# Patient Record
Sex: Female | Born: 1985
Health system: Southern US, Community
[De-identification: ages and names within clinical notes are randomized; demographics above are authoritative.]

## PROBLEM LIST (undated history)

## (undated) ENCOUNTER — Inpatient Hospital Stay (HOSPITAL_COMMUNITY): Payer: Self-pay

## (undated) DIAGNOSIS — F32A Depression, unspecified: Secondary | ICD-10-CM

## (undated) DIAGNOSIS — N12 Tubulo-interstitial nephritis, not specified as acute or chronic: Secondary | ICD-10-CM

## (undated) DIAGNOSIS — E119 Type 2 diabetes mellitus without complications: Secondary | ICD-10-CM

## (undated) DIAGNOSIS — N289 Disorder of kidney and ureter, unspecified: Secondary | ICD-10-CM

## (undated) DIAGNOSIS — I10 Essential (primary) hypertension: Secondary | ICD-10-CM

## (undated) DIAGNOSIS — R569 Unspecified convulsions: Secondary | ICD-10-CM

## (undated) HISTORY — PX: CHOLECYSTECTOMY: SHX55

## (undated) HISTORY — PX: DILATION AND CURETTAGE OF UTERUS: SHX78

---

## 2011-03-31 ENCOUNTER — Encounter (HOSPITAL_COMMUNITY): Payer: Self-pay | Admitting: Emergency Medicine

## 2011-03-31 ENCOUNTER — Emergency Department (HOSPITAL_COMMUNITY)
Admission: EM | Admit: 2011-03-31 | Discharge: 2011-03-31 | Disposition: A | Discharge status: Home / Self Care | Payer: Self-pay | Attending: Emergency Medicine | Admitting: Emergency Medicine

## 2011-03-31 DIAGNOSIS — F17200 Nicotine dependence, unspecified, uncomplicated: Secondary | ICD-10-CM | POA: Insufficient documentation

## 2011-03-31 DIAGNOSIS — K089 Disorder of teeth and supporting structures, unspecified: Secondary | ICD-10-CM | POA: Insufficient documentation

## 2011-03-31 DIAGNOSIS — K0889 Other specified disorders of teeth and supporting structures: Secondary | ICD-10-CM

## 2011-03-31 MED ORDER — KETOROLAC TROMETHAMINE 60 MG/2ML IM SOLN
60.0000 mg | Freq: Once | INTRAMUSCULAR | Status: AC
  Administered 2011-03-31: 60 mg via INTRAMUSCULAR
  Filled 2011-03-31: qty 2

## 2011-03-31 MED ORDER — HYDROMORPHONE HCL PF 1 MG/ML IJ SOLN
1.0000 mg | Freq: Once | INTRAMUSCULAR | Status: AC
  Administered 2011-03-31: 1 mg via INTRAMUSCULAR
  Filled 2011-03-31: qty 1

## 2011-03-31 MED ORDER — NAPROXEN 500 MG PO TABS: 500.0000 mg | ORAL_TABLET | Freq: Two times a day (BID) | ORAL | Status: AC

## 2011-03-31 NOTE — Discharge Instructions (Signed)
Please followup with your dentist as soon as possible, make a phone call in the morning to expedite your followup care. Please take Naprosyn twice a day as prescribed in addition to the Vicodin that you are already taking. If you should develop severe swelling fevers or worsening pain call your dentist or return to the emergency department immediately.

## 2011-03-31 NOTE — ED Provider Notes (Signed)
History     CSN: [id number]  Arrival date & time 03/31/11  0143   First MD Initiated Contact with Patient 03/31/11 0202      Chief Complaint  Patient presents with  . Dental Pain    (Consider location/radiation/quality/duration/timing/severity/associated sxs/prior treatment) HPI Comments: 26 year old female with a history of gradual onset of toothache, this is gradually getting worse over the last 4 days it is located in the right lower jaw, it is tender to palpation over the tooth and has pain with chewing but denies fevers chills nausea or vomiting. She denies any swelling of the jaw. Symptoms are persistent, gradually getting worse  Patient is a 26 y.o. female presenting with tooth pain. The history is provided by the patient and a relative.  Dental PainPrimary symptoms do not include fever or sore throat.  Additional symptoms do not include: facial swelling and trouble swallowing.    History reviewed. No pertinent past medical history.  Past Surgical History  Procedure Date  . Cholecystectomy     History reviewed. No pertinent family history.  History  Substance Use Topics  . Smoking status: Current Everyday Smoker    Types: Cigarettes  . Smokeless tobacco: Not on file  . Alcohol Use: Yes     socially    OB History    Grav Para Term Preterm Abortions TAB SAB Ect Mult Living                  Review of Systems  Constitutional: Negative for fever and chills.  HENT: Positive for dental problem. Negative for sore throat, facial swelling, trouble swallowing and voice change.        Toothache  Gastrointestinal: Negative for nausea and vomiting.    Allergies  Review of patient's allergies indicates no known allergies.  Home Medications   Current Outpatient Rx  Name Route Sig Dispense Refill  . HYDROCODONE-ACETAMINOPHEN 5-325 MG PO TABS Oral Take 1 tablet by mouth every 6 (six) hours as needed. pain    . NORGESTIMATE-ETH ESTRADIOL 0.25-35 MG-MCG PO TABS Oral  Take 1 tablet by mouth daily.    Marland Kitchen PENICILLIN V POTASSIUM 500 MG PO TABS Oral Take 500 mg by mouth 4 (four) times daily. Tooth infection    . NAPROXEN 500 MG PO TABS Oral Take 1 tablet (500 mg total) by mouth 2 (two) times daily with a meal. 30 tablet 0    BP 140/82  Pulse 92  Temp(Src) 98.8 F (37.1 C) (Oral)  Resp 22  SpO2 98%  LMP 03/30/2011  Physical Exam  Nursing note and vitals reviewed. Constitutional: She appears well-developed and well-nourished. No distress.  HENT:  Head: Normocephalic and atraumatic.  Mouth/Throat: Oropharynx is clear and moist. No oropharyngeal exudate.       Dental Disease, scattered minimal dental disease, right lower rear molar with tenderness, fracture, no surrounding abscess drainage or erythema, no tenderness under the tongue, normal appearing tongue and tonsils  Eyes: Conjunctivae are normal. No scleral icterus.  Neck: Normal range of motion. Neck supple. No thyromegaly present.  Cardiovascular: Normal rate and regular rhythm.   Pulmonary/Chest: Effort normal and breath sounds normal.       Phonation normal, no change in voice, no respiratory distress  Lymphadenopathy:    She has no cervical adenopathy.  Neurological: She is alert.  Skin: Skin is warm and dry. No rash noted. She is not diaphoretic.    ED Course  Procedures (including critical care time)  Labs Reviewed - No data  to display No results found.   1. Toothache       MDM  Overall the patient is well appearing, has normal vital signs including oxygen level is 98%, temperature of 37.1 Celsius, blood pressure 140/82.  Patient is already taking penicillin and has followup with the dentist on the 28th of this month.  There is no signs of abscess or anything that needs to be surgically drained or manipulated  Medications given in the emergency department:  Hydromorphone 1 mg intramuscular, Toradol 60 mg intramuscular  Discharge Prescriptions include:  #1  Naprosyn        Vida Roller, MD 03/31/11 [phone]

## 2011-03-31 NOTE — ED Notes (Signed)
Patient reports dental pain in right lower jaw states saw dentist and was given vicodin and Pen VK pain is not improved.

## 2011-03-31 NOTE — ED Notes (Signed)
Patient with toothache for last three days.  Patient states pain is mostly on lower jaw on right.

## 2011-11-13 ENCOUNTER — Encounter (HOSPITAL_COMMUNITY): Payer: Self-pay | Admitting: Emergency Medicine

## 2011-11-13 ENCOUNTER — Emergency Department (HOSPITAL_COMMUNITY)
Admission: EM | Admit: 2011-11-13 | Discharge: 2011-11-13 | Disposition: A | Discharge status: Home / Self Care | Payer: Medicaid Other | Source: Home / Self Care | Attending: Family Medicine | Admitting: Family Medicine

## 2011-11-13 DIAGNOSIS — N912 Amenorrhea, unspecified: Secondary | ICD-10-CM

## 2011-11-13 HISTORY — DX: Unspecified convulsions: R56.9

## 2011-11-13 LAB — POCT URINALYSIS DIP (DEVICE)
Bilirubin Urine: NEGATIVE
Hgb urine dipstick: NEGATIVE
Nitrite: NEGATIVE
Protein, ur: NEGATIVE mg/dL
pH: 6.5 (ref 5.0–8.0)

## 2011-11-13 LAB — WET PREP, GENITAL: Trich, Wet Prep: NONE SEEN

## 2011-11-13 NOTE — ED Provider Notes (Signed)
Medical screening examination/treatment/procedure(s) were performed by resident physician or non-physician practitioner and as supervising physician I was immediately available for consultation/collaboration.   KINDL,JAMES DOUGLAS MD.    James D Kindl, MD 11/13/11 2057 

## 2011-11-13 NOTE — ED Notes (Signed)
Reports no period in 2 months, abdominal cramping, no energy, lethargic, nauseated and vomiting.  Vaginal discharge and odor per patient

## 2011-11-13 NOTE — ED Provider Notes (Signed)
History     CSN: [id number]  Arrival date & time 11/13/11  1158   First MD Initiated Contact with Patient 11/13/11 1247      Chief Complaint  Patient presents with  . Exposure to STD    (Consider location/radiation/quality/duration/timing/severity/associated sxs/prior treatment) HPI 26 yo F here with missed cycle one month ago and two months of intermittent fatigue, nausea, emotional lability, and occasional abd cramps.  Reports occasional (2-3 times per month) vaginal odor but no itching, burning, or vaginal lesions.  Sexually active with one partner (female).  Was on OCPs until 3 months ago but stopped because she was having too much nausea and appetite changes.  Currently using no birth control.  Past Medical History  Diagnosis Date  . Seizures   . Kidney failure     Past Surgical History  Procedure Date  . Cholecystectomy     No family history on file.  History  Substance Use Topics  . Smoking status: Current Every Day Smoker    Types: Cigarettes  . Smokeless tobacco: Not on file  . Alcohol Use: Yes     socially    OB History    Grav Para Term Preterm Abortions TAB SAB Ect Mult Living                  Review of Systems  Constitutional: Negative for fever, chills and appetite change.  Respiratory: Negative for shortness of breath.   Gastrointestinal: Positive for nausea and vomiting.  Genitourinary: Negative for dysuria, flank pain, vaginal bleeding, vaginal discharge and vaginal pain.  Musculoskeletal: Negative for back pain.  Skin: Negative for rash.    Allergies  Review of patient's allergies indicates no known allergies.  Home Medications   Current Outpatient Rx  Name Route Sig Dispense Refill  . HYDROCODONE-ACETAMINOPHEN 5-325 MG PO TABS Oral Take 1 tablet by mouth every 6 (six) hours as needed. pain    . NAPROXEN 500 MG PO TABS Oral Take 1 tablet (500 mg total) by mouth 2 (two) times daily with a meal. 30 tablet 0  . NORGESTIMATE-ETH ESTRADIOL  0.25-35 MG-MCG PO TABS Oral Take 1 tablet by mouth daily.    Marland Kitchen PENICILLIN V POTASSIUM 500 MG PO TABS Oral Take 500 mg by mouth 4 (four) times daily. Tooth infection      BP 152/90  Pulse 92  Temp 98.6 F (37 C) (Oral)  Resp 20  SpO2 98%  LMP 09/25/2011  Physical Exam  Constitutional: She appears well-developed and well-nourished. No distress.       Obese  Genitourinary: Vagina normal and uterus normal. There is no rash, tenderness or lesion on the right labia. There is no rash, tenderness or lesion on the left labia. Cervix exhibits no motion tenderness, no discharge and no friability. Right adnexum displays no mass and no tenderness. Left adnexum displays no mass and no tenderness.    ED Course  Procedures (including critical care time)   Labs Reviewed  POCT URINALYSIS DIP (DEVICE)  POCT PREGNANCY, URINE  WET PREP, GENITAL  GC/CHLAMYDIA PROBE AMP, GENITAL   No results found.   1. Amenorrhea following discontinuation of oral contraceptive use       MDM  Pt with amenorrhea following discontinuation of OCPs.  Due to body habitus and negative urine pregnancy test feel most likely cause is PCOS.  No evidence of STI/PID on exam, no evidence of UTI.  Will recommend f/u with PCP about workup for PCOS if amenorrhea continues.  Will  also recommend discussion about other forms of birth control as long as they are not wishing to have a child.        Brent Bulla, MD 11/13/11 1336

## 2011-11-14 LAB — GC/CHLAMYDIA PROBE AMP, GENITAL
Chlamydia, DNA Probe: NEGATIVE
GC Probe Amp, Genital: NEGATIVE

## 2012-05-22 ENCOUNTER — Other Ambulatory Visit: Payer: Self-pay | Admitting: Obstetrics

## 2012-05-22 ENCOUNTER — Encounter: Payer: Self-pay | Admitting: Obstetrics

## 2012-05-22 ENCOUNTER — Ambulatory Visit (INDEPENDENT_AMBULATORY_CARE_PROVIDER_SITE_OTHER): Payer: Medicaid Other | Admitting: Obstetrics

## 2012-05-22 VITALS — BP 136/89 | HR 94 | Temp 98.2°F | Ht 69.5 in | Wt 280.0 lb

## 2012-05-22 DIAGNOSIS — Z113 Encounter for screening for infections with a predominantly sexual mode of transmission: Secondary | ICD-10-CM

## 2012-05-22 DIAGNOSIS — N760 Acute vaginitis: Secondary | ICD-10-CM

## 2012-05-22 DIAGNOSIS — Z01419 Encounter for gynecological examination (general) (routine) without abnormal findings: Secondary | ICD-10-CM

## 2012-05-22 DIAGNOSIS — Z124 Encounter for screening for malignant neoplasm of cervix: Secondary | ICD-10-CM

## 2012-05-22 DIAGNOSIS — Z3009 Encounter for other general counseling and advice on contraception: Secondary | ICD-10-CM

## 2012-05-22 DIAGNOSIS — IMO0001 Reserved for inherently not codable concepts without codable children: Secondary | ICD-10-CM

## 2012-05-22 DIAGNOSIS — Z0000 Encounter for general adult medical examination without abnormal findings: Secondary | ICD-10-CM

## 2012-05-22 DIAGNOSIS — N926 Irregular menstruation, unspecified: Secondary | ICD-10-CM

## 2012-05-22 LAB — POCT URINE PREGNANCY: Preg Test, Ur: NEGATIVE

## 2012-05-22 NOTE — Progress Notes (Signed)
.   Subjective:     Joanne Waller is a 27 y.o. female here for a routine exam.  Current complaints - irregular cycles.   She would also like to be tested for STD.  Personal health questionnaire reviewed: yes.   Gynecologic History Patient's last menstrual period was 03/23/2012. Contraception: none Last Pap: 2 yrs ago. Results were: normal Last mammogram: N/A  Obstetric History OB History   Grav Para Term Preterm Abortions TAB SAB Ect Mult Living                   The following portions of the patient's history were reviewed and updated as appropriate: allergies, current medications, past family history, past medical history, past social history, past surgical history and problem list.  Review of Systems Pertinent items are noted in HPI.    Objective:    General appearance: alert and no distress Breasts: normal appearance, no masses or tenderness Abdomen: normal findings: soft, non-tender Pelvic: cervix normal in appearance, external genitalia normal, no adnexal masses or tenderness, no cervical motion tenderness, uterus normal size, shape, and consistency and vagina normal without discharge    Assessment:    Healthy female exam.  Wants contraception.   Plan:    Education reviewed: safe sex/STD prevention, self breast exams and conraceptive options.. Contraception: Wants Paediatric nurse. Follow up in: 2 weeks. Start Nuva Ring with abstnence and negative UPT.

## 2012-05-22 NOTE — Addendum Note (Signed)
Addended by: Julaine Hua on: 05/22/2012 05:29 PM   Modules accepted: Orders

## 2012-05-23 LAB — PAP IG W/ RFLX HPV ASCU

## 2012-05-23 LAB — HIV ANTIBODY (ROUTINE TESTING W REFLEX): HIV: NONREACTIVE

## 2012-05-23 LAB — HEPATITIS B SURFACE ANTIGEN: Hepatitis B Surface Ag: NEGATIVE

## 2012-05-23 LAB — HEPATITIS C ANTIBODY: HCV Ab: NEGATIVE

## 2012-05-23 LAB — WET PREP BY MOLECULAR PROBE: Trichomonas vaginosis: NEGATIVE

## 2012-05-26 ENCOUNTER — Other Ambulatory Visit: Payer: Self-pay | Admitting: *Deleted

## 2012-05-26 ENCOUNTER — Encounter: Payer: Self-pay | Admitting: *Deleted

## 2012-05-26 ENCOUNTER — Ambulatory Visit: Payer: Self-pay | Admitting: Obstetrics

## 2012-05-26 DIAGNOSIS — N760 Acute vaginitis: Secondary | ICD-10-CM

## 2012-05-26 DIAGNOSIS — B9689 Other specified bacterial agents as the cause of diseases classified elsewhere: Secondary | ICD-10-CM

## 2012-05-26 MED ORDER — TINIDAZOLE 500 MG PO TABS: 1000.0000 mg | ORAL_TABLET | Freq: Every day | ORAL | Status: DC

## 2012-06-05 ENCOUNTER — Ambulatory Visit: Payer: Medicaid Other | Admitting: Obstetrics

## 2012-06-09 ENCOUNTER — Ambulatory Visit: Payer: Medicaid Other | Admitting: Obstetrics

## 2012-06-11 ENCOUNTER — Ambulatory Visit (INDEPENDENT_AMBULATORY_CARE_PROVIDER_SITE_OTHER): Payer: Medicaid Other | Admitting: Obstetrics

## 2012-06-11 ENCOUNTER — Encounter: Payer: Self-pay | Admitting: Obstetrics

## 2012-06-11 VITALS — BP 114/61 | HR 118 | Temp 98.1°F | Ht 70.0 in | Wt 275.0 lb

## 2012-06-11 DIAGNOSIS — IMO0001 Reserved for inherently not codable concepts without codable children: Secondary | ICD-10-CM

## 2012-06-11 DIAGNOSIS — Z309 Encounter for contraceptive management, unspecified: Secondary | ICD-10-CM

## 2012-06-11 DIAGNOSIS — Z3009 Encounter for other general counseling and advice on contraception: Secondary | ICD-10-CM

## 2012-06-11 MED ORDER — ETONOGESTREL-ETHINYL ESTRADIOL 0.12-0.015 MG/24HR VA RING: VAGINAL_RING | VAGINAL | Status: DC

## 2012-06-11 NOTE — Patient Instructions (Signed)
NuvaRing

## 2012-06-11 NOTE — Progress Notes (Signed)
Subjective:    Joanne Waller is a 27 y.o. female who presents for contraception counseling. The patient has no complaints today. The patient is sexually active. Pertinent past medical history: current smoker. Pt is interested in the Nuvaring.   Menstrual History: OB History   Grav Para Term Preterm Abortions TAB SAB Ect Mult Living                  Menarche age: 33 Irregular cycles  Patient's last menstrual period was 03/23/2012.    The following portions of the patient's history were reviewed and updated as appropriate: allergies, current medications, past family history, past medical history, past social history, past surgical history and problem list.  Review of Systems Pertinent items are noted in HPI.   Objective:    . PE:  NEFG           Vagina normal           Cervix normal           Uterus NSSC           Adnexa negative Patient fitted for 1st Nuva Ring.  She then removed  The Nuva Ring and replaced back in normal position.  Assessment:    27 y.o., starting NuvaRing vaginal inserts, no contraindications.   Plan:    All questions answered.  Nuva Ring Rx

## 2012-11-06 ENCOUNTER — Emergency Department: Payer: Self-pay | Admitting: Internal Medicine

## 2012-11-28 ENCOUNTER — Encounter: Payer: Self-pay | Admitting: Obstetrics

## 2013-01-17 ENCOUNTER — Emergency Department: Payer: Self-pay | Admitting: Internal Medicine

## 2013-01-17 LAB — GC/CHLAMYDIA PROBE AMP

## 2013-06-30 ENCOUNTER — Ambulatory Visit: Payer: Medicaid Other | Admitting: Obstetrics

## 2013-08-08 ENCOUNTER — Emergency Department: Payer: Self-pay | Admitting: Emergency Medicine

## 2013-08-08 LAB — COMPREHENSIVE METABOLIC PANEL
ALK PHOS: 76 U/L
ALT: 48 U/L (ref 12–78)
AST: 29 U/L (ref 15–37)
Albumin: 3.4 g/dL (ref 3.4–5.0)
Anion Gap: 6 — ABNORMAL LOW (ref 7–16)
BILIRUBIN TOTAL: 0.2 mg/dL (ref 0.2–1.0)
BUN: 10 mg/dL (ref 7–18)
CALCIUM: 8.7 mg/dL (ref 8.5–10.1)
CHLORIDE: 106 mmol/L (ref 98–107)
Co2: 26 mmol/L (ref 21–32)
Creatinine: 0.75 mg/dL (ref 0.60–1.30)
EGFR (African American): >60
EGFR (Non-African Amer.): >60
Glucose: 116 mg/dL — ABNORMAL HIGH (ref 65–99)
Osmolality: 276 (ref 275–301)
Potassium: 3.7 mmol/L (ref 3.5–5.1)
Sodium: 138 mmol/L (ref 136–145)
TOTAL PROTEIN: 7.7 g/dL (ref 6.4–8.2)

## 2013-08-08 LAB — CBC WITH DIFFERENTIAL/PLATELET
Basophil #: 0.1 10*3/uL (ref 0.0–0.1)
Basophil %: 0.7 %
EOS ABS: 0.1 10*3/uL (ref 0.0–0.7)
Eosinophil %: 1.5 %
HCT: 39.5 % (ref 35.0–47.0)
HGB: 13.2 g/dL (ref 12.0–16.0)
Lymphocyte #: 3.2 10*3/uL (ref 1.0–3.6)
Lymphocyte %: 34.3 %
MCH: 27 pg (ref 26.0–34.0)
MCHC: 33.4 g/dL (ref 32.0–36.0)
MCV: 81 fL (ref 80–100)
MONO ABS: 0.7 x10 3/mm (ref 0.2–0.9)
Monocyte %: 7.9 %
Neutrophil #: 5.2 10*3/uL (ref 1.4–6.5)
Neutrophil %: 55.6 %
Platelet: 307 10*3/uL (ref 150–440)
RBC: 4.88 10*6/uL (ref 3.80–5.20)
RDW: 13.7 % (ref 11.5–14.5)
WBC: 9.3 10*3/uL (ref 3.6–11.0)

## 2013-08-08 LAB — URINALYSIS, COMPLETE
BILIRUBIN, UR: NEGATIVE
BLOOD: NEGATIVE
Glucose,UR: NEGATIVE mg/dL (ref 0–75)
Ketone: NEGATIVE
LEUKOCYTE ESTERASE: NEGATIVE
NITRITE: NEGATIVE
Ph: 5 (ref 4.5–8.0)
Protein: NEGATIVE
RBC,UR: NONE SEEN /HPF (ref 0–5)
Specific Gravity: 1.026 (ref 1.003–1.030)

## 2013-08-08 LAB — LIPASE, BLOOD: Lipase: 127 U/L (ref 73–393)

## 2013-10-05 ENCOUNTER — Encounter: Payer: Self-pay | Admitting: Obstetrics

## 2013-10-05 ENCOUNTER — Other Ambulatory Visit: Payer: Self-pay | Admitting: Obstetrics

## 2013-10-05 ENCOUNTER — Ambulatory Visit (INDEPENDENT_AMBULATORY_CARE_PROVIDER_SITE_OTHER): Payer: Medicaid Other | Admitting: Obstetrics

## 2013-10-05 VITALS — Temp 98.1°F | Ht 70.0 in | Wt 281.0 lb

## 2013-10-05 DIAGNOSIS — Z0000 Encounter for general adult medical examination without abnormal findings: Secondary | ICD-10-CM

## 2013-10-05 DIAGNOSIS — Z113 Encounter for screening for infections with a predominantly sexual mode of transmission: Secondary | ICD-10-CM

## 2013-10-05 DIAGNOSIS — N926 Irregular menstruation, unspecified: Secondary | ICD-10-CM

## 2013-10-05 LAB — POCT URINE PREGNANCY: PREG TEST UR: NEGATIVE

## 2013-10-05 NOTE — Progress Notes (Signed)
Subjective:     Joanne Waller is a 28 y.o. female here for a routine exam.  Current complaints: None.    Personal health questionnaire:  Is patient Ashkenazi Jewish, have a family history of breast and/or ovarian cancer: no Is there a family history of uterine cancer diagnosed at age < 40, gastrointestinal cancer, urinary tract cancer, family member who is a Personnel officer syndrome-associated carrier: no Is the patient overweight and hypertensive, family history of diabetes, personal history of gestational diabetes or PCOS: no Is patient over 26, have PCOS,  family history of premature CHD under age 13, diabetes, smoke, have hypertension or peripheral artery disease:  no At any time, has a partner hit, kicked or otherwise hurt or frightened you?: no Over the past 2 weeks, have you felt down, depressed or hopeless?: no Over the past 2 weeks, have you felt little interest or pleasure in doing things?:no   Gynecologic History No LMP recorded. Contraception: none Last Pap: 2014. Results were: normal Last mammogram: n/a. Results were: n/a  Obstetric History OB History  Gravida Para Term Preterm AB SAB TAB Ectopic Multiple Living  # Outcome Date GA Lbr Len/2nd Weight Sex Delivery Anes PTL Lv  2 SAB         N  1 TRM         Y      Past Medical History  Diagnosis Date  . Seizures   . Kidney failure     Past Surgical History  Procedure Laterality Date  . Cholecystectomy      Current outpatient prescriptions:divalproex (DEPAKOTE) 500 MG DR tablet, Take 500 mg by mouth daily., Disp: , Rfl:  Allergies  Allergen Reactions  . Latex Itching, Swelling and Rash    History  Substance Use Topics  . Smoking status: Current Every Day Smoker -- 0.50 packs/day for 7 years    Types: Cigarettes  . Smokeless tobacco: Never Used  . Alcohol Use: No     Comment: socially    Family History  Problem Relation Age of Onset  . Kidney disease Mother   . Hypertension Mother   . Sickle  cell anemia Father   . Hyperlipidemia Maternal Grandmother       Review of Systems  Constitutional: negative for fatigue and weight loss Respiratory: negative for cough and wheezing Cardiovascular: negative for chest pain, fatigue and palpitations Gastrointestinal: negative for abdominal pain and change in bowel habits Musculoskeletal:negative for myalgias Neurological: negative for gait problems and tremors Behavioral/Psych: negative for abusive relationship, depression Endocrine: negative for temperature intolerance   Genitourinary:negative for abnormal menstrual periods, genital lesions, hot flashes, sexual problems and vaginal discharge Integument/breast: negative for breast lump, breast tenderness, nipple discharge and skin lesion(s)    Objective:       Temp(Src) 98.1 F (36.7 C)  Ht  (1.778 m)  Wt 281 lb (127.461 kg)  BMI 40.32 kg/m2 General:   alert  Skin:   no rash or abnormalities  Lungs:   clear to auscultation bilaterally  Heart:   regular rate and rhythm, S1, S2 normal, no murmur, click, rub or gallop  Breasts:   normal without suspicious masses, skin or nipple changes or axillary nodes  Abdomen:  normal findings: no organomegaly, soft, non-tender and no hernia  Pelvis:  External genitalia: normal general appearance Urinary system: urethral meatus normal and bladder without fullness, nontender Vaginal: normal without tenderness, induration or masses Cervix: normal  appearance Adnexa: normal bimanual exam Uterus: anteverted and non-tender, normal size   Lab Review Urine pregnancy test Labs reviewed yes Radiologic studies reviewed yes    Assessment:    Healthy female exam.    Plan:    F/U 1 year.  No orders of the defined types were placed in this encounter.   Orders Placed This Encounter  Procedures  . WET PREP BY MOLECULAR PROBE  . GC/Chlamydia Probe Amp  . HIV antibody  . Hepatitis B surface antigen  . RPR  . Hepatitis C antibody  .  CBC with Differential  . Comprehensive metabolic panel  . TSH  . HgB A1c  . POCT urine pregnancy

## 2013-10-06 ENCOUNTER — Telehealth: Payer: Self-pay | Admitting: *Deleted

## 2013-10-06 LAB — COMPREHENSIVE METABOLIC PANEL
ALBUMIN: 3.9 g/dL (ref 3.5–5.2)
ALT: 37 U/L — AB (ref 0–35)
AST: 20 U/L (ref 0–37)
Alkaline Phosphatase: 65 U/L (ref 39–117)
BILIRUBIN TOTAL: 0.3 mg/dL (ref 0.2–1.2)
BUN: 9 mg/dL (ref 6–23)
CHLORIDE: 101 meq/L (ref 96–112)
CO2: 27 mEq/L (ref 19–32)
CREATININE: 0.66 mg/dL (ref 0.50–1.10)
Calcium: 9.5 mg/dL (ref 8.4–10.5)
Glucose, Bld: 114 mg/dL — ABNORMAL HIGH (ref 70–99)
Potassium: 4.5 mEq/L (ref 3.5–5.3)
Sodium: 137 mEq/L (ref 135–145)
Total Protein: 7.2 g/dL (ref 6.0–8.3)

## 2013-10-06 LAB — CBC WITH DIFFERENTIAL/PLATELET
BASOS ABS: 0 10*3/uL (ref 0.0–0.1)
Basophils Relative: 0 % (ref 0–1)
EOS ABS: 0.1 10*3/uL (ref 0.0–0.7)
EOS PCT: 2 % (ref 0–5)
HEMATOCRIT: 39.8 % (ref 36.0–46.0)
Hemoglobin: 13.1 g/dL (ref 12.0–15.0)
LYMPHS PCT: 44 % (ref 12–46)
Lymphs Abs: 3.1 10*3/uL (ref 0.7–4.0)
MCH: 25.8 pg — AB (ref 26.0–34.0)
MCHC: 32.9 g/dL (ref 30.0–36.0)
MCV: 78.3 fL (ref 78.0–100.0)
MONO ABS: 0.4 10*3/uL (ref 0.1–1.0)
Monocytes Relative: 6 % (ref 3–12)
Neutro Abs: 3.4 10*3/uL (ref 1.7–7.7)
Neutrophils Relative %: 48 % (ref 43–77)
PLATELETS: 360 10*3/uL (ref 150–400)
RBC: 5.08 MIL/uL (ref 3.87–5.11)
RDW: 13.8 % (ref 11.5–15.5)
WBC: 7.1 10*3/uL (ref 4.0–10.5)

## 2013-10-06 LAB — THYROID PROFILE - CHCC
Free Thyroxine Index: 2.6 (ref 1.4–3.8)
T3 Uptake: 27 % (ref 22.0–35.0)
T4, Total: 9.5 ug/dL (ref 4.5–12.0)

## 2013-10-06 LAB — HIV ANTIBODY (ROUTINE TESTING W REFLEX): HIV: NONREACTIVE

## 2013-10-06 LAB — PAP IG W/ RFLX HPV ASCU

## 2013-10-06 LAB — HEPATITIS B SURFACE ANTIGEN: HEP B S AG: NEGATIVE

## 2013-10-06 LAB — WET PREP BY MOLECULAR PROBE
Candida species: NEGATIVE
GARDNERELLA VAGINALIS: NEGATIVE
Trichomonas vaginosis: NEGATIVE

## 2013-10-06 LAB — HEMOGLOBIN A1C
HEMOGLOBIN A1C: 7.9 % — AB (ref <5.7)
Mean Plasma Glucose: 180 mg/dL — ABNORMAL HIGH (ref <117)

## 2013-10-06 LAB — GC/CHLAMYDIA PROBE AMP
CT Probe RNA: NEGATIVE
GC PROBE AMP APTIMA: NEGATIVE

## 2013-10-06 LAB — RPR

## 2013-10-06 LAB — HEPATITIS C ANTIBODY: HCV Ab: NEGATIVE

## 2013-10-06 LAB — TSH: TSH: 0.336 u[IU]/mL — ABNORMAL LOW (ref 0.350–4.500)

## 2013-10-06 NOTE — Telephone Encounter (Signed)
Patient is calling for her lab results.  

## 2013-10-11 DIAGNOSIS — R030 Elevated blood-pressure reading, without diagnosis of hypertension: Secondary | ICD-10-CM | POA: Insufficient documentation

## 2013-10-11 DIAGNOSIS — K089 Disorder of teeth and supporting structures, unspecified: Secondary | ICD-10-CM | POA: Diagnosis not present

## 2013-10-11 DIAGNOSIS — Z91040 Latex allergy status: Secondary | ICD-10-CM | POA: Insufficient documentation

## 2013-10-11 DIAGNOSIS — Z880 Allergy status to penicillin: Secondary | ICD-10-CM | POA: Insufficient documentation

## 2013-10-11 DIAGNOSIS — F17200 Nicotine dependence, unspecified, uncomplicated: Secondary | ICD-10-CM | POA: Diagnosis not present

## 2013-10-11 DIAGNOSIS — R51 Headache: Secondary | ICD-10-CM | POA: Insufficient documentation

## 2013-10-11 DIAGNOSIS — Z792 Long term (current) use of antibiotics: Secondary | ICD-10-CM | POA: Diagnosis not present

## 2013-10-11 DIAGNOSIS — Z87448 Personal history of other diseases of urinary system: Secondary | ICD-10-CM | POA: Diagnosis not present

## 2013-10-11 DIAGNOSIS — Z791 Long term (current) use of non-steroidal anti-inflammatories (NSAID): Secondary | ICD-10-CM | POA: Diagnosis not present

## 2013-10-11 NOTE — ED Notes (Signed)
Pt states her BP has been up for months and she has a bad headache tonight.  Pt also c/o toothache x a couple of days

## 2013-10-11 NOTE — ED Notes (Signed)
Pt anxious states she took her BP at home using her mother's machine

## 2013-10-12 ENCOUNTER — Emergency Department (HOSPITAL_COMMUNITY)
Admission: EM | Admit: 2013-10-12 | Discharge: 2013-10-12 | Disposition: A | Discharge status: Home / Self Care | Payer: Medicaid Other | Attending: Emergency Medicine | Admitting: Emergency Medicine

## 2013-10-12 DIAGNOSIS — K0889 Other specified disorders of teeth and supporting structures: Secondary | ICD-10-CM

## 2013-10-12 DIAGNOSIS — R030 Elevated blood-pressure reading, without diagnosis of hypertension: Secondary | ICD-10-CM

## 2013-10-12 DIAGNOSIS — R51 Headache: Secondary | ICD-10-CM

## 2013-10-12 DIAGNOSIS — R519 Headache, unspecified: Secondary | ICD-10-CM

## 2013-10-12 MED ORDER — NAPROXEN 375 MG PO TABS: 375.0000 mg | ORAL_TABLET | Freq: Two times a day (BID) | ORAL | Status: DC

## 2013-10-12 MED ORDER — IBUPROFEN 400 MG PO TABS
600.0000 mg | ORAL_TABLET | Freq: Once | ORAL | Status: AC
  Administered 2013-10-12: 600 mg via ORAL
  Filled 2013-10-12 (×2): qty 1

## 2013-10-12 MED ORDER — CLINDAMYCIN HCL 150 MG PO CAPS: 150.0000 mg | ORAL_CAPSULE | Freq: Three times a day (TID) | ORAL | Status: DC

## 2013-10-12 MED ORDER — ACETAMINOPHEN 325 MG PO TABS
650.0000 mg | ORAL_TABLET | Freq: Once | ORAL | Status: AC
  Administered 2013-10-12: 650 mg via ORAL
  Filled 2013-10-12: qty 2

## 2013-10-12 NOTE — ED Provider Notes (Signed)
CSN: [id number]     Arrival date & time 10/11/13  2142 History   First MD Initiated Contact with Patient 10/12/13 [phone]     Chief Complaint  Patient presents with  . Headache  . Hypertension     (Consider location/radiation/quality/duration/timing/severity/associated sxs/prior Treatment) HPI Comments: 28 year old female with smoking history, no other significant medical problems, family history of high blood pressure presents with elevated blood pressure and headache. Patient had gradual onset headache similar to previous, frontal in origin. No head injury, neck stiffness or fevers. Patient has had mild dental pain worsening the past 2-3 days, similar to previous, patient recently had a tooth extracted and requires another were extracted. Patient has dental followup. No swelling in the face or fevers. Patient has had intermittent elevated blood pressure sometimes with headache and sometimes without. Patient is not on any medicines currently and has appointment this week to discuss further. Patient's blood pressure was 170 by her mother's machine this evening.  Patient is a 28 y.o. female presenting with headaches and hypertension. The history is provided by the patient.  Headache Associated symptoms: no abdominal pain, no back pain, no congestion, no fever, no neck pain, no neck stiffness, no numbness and no vomiting   Hypertension Associated symptoms include headaches. Pertinent negatives include no chest pain, no abdominal pain and no shortness of breath.    Past Medical History  Diagnosis Date  . Seizures   . Kidney failure    Past Surgical History  Procedure Laterality Date  . Cholecystectomy     Family History  Problem Relation Age of Onset  . Kidney disease Mother   . Hypertension Mother   . Sickle cell anemia Father   . Hyperlipidemia Maternal Grandmother    History  Substance Use Topics  . Smoking status: Current Every Day Smoker -- 0.50 packs/day for 7 years    Types:  Cigarettes  . Smokeless tobacco: Never Used  . Alcohol Use: No     Comment: socially   OB History   Grav Para Term Preterm Abortions TAB SAB Ect Mult Living   Review of Systems  Constitutional: Negative for fever and chills.  HENT: Positive for dental problem. Negative for congestion and drooling.   Eyes: Negative for visual disturbance.  Respiratory: Negative for shortness of breath.   Cardiovascular: Negative for chest pain.  Gastrointestinal: Negative for vomiting and abdominal pain.  Genitourinary: Negative for dysuria and flank pain.  Musculoskeletal: Negative for back pain, neck pain and neck stiffness.  Skin: Negative for rash.  Neurological: Positive for headaches. Negative for syncope, weakness, light-headedness and numbness.      Allergies  Penicillins and Latex  Home Medications   Prior to Admission medications   Medication Sig Start Date End Date Taking? Authorizing Provider  acetaminophen (TYLENOL) 325 MG tablet Take 1,300 mg by mouth every 6 (six) hours as needed for mild pain.   Yes Historical Provider, MD  ibuprofen (ADVIL,MOTRIN) 200 MG tablet Take 800 mg by mouth every 6 (six) hours as needed for moderate pain.   Yes Historical Provider, MD  naproxen sodium (ANAPROX) 220 MG tablet Take 440 mg by mouth 2 (two) times daily as needed (pain).   Yes Historical Provider, MD  penicillin v potassium (VEETID) 500 MG tablet Take 500 mg by mouth 3 (three) times daily.    Historical Provider, MD   BP 132/76  Pulse 78  Temp(Src) 98.8 F (  37.1 C) (Oral)  Resp 14  Ht  (1.778 m)  Wt 281 lb (127.461 kg)  BMI 40.32 kg/m2  SpO2 97%  LMP 08/12/2013 Physical Exam  Nursing note and vitals reviewed. Constitutional: She is oriented to person, place, and time. She appears well-developed and well-nourished.  HENT:  Head: Normocephalic and atraumatic.  Mild gingival tenderness and tenderness at empty socket left upper, no discharge, swelling or  bleeding. No trismus. No abscess appreciated. Neck supple no meningismus  Eyes: Conjunctivae are normal. Right eye exhibits no discharge. Left eye exhibits no discharge.  Neck: Normal range of motion. Neck supple. No tracheal deviation present.  Cardiovascular: Normal rate and regular rhythm.   Pulmonary/Chest: Effort normal and breath sounds normal.  Abdominal: Soft. She exhibits no distension. There is no tenderness. There is no guarding.  Musculoskeletal: She exhibits no edema.  Neurological: She is alert and oriented to person, place, and time.  5+ strength in UE and LE with f/e at major joints. Sensation to palpation intact in UE and LE. CNs 2-12 grossly intact.  EOMFI.  PERRL.   Finger nose and coordination intact bilateral.   Visual fields intact to finger testing.   Skin: Skin is warm. No rash noted.  Psychiatric: She has a normal mood and affect.    ED Course  Procedures (including critical care time) Labs Review Labs Reviewed - No data to display  Imaging Review No results found.   EKG Interpretation None      MDM   Final diagnoses:  Headache, unspecified headache type  Temporary high blood pressure   Intermittent high blood pressure episodes usually associated with headache. Discussed likely blood pressure elevated due to pain however with family history high blood pressure patient need close followup indicate a log when she's not in pain and not stress. Blood pressure 1:30 systolic. Patient has no signs or symptoms of endorgan damage. Discussed improving diet and exercise and watching salt intake. Dental pain no emergent infection visualized, plan for oral antibiotics and followup with dentist. Headache improved with time, meds given in ER. Normal neuro exam. Gradual onset no red flags  The patients results and plan were reviewed and discussed.   Any x-rays performed were personally reviewed by myself.   Differential diagnosis were considered with the  presenting HPI.  Medications  ibuprofen (ADVIL,MOTRIN) tablet 600 mg (600 mg Oral Given 10/12/13 0139)  acetaminophen (TYLENOL) tablet 650 mg (650 mg Oral Given 10/12/13 0139)     Filed Vitals:   10/11/13 2210 10/12/13 0046 10/12/13 0100  BP: 134/94 135/82 132/76  Pulse: 102 88 78  Temp: 98.8 F (37.1 C)    TempSrc: Oral    Resp: 16 14   Height:  (1.778 m)    Weight: 281 lb (127.461 kg)    SpO2: 99% 99% 97%    Admission/ observation were discussed with the admitting physician, patient and/or family and they are comfortable with the plan.    Enid Skeens, MD 10/12/13 [phone]

## 2013-10-12 NOTE — Discharge Instructions (Signed)
If you were given medicines take as directed.  If you are on coumadin or contraceptives realize their levels and effectiveness is altered by many different medicines.  If you have any reaction (rash, tongues swelling, other) to the medicines stop taking and see a physician.    improve dietary intake and exercise as discussed. Follow closely with her primary Dr. and keep a log of your blood pressure when you're not stressed or in pain. Please follow up as directed and return to the ER or see a physician for new or worsening symptoms.  Thank you. Filed Vitals:   10/11/13 2210 10/12/13 0046 10/12/13 0100  BP: 134/94 135/82 132/76  Pulse: 102 88 78  Temp: 98.8 F (37.1 C)    TempSrc: Oral    Resp: 16 14   Height:  (1.778 m)    Weight: 281 lb (127.461 kg)    SpO2: 99% 99% 97%   See your dentist.

## 2013-10-18 ENCOUNTER — Emergency Department: Payer: Self-pay | Admitting: Emergency Medicine

## 2013-12-07 ENCOUNTER — Encounter: Payer: Self-pay | Admitting: Obstetrics

## 2014-01-27 ENCOUNTER — Emergency Department: Payer: Self-pay | Admitting: Emergency Medicine

## 2014-01-27 LAB — COMPREHENSIVE METABOLIC PANEL
Albumin: 2.9 g/dL — ABNORMAL LOW (ref 3.4–5.0)
Alkaline Phosphatase: 63 U/L
Anion Gap: 7 (ref 7–16)
BUN: 8 mg/dL (ref 7–18)
Bilirubin,Total: 0.3 mg/dL (ref 0.2–1.0)
CHLORIDE: 110 mmol/L — AB (ref 98–107)
Calcium, Total: 8.3 mg/dL — ABNORMAL LOW (ref 8.5–10.1)
Co2: 26 mmol/L (ref 21–32)
Creatinine: 0.76 mg/dL (ref 0.60–1.30)
EGFR (African American): >60
Glucose: 124 mg/dL — ABNORMAL HIGH (ref 65–99)
OSMOLALITY: 285 (ref 275–301)
POTASSIUM: 3.7 mmol/L (ref 3.5–5.1)
SGOT(AST): 28 U/L (ref 15–37)
SGPT (ALT): 51 U/L
Sodium: 143 mmol/L (ref 136–145)
Total Protein: 7.1 g/dL (ref 6.4–8.2)

## 2014-01-27 LAB — CBC WITH DIFFERENTIAL/PLATELET
Basophil #: 0.1 10*3/uL (ref 0.0–0.1)
Basophil %: 0.9 %
Eosinophil #: 0 10*3/uL (ref 0.0–0.7)
Eosinophil %: 0.6 %
HCT: 35.3 % (ref 35.0–47.0)
HGB: 11.4 g/dL — AB (ref 12.0–16.0)
Lymphocyte #: 1.9 10*3/uL (ref 1.0–3.6)
Lymphocyte %: 27.2 %
MCH: 26.2 pg (ref 26.0–34.0)
MCHC: 32.3 g/dL (ref 32.0–36.0)
MCV: 81 fL (ref 80–100)
MONOS PCT: 6.9 %
Monocyte #: 0.5 x10 3/mm (ref 0.2–0.9)
NEUTROS PCT: 64.4 %
Neutrophil #: 4.5 10*3/uL (ref 1.4–6.5)
Platelet: 343 10*3/uL (ref 150–440)
RBC: 4.34 10*6/uL (ref 3.80–5.20)
RDW: 14.2 % (ref 11.5–14.5)
WBC: 7 10*3/uL (ref 3.6–11.0)

## 2014-01-27 LAB — URINALYSIS, COMPLETE
BACTERIA: NONE SEEN
BILIRUBIN, UR: NEGATIVE
Blood: NEGATIVE
Glucose,UR: NEGATIVE mg/dL (ref 0–75)
Ketone: NEGATIVE
LEUKOCYTE ESTERASE: NEGATIVE
NITRITE: NEGATIVE
Ph: 6 (ref 4.5–8.0)
Protein: 30
Specific Gravity: 1.027 (ref 1.003–1.030)
WBC UR: 4 /HPF (ref 0–5)

## 2014-01-27 LAB — PREGNANCY, URINE: PREGNANCY TEST, URINE: NEGATIVE m[IU]/mL

## 2014-01-27 LAB — LIPASE, BLOOD: LIPASE: 186 U/L (ref 73–393)

## 2014-01-27 LAB — ETHANOL

## 2014-09-16 ENCOUNTER — Emergency Department (HOSPITAL_COMMUNITY): Payer: Medicaid Other

## 2014-09-16 ENCOUNTER — Encounter (HOSPITAL_COMMUNITY): Payer: Self-pay | Admitting: Emergency Medicine

## 2014-09-16 ENCOUNTER — Emergency Department (HOSPITAL_COMMUNITY)
Admission: EM | Admit: 2014-09-16 | Discharge: 2014-09-16 | Disposition: A | Discharge status: Home / Self Care | Payer: Medicaid Other | Attending: Emergency Medicine | Admitting: Emergency Medicine

## 2014-09-16 DIAGNOSIS — S199XXA Unspecified injury of neck, initial encounter: Secondary | ICD-10-CM | POA: Diagnosis not present

## 2014-09-16 DIAGNOSIS — Z87448 Personal history of other diseases of urinary system: Secondary | ICD-10-CM | POA: Insufficient documentation

## 2014-09-16 DIAGNOSIS — E119 Type 2 diabetes mellitus without complications: Secondary | ICD-10-CM | POA: Diagnosis not present

## 2014-09-16 DIAGNOSIS — Z720 Tobacco use: Secondary | ICD-10-CM | POA: Diagnosis not present

## 2014-09-16 DIAGNOSIS — M545 Low back pain: Secondary | ICD-10-CM

## 2014-09-16 DIAGNOSIS — S0990XA Unspecified injury of head, initial encounter: Secondary | ICD-10-CM | POA: Insufficient documentation

## 2014-09-16 DIAGNOSIS — Z880 Allergy status to penicillin: Secondary | ICD-10-CM | POA: Diagnosis not present

## 2014-09-16 DIAGNOSIS — M542 Cervicalgia: Secondary | ICD-10-CM

## 2014-09-16 DIAGNOSIS — Y92410 Unspecified street and highway as the place of occurrence of the external cause: Secondary | ICD-10-CM | POA: Diagnosis not present

## 2014-09-16 DIAGNOSIS — Z3202 Encounter for pregnancy test, result negative: Secondary | ICD-10-CM | POA: Insufficient documentation

## 2014-09-16 DIAGNOSIS — Y9389 Activity, other specified: Secondary | ICD-10-CM | POA: Insufficient documentation

## 2014-09-16 DIAGNOSIS — S3992XA Unspecified injury of lower back, initial encounter: Secondary | ICD-10-CM | POA: Insufficient documentation

## 2014-09-16 DIAGNOSIS — I10 Essential (primary) hypertension: Secondary | ICD-10-CM | POA: Insufficient documentation

## 2014-09-16 DIAGNOSIS — Z91040 Latex allergy status: Secondary | ICD-10-CM | POA: Diagnosis not present

## 2014-09-16 DIAGNOSIS — Y998 Other external cause status: Secondary | ICD-10-CM | POA: Insufficient documentation

## 2014-09-16 DIAGNOSIS — E669 Obesity, unspecified: Secondary | ICD-10-CM | POA: Diagnosis not present

## 2014-09-16 HISTORY — DX: Essential (primary) hypertension: I10

## 2014-09-16 HISTORY — DX: Type 2 diabetes mellitus without complications: E11.9

## 2014-09-16 LAB — POC URINE PREG, ED: Preg Test, Ur: NEGATIVE

## 2014-09-16 MED ORDER — METHOCARBAMOL 500 MG PO TABS: 500.0000 mg | ORAL_TABLET | Freq: Two times a day (BID) | ORAL | Status: DC | PRN

## 2014-09-16 MED ORDER — ACETAMINOPHEN 325 MG PO TABS
650.0000 mg | ORAL_TABLET | Freq: Once | ORAL | Status: AC
  Administered 2014-09-16: 650 mg via ORAL
  Filled 2014-09-16: qty 2

## 2014-09-16 MED ORDER — NAPROXEN 250 MG PO TABS: 250.0000 mg | ORAL_TABLET | Freq: Two times a day (BID) | ORAL | Status: DC

## 2014-09-16 MED ORDER — METHOCARBAMOL 500 MG PO TABS
500.0000 mg | ORAL_TABLET | Freq: Once | ORAL | Status: AC
  Administered 2014-09-16: 500 mg via ORAL
  Filled 2014-09-16: qty 1
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
## 2015-01-02 ENCOUNTER — Encounter (HOSPITAL_COMMUNITY): Payer: Self-pay | Admitting: Emergency Medicine

## 2015-01-02 ENCOUNTER — Emergency Department (INDEPENDENT_AMBULATORY_CARE_PROVIDER_SITE_OTHER)
Admission: EM | Admit: 2015-01-02 | Discharge: 2015-01-02 | Disposition: A | Discharge status: Home / Self Care | Payer: Medicaid Other | Source: Home / Self Care | Attending: Emergency Medicine | Admitting: Emergency Medicine

## 2015-01-02 DIAGNOSIS — R6889 Other general symptoms and signs: Secondary | ICD-10-CM

## 2015-01-02 LAB — POCT RAPID STREP A: STREPTOCOCCUS, GROUP A SCREEN (DIRECT): NEGATIVE

## 2015-01-02 MED ORDER — ONDANSETRON HCL 4 MG PO TABS: 4.0000 mg | ORAL_TABLET | Freq: Three times a day (TID) | ORAL | Status: DC | PRN

## 2015-01-02 MED ORDER — FLUTICASONE PROPIONATE 50 MCG/ACT NA SUSP: 2.0000 | Freq: Every day | NASAL | Status: DC

## 2015-01-02 MED ORDER — CETIRIZINE HCL 10 MG PO TABS
10.0000 mg | ORAL_TABLET | Freq: Every day | ORAL | Status: DC
Start: 2015-01-02 — End: 2016-03-20

## 2015-01-02 NOTE — Discharge Instructions (Signed)
You likely have the flu. Takes cetirizine daily to help with the drainage and congestion. Use Flonase daily to help with the drainage and congestion. You can use over-the-counter Afrin for 3 days only to help with the congestion. Take Tylenol or ibuprofen as needed for body aches. Make sure you are drinking plenty of fluids. You should start to feel better over the next 2-3 days. If things are getting worse, please come back.

## 2015-01-02 NOTE — ED Provider Notes (Signed)
CSN: [id number]     Arrival date & time 01/02/15  1856 History   First MD Initiated Contact with Patient 01/02/15 1934     Chief Complaint  Patient presents with  . URI   (Consider location/radiation/quality/duration/timing/severity/associated sxs/prior Treatment) HPI  She is a 29 year old woman here for evaluation of nasal congestion. She states her symptoms started 3 days ago with a headache and sore throat. She quickly developed nasal congestion, rhinorrhea, postnasal drainage. She also reports fevers and chills. She has had body aches. She reports nausea and vomiting. She reports a mild cough. She has been taking DayQuil and NyQuil without improvement.  Past Medical History  Diagnosis Date  . Seizures (HCC)   . Kidney failure   . Diabetes mellitus without complication (HCC)   . Hypertension    Past Surgical History  Procedure Laterality Date  . Cholecystectomy     Family History  Problem Relation Age of Onset  . Kidney disease Mother   . Hypertension Mother   . Sickle cell anemia Father   . Hyperlipidemia Maternal Grandmother    Social History  Substance Use Topics  . Smoking status: Current Every Day Smoker -- 0.50 packs/day for 7 years    Types: Cigarettes  . Smokeless tobacco: Never Used  . Alcohol Use: No     Comment: socially   OB History    Gravida Para Term Preterm AB TAB SAB Ectopic Multiple Living   2 1 1  1  1   1      Review of Systems As in history of present illness Allergies  Penicillins and Latex  Home Medications   Prior to Admission medications   Medication Sig Start Date End Date Taking? Authorizing Provider  HYDROCHLOROTHIAZIDE PO Take by mouth.   Yes Historical Provider, MD  metFORMIN (GLUCOPHAGE) 500 MG tablet Take by mouth 2 (two) times daily with a meal.   Yes Historical Provider, MD  acetaminophen (TYLENOL) 325 MG tablet Take 1,300 mg by mouth every 6 (six) hours as needed for mild pain.    Historical Provider, MD  cetirizine (ZYRTEC)  10 MG tablet Take 1 tablet (10 mg total) by mouth daily. 01/02/15   Charm RingsErin J Mulan Adan, MD  fluticasone (FLONASE) 50 MCG/ACT nasal spray Place 2 sprays into both nostrils daily. 01/02/15   Charm RingsErin J Suellen Durocher, MD  methocarbamol (ROBAXIN) 500 MG tablet Take 1 tablet (500 mg total) by mouth 2 (two) times daily as needed for muscle spasms. 09/16/14   Everlene FarrierWilliam Dansie, PA-C  naproxen (NAPROSYN) 250 MG tablet Take 1 tablet (250 mg total) by mouth 2 (two) times daily with a meal. 09/16/14   Everlene FarrierWilliam Dansie, PA-C   Meds Ordered and Administered this Visit  Medications - No data to display  BP 142/82 mmHg  Pulse 109  Temp(Src) 97.8 F (36.6 C) (Oral)  Resp 18  SpO2 96%  LMP 12/12/2014 No data found.   Physical Exam  Constitutional: She is oriented to person, place, and time. She appears well-developed and well-nourished. No distress.  HENT:  Mouth/Throat: No oropharyngeal exudate.  Oropharynx is erythematous. Nasal mucosa is erythematous and boggy. There is discharge present. TMs normal bilaterally.  Neck: Neck supple.  Cardiovascular: Regular rhythm and normal heart sounds.   No murmur heard. Mild tachycardia  Pulmonary/Chest: Effort normal and breath sounds normal. No respiratory distress. She has no wheezes. She has no rales.  Lymphadenopathy:    She has no cervical adenopathy.  Neurological: She is alert and oriented to person, place,  and time.    ED Course  Procedures (including critical care time)  Labs Review Labs Reviewed  POCT RAPID STREP A    Imaging Review No results found.    MDM   1. Flu-like symptoms    She is outside the window for Tamiflu. Symptomatic treatment with cetirizine, Flonase, and Zofran. Recommended over-the-counter Afrin for no more than 3 days. Return precautions reviewed.    Charm Rings, MD 01/02/15 [phone]

## 2015-01-02 NOTE — ED Notes (Signed)
C/o cold sx onset 4 days Sx include: HA, ST, abd pain emesis, CP, congestion, dyspnea and fevers Taking OTC cold meds w/no relief A&O x4.... No acute distress.

## 2015-01-04 LAB — CULTURE, GROUP A STREP: Strep A Culture: POSITIVE — AB

## 2015-01-04 NOTE — ED Notes (Signed)
Final report of strep testing positive for group A strep, not treated. Message from Dr [facility]iedad [facility]imesHonig [facility], [facility] mg , PO, BID x 10 days .discussed w patient, and will call to [facility], on [address] at [address]. Spoke directly w pharmacy staff

## 2015-01-05 MED ORDER — AZITHROMYCIN 250 MG PO TABS: ORAL_TABLET | ORAL | Status: DC

## 2015-02-23 ENCOUNTER — Ambulatory Visit: Payer: Medicaid Other | Admitting: Obstetrics

## 2015-03-04 ENCOUNTER — Encounter (HOSPITAL_COMMUNITY): Payer: Self-pay | Admitting: Emergency Medicine

## 2015-03-04 ENCOUNTER — Emergency Department (HOSPITAL_COMMUNITY)
Admission: EM | Admit: 2015-03-04 | Discharge: 2015-03-04 | Disposition: A | Discharge status: Home / Self Care | Payer: Medicaid Other | Source: Home / Self Care | Attending: Family Medicine | Admitting: Family Medicine

## 2015-03-04 DIAGNOSIS — K0889 Other specified disorders of teeth and supporting structures: Secondary | ICD-10-CM | POA: Diagnosis not present

## 2015-03-04 MED ORDER — CLINDAMYCIN HCL 300 MG PO CAPS: 300.0000 mg | ORAL_CAPSULE | Freq: Three times a day (TID) | ORAL | Status: DC

## 2015-03-04 MED ORDER — HYDROCODONE-ACETAMINOPHEN 5-325 MG PO TABS: 1.0000 | ORAL_TABLET | ORAL | Status: DC | PRN

## 2015-03-04 NOTE — ED Provider Notes (Signed)
CSN: [id number]     Arrival date & time 03/04/15  1758 History   First MD Initiated Contact with Patient 03/04/15 1838     Chief Complaint  Patient presents with  . Dental Pain   (Consider location/radiation/quality/duration/timing/severity/associated sxs/prior Treatment) HPI Comments: Toothache for 1 day. Located in left upper 3rd molar. Pain radiates into posterior jaw. No trauma.St has appt with dentist in 4 d.  Patient is a 30 y.o. female presenting with tooth pain.  Dental Pain Associated symptoms: no congestion and no fever     Past Medical History  Diagnosis Date  . Seizures (HCC)   . Kidney failure   . Diabetes mellitus without complication (HCC)   . Hypertension    Past Surgical History  Procedure Laterality Date  . Cholecystectomy     Family History  Problem Relation Age of Onset  . Kidney disease Mother   . Hypertension Mother   . Sickle cell anemia Father   . Hyperlipidemia Maternal Grandmother    Social History  Substance Use Topics  . Smoking status: Current Every Day Smoker -- 0.50 packs/day for 7 years    Types: Cigarettes  . Smokeless tobacco: Never Used  . Alcohol Use: No     Comment: socially   OB History    Gravida Para Term Preterm AB TAB SAB Ectopic Multiple Living   Review of Systems  Constitutional: Negative for fever and activity change.  HENT: Positive for dental problem. Negative for congestion, ear pain and sore throat.   Eyes: Negative.   Respiratory: Negative.   Neurological: Negative.   All other systems reviewed and are negative.   Allergies  Penicillins and Latex  Home Medications   Prior to Admission medications   Medication Sig Start Date End Date Taking? Authorizing Provider  acetaminophen (TYLENOL) 325 MG tablet Take 1,300 mg by mouth every 6 (six) hours as needed for mild pain.    Historical Provider, MD  azithromycin (ZITHROMAX Z-PAK) 250 MG tablet Take 2 pills today, then 1 pill daily until  gone. 01/05/15   Charm Rings, MD  cetirizine (ZYRTEC) 10 MG tablet Take 1 tablet (10 mg total) by mouth daily. 01/02/15   Charm Rings, MD  clindamycin (CLEOCIN) 300 MG capsule Take 1 capsule (300 mg total) by mouth 3 (three) times daily. 03/04/15   Hayden Rasmussen, NP  fluticasone (FLONASE) 50 MCG/ACT nasal spray Place 2 sprays into both nostrils daily. 01/02/15   Charm Rings, MD  HYDROCHLOROTHIAZIDE PO Take by mouth.    Historical Provider, MD  HYDROcodone-acetaminophen (NORCO/VICODIN) 5-325 MG tablet Take 1 tablet by mouth every 4 (four) hours as needed. 03/04/15   Hayden Rasmussen, NP  metFORMIN (GLUCOPHAGE) 500 MG tablet Take by mouth 2 (two) times daily with a meal.    Historical Provider, MD  methocarbamol (ROBAXIN) 500 MG tablet Take 1 tablet (500 mg total) by mouth 2 (two) times daily as needed for muscle spasms. 09/16/14   Everlene Farrier, PA-C  naproxen (NAPROSYN) 250 MG tablet Take 1 tablet (250 mg total) by mouth 2 (two) times daily with a meal. 09/16/14   Everlene Farrier, PA-C  ondansetron (ZOFRAN) 4 MG tablet Take 1 tablet (4 mg total) by mouth every 8 (eight) hours as needed for nausea or vomiting. 01/02/15   Charm Rings, MD   Meds Ordered and Administered this Visit  Medications - No data to display  BP  118/77 mmHg  Pulse 100  Temp(Src) 97.5 F (36.4 C) (Oral)  Resp 18  SpO2 98%  LMP 01/30/2015 (Exact Date) No data found.   Physical Exam  Constitutional: She appears well-developed and well-nourished. No distress.  HENT:  Mouth/Throat: Oropharynx is clear and moist. No oropharyngeal exudate.  Nearly all of the upper 3rd molar has eroded below the gum line. + for tenderness. No associated gingival swelling or hyperemic areas.  Neck: Normal range of motion. Neck supple.  Cardiovascular: Normal rate.   Pulmonary/Chest: Effort normal. No respiratory distress.  Neurological: She is alert.  Skin: Skin is warm and dry.  Nursing note and vitals reviewed.   ED Course  Procedures  (including critical care time)  Labs Review Labs Reviewed - No data to display  Imaging Review No results found.   Visual Acuity Review  Right Eye Distance:   Left Eye Distance:   Bilateral Distance:    Right Eye Near:   Left Eye Near:    Bilateral Near:         MDM   1. Pain, dental    Meds ordered this encounter  Medications  . clindamycin (CLEOCIN) 300 MG capsule    Sig: Take 1 capsule (300 mg total) by mouth 3 (three) times daily.    Dispense:  21 capsule    Refill:  0    Order Specific Question:  Supervising Provider    Answer:  Linna Hoff [phone]  . HYDROcodone-acetaminophen (NORCO/VICODIN) 5-325 MG tablet    Sig: Take 1 tablet by mouth every 4 (four) hours as needed.    Dispense:  15 tablet    Refill:  0    Order Specific Question:  Supervising Provider    Answer:  Linna Hoff [phone]   See dentist in 4 d.    Hayden Rasmussen, NP 03/04/15 1906

## 2015-03-04 NOTE — ED Notes (Signed)
The patient presented to the [facility] with a complaint of dental pain since yesterday.

## 2015-03-10 ENCOUNTER — Ambulatory Visit: Payer: Medicaid Other | Admitting: Obstetrics

## 2015-04-02 ENCOUNTER — Other Ambulatory Visit (HOSPITAL_COMMUNITY)
Admission: RE | Admit: 2015-04-02 | Discharge: 2015-04-02 | Disposition: A | Discharge status: Home / Self Care | Payer: Medicaid Other | Source: Ambulatory Visit | Attending: Emergency Medicine | Admitting: Emergency Medicine

## 2015-04-02 ENCOUNTER — Encounter (HOSPITAL_COMMUNITY): Payer: Self-pay | Admitting: *Deleted

## 2015-04-02 ENCOUNTER — Emergency Department (HOSPITAL_COMMUNITY)
Admission: EM | Admit: 2015-04-02 | Discharge: 2015-04-02 | Disposition: A | Discharge status: Home / Self Care | Payer: Medicaid Other | Source: Home / Self Care | Attending: Emergency Medicine | Admitting: Emergency Medicine

## 2015-04-02 DIAGNOSIS — Z202 Contact with and (suspected) exposure to infections with a predominantly sexual mode of transmission: Secondary | ICD-10-CM | POA: Diagnosis not present

## 2015-04-02 DIAGNOSIS — Z113 Encounter for screening for infections with a predominantly sexual mode of transmission: Secondary | ICD-10-CM | POA: Diagnosis present

## 2015-04-02 LAB — POCT PREGNANCY, URINE: Preg Test, Ur: NEGATIVE

## 2015-04-02 NOTE — ED Notes (Signed)
C/O vaginal discharge and itching x 1 wk - PCP called in Diflucan pill (pt took yesterday), but was told by partner that she should be checked for STDs.

## 2015-04-02 NOTE — Discharge Instructions (Signed)
Abstain from sexual activity until your cultures have been completed.

## 2015-04-02 NOTE — ED Provider Notes (Signed)
CSN: [id number]     Arrival date & time 04/02/15  1826 History   First MD Initiated Contact with Patient 04/02/15 1943     Chief Complaint  Patient presents with  . Vaginal Discharge   (Consider location/radiation/quality/duration/timing/severity/associated sxs/prior Treatment) HPI Pt states that her female partner called to say she may have been exposed to an STD.  No pain, itching, no home treatment. Sexually active but no change in sexual partner. ROS: +"ve vaginal discharge/ yeast infection that is being treated.  Denies: fever chills, previous STD   Past Medical History  Diagnosis Date  . Seizures (HCC)   . Kidney failure   . Diabetes mellitus without complication (HCC)   . Hypertension    Past Surgical History  Procedure Laterality Date  . Cholecystectomy     Family History  Problem Relation Age of Onset  . Kidney disease Mother   . Hypertension Mother   . Sickle cell anemia Father   . Hyperlipidemia Maternal Grandmother    Social History  Substance Use Topics  . Smoking status: Current Every Day Smoker -- 0.50 packs/day for 7 years    Types: Cigarettes  . Smokeless tobacco: Never Used  . Alcohol Use: No     Comment: socially   OB History    Gravida Para Term Preterm AB TAB SAB Ectopic Multiple Living   Review of Systems See HPI Allergies  Penicillins and Latex  Home Medications   Prior to Admission medications   Medication Sig Start Date End Date Taking? Authorizing Provider  HYDROCHLOROTHIAZIDE PO Take by mouth.   Yes Historical Provider, MD  metFORMIN (GLUCOPHAGE) 500 MG tablet Take by mouth 2 (two) times daily with a meal.   Yes Historical Provider, MD  acetaminophen (TYLENOL) 325 MG tablet Take 1,300 mg by mouth every 6 (six) hours as needed for mild pain.    Historical Provider, MD  azithromycin (ZITHROMAX Z-PAK) 250 MG tablet Take 2 pills today, then 1 pill daily until gone. 01/05/15   Charm Rings, MD  cetirizine (ZYRTEC) 10 MG  tablet Take 1 tablet (10 mg total) by mouth daily. 01/02/15   Charm Rings, MD  clindamycin (CLEOCIN) 300 MG capsule Take 1 capsule (300 mg total) by mouth 3 (three) times daily. 03/04/15   Hayden Rasmussen, NP  fluticasone (FLONASE) 50 MCG/ACT nasal spray Place 2 sprays into both nostrils daily. 01/02/15   Charm Rings, MD  HYDROcodone-acetaminophen (NORCO/VICODIN) 5-325 MG tablet Take 1 tablet by mouth every 4 (four) hours as needed. 03/04/15   Hayden Rasmussen, NP  methocarbamol (ROBAXIN) 500 MG tablet Take 1 tablet (500 mg total) by mouth 2 (two) times daily as needed for muscle spasms. 09/16/14   Everlene Farrier, PA-C  naproxen (NAPROSYN) 250 MG tablet Take 1 tablet (250 mg total) by mouth 2 (two) times daily with a meal. 09/16/14   Everlene Farrier, PA-C  ondansetron (ZOFRAN) 4 MG tablet Take 1 tablet (4 mg total) by mouth every 8 (eight) hours as needed for nausea or vomiting. 01/02/15   Charm Rings, MD   Meds Ordered and Administered this Visit  Medications - No data to display  BP 118/79 mmHg  Pulse 100  Temp(Src) 98.5 F (36.9 C) (Oral)  SpO2 100%  LMP 01/30/2015 (Approximate) No data found.   Physical Exam  Constitutional: She is oriented to person, place, and time. She appears well-developed.  HENT:  Head:  Normocephalic and atraumatic.  Pulmonary/Chest: Effort normal.  Abdominal: Soft.  Genitourinary:  Declined Pelvic exam no discharge.   Musculoskeletal: Normal range of motion.  Neurological: She is alert and oriented to person, place, and time.  Skin: Skin is warm and dry.  Psychiatric: She has a normal mood and affect. Her behavior is normal.    ED Course  Procedures (including critical care time)  Labs Review Labs Reviewed  POCT PREGNANCY, URINE  URINE CYTOLOGY ANCILLARY ONLY    Imaging Review No results found.   Visual Acuity Review  Right Eye Distance:   Left Eye Distance:   Bilateral Distance:    Right Eye Near:   Left Eye Near:    Bilateral Near:          MDM   1. Possible exposure to STD    Await culture result to commence treatment.     Tharon Aquas, PA 04/02/15 1949

## 2015-04-04 LAB — URINE CYTOLOGY ANCILLARY ONLY
CHLAMYDIA, DNA PROBE: NEGATIVE
Neisseria Gonorrhea: NEGATIVE

## 2015-07-04 ENCOUNTER — Ambulatory Visit (HOSPITAL_COMMUNITY)
Admission: EM | Admit: 2015-07-04 | Discharge: 2015-07-04 | Disposition: A | Discharge status: Home / Self Care | Payer: Medicaid Other | Attending: Family Medicine | Admitting: Family Medicine

## 2015-07-04 ENCOUNTER — Encounter (HOSPITAL_COMMUNITY): Payer: Self-pay | Admitting: Emergency Medicine

## 2015-07-04 DIAGNOSIS — F17210 Nicotine dependence, cigarettes, uncomplicated: Secondary | ICD-10-CM | POA: Diagnosis not present

## 2015-07-04 DIAGNOSIS — E119 Type 2 diabetes mellitus without complications: Secondary | ICD-10-CM | POA: Insufficient documentation

## 2015-07-04 DIAGNOSIS — Z79899 Other long term (current) drug therapy: Secondary | ICD-10-CM | POA: Diagnosis not present

## 2015-07-04 DIAGNOSIS — Z202 Contact with and (suspected) exposure to infections with a predominantly sexual mode of transmission: Secondary | ICD-10-CM | POA: Insufficient documentation

## 2015-07-04 DIAGNOSIS — Z7984 Long term (current) use of oral hypoglycemic drugs: Secondary | ICD-10-CM | POA: Insufficient documentation

## 2015-07-04 DIAGNOSIS — I10 Essential (primary) hypertension: Secondary | ICD-10-CM | POA: Insufficient documentation

## 2015-07-04 DIAGNOSIS — Z841 Family history of disorders of kidney and ureter: Secondary | ICD-10-CM | POA: Diagnosis not present

## 2015-07-04 DIAGNOSIS — Z880 Allergy status to penicillin: Secondary | ICD-10-CM | POA: Diagnosis not present

## 2015-07-04 DIAGNOSIS — Z91040 Latex allergy status: Secondary | ICD-10-CM | POA: Diagnosis not present

## 2015-07-04 DIAGNOSIS — Z8249 Family history of ischemic heart disease and other diseases of the circulatory system: Secondary | ICD-10-CM | POA: Diagnosis not present

## 2015-07-04 LAB — POCT URINALYSIS DIP (DEVICE)
Bilirubin Urine: NEGATIVE
Glucose, UA: NEGATIVE mg/dL
HGB URINE DIPSTICK: NEGATIVE
Ketones, ur: NEGATIVE mg/dL
Leukocytes, UA: NEGATIVE
NITRITE: NEGATIVE
PH: 7 (ref 5.0–8.0)
PROTEIN: NEGATIVE mg/dL
Specific Gravity, Urine: 1.02 (ref 1.005–1.030)
UROBILINOGEN UA: 0.2 mg/dL (ref 0.0–1.0)

## 2015-07-04 LAB — POCT PREGNANCY, URINE: Preg Test, Ur: NEGATIVE

## 2015-07-04 MED ORDER — AZITHROMYCIN 250 MG PO TABS
ORAL_TABLET | ORAL | Status: AC
  Filled 2015-07-04: qty 4

## 2015-07-04 MED ORDER — LIDOCAINE HCL (PF) 1 % IJ SOLN
INTRAMUSCULAR | Status: AC
  Filled 2015-07-04: qty 5

## 2015-07-04 MED ORDER — AZITHROMYCIN 250 MG PO TABS
1000.0000 mg | ORAL_TABLET | Freq: Once | ORAL | Status: AC
  Administered 2015-07-04: 1000 mg via ORAL

## 2015-07-04 MED ORDER — CEFTRIAXONE SODIUM 250 MG IJ SOLR
INTRAMUSCULAR | Status: AC
  Filled 2015-07-04: qty 250

## 2015-07-04 MED ORDER — CEFTRIAXONE SODIUM 250 MG IJ SOLR
250.0000 mg | Freq: Once | INTRAMUSCULAR | Status: AC
  Administered 2015-07-04: 250 mg via INTRAMUSCULAR

## 2015-07-04 NOTE — ED Notes (Signed)
Patient reports her partner was recently diagnosed with gonorrhea about 1 week ago. She is asymptomatic but would like to be checked. Patient is in NAD.

## 2015-07-04 NOTE — ED Provider Notes (Signed)
CSN: [id number]     Arrival date & time 07/04/15  1554 History   First MD Initiated Contact with Patient 07/04/15 1652     Chief Complaint  Patient presents with  . Exposure to STD   (Consider location/radiation/quality/duration/timing/severity/associated sxs/prior Treatment) HPI  Pt is advised partner disclosed confirmation of STD testing (GC) and has suggested patient be treated. Pt denies any symptoms at this time.  Denies pain, or other symptoms at this time ROS: +"ve  NO SYMPTOMS AT THIS TIME Denies: fever chills, previous STD       Past Medical History  Diagnosis Date  . Seizures (HCC)   . Kidney failure   . Diabetes mellitus without complication (HCC)   . Hypertension    Past Surgical History  Procedure Laterality Date  . Cholecystectomy     Family History  Problem Relation Age of Onset  . Kidney disease Mother   . Hypertension Mother   . Sickle cell anemia Father   . Hyperlipidemia Maternal Grandmother    Social History  Substance Use Topics  . Smoking status: Current Every Day Smoker -- 0.50 packs/day for 7 years    Types: Cigarettes  . Smokeless tobacco: Never Used  . Alcohol Use: No     Comment: socially   OB History    Gravida Para Term Preterm AB TAB SAB Ectopic Multiple Living   2 1 1  1  1   1      Review of Systems  Denies: HEADACHE, NAUSEA, ABDOMINAL PAIN, CHEST PAIN, CONGESTION, DYSURIA, SHORTNESS OF BREATH  Allergies  Penicillins and Latex  Home Medications   Prior to Admission medications   Medication Sig Start Date End Date Taking? Authorizing Provider  HYDROCHLOROTHIAZIDE PO Take by mouth.   Yes Historical Provider, MD  metFORMIN (GLUCOPHAGE) 500 MG tablet Take by mouth 2 (two) times daily with a meal.   Yes Historical Provider, MD  acetaminophen (TYLENOL) 325 MG tablet Take 1,300 mg by mouth every 6 (six) hours as needed for mild pain.    Historical Provider, MD  azithromycin (ZITHROMAX Z-PAK) 250 MG tablet Take 2 pills today, then 1  pill daily until gone. 01/05/15   Charm RingsErin J Honig, MD  cetirizine (ZYRTEC) 10 MG tablet Take 1 tablet (10 mg total) by mouth daily. 01/02/15   Charm RingsErin J Honig, MD  clindamycin (CLEOCIN) 300 MG capsule Take 1 capsule (300 mg total) by mouth 3 (three) times daily. 03/04/15   Hayden Rasmussenavid Mabe, NP  fluticasone (FLONASE) 50 MCG/ACT nasal spray Place 2 sprays into both nostrils daily. 01/02/15   Charm RingsErin J Honig, MD  HYDROcodone-acetaminophen (NORCO/VICODIN) 5-325 MG tablet Take 1 tablet by mouth every 4 (four) hours as needed. 03/04/15   Hayden Rasmussenavid Mabe, NP  methocarbamol (ROBAXIN) 500 MG tablet Take 1 tablet (500 mg total) by mouth 2 (two) times daily as needed for muscle spasms. 09/16/14   Everlene FarrierWilliam Dansie, PA-C  naproxen (NAPROSYN) 250 MG tablet Take 1 tablet (250 mg total) by mouth 2 (two) times daily with a meal. 09/16/14   Everlene FarrierWilliam Dansie, PA-C  ondansetron (ZOFRAN) 4 MG tablet Take 1 tablet (4 mg total) by mouth every 8 (eight) hours as needed for nausea or vomiting. 01/02/15   Charm RingsErin J Honig, MD   Meds Ordered and Administered this Visit   Medications  cefTRIAXone (ROCEPHIN) injection 250 mg (250 mg Intramuscular Given 07/04/15 1718)  azithromycin (ZITHROMAX) tablet 1,000 mg (1,000 mg Oral Given 07/04/15 1718)    Pulse 93  Temp(Src) 98.3 F (36.8  C) (Oral)  Resp 16  SpO2 97%  LMP 06/04/2015 No data found.   Physical Exam NURSES NOTES AND VITAL SIGNS REVIEWED. CONSTITUTIONAL: Well developed, well nourished, no acute distress HEENT: normocephalic, atraumatic EYES: Conjunctiva normal NECK:normal ROM, supple, no adenopathy PULMONARY:No respiratory distress, normal effort ABDOMINAL: Soft, ND, NT BS+, No CVAT MUSCULOSKELETAL: Normal ROM of all extremities,  SKIN: warm and dry without rash PSYCHIATRIC: Mood and affect, behavior are normal  ED Course  Procedures (including critical care time)  Labs Review Labs Reviewed  HIV ANTIBODY (ROUTINE TESTING)  POCT URINALYSIS DIP (DEVICE)  POCT PREGNANCY, URINE   URINE CYTOLOGY ANCILLARY ONLY   Pending Imaging Review No results found.   Visual Acuity Review  Right Eye Distance:   Left Eye Distance:   Bilateral Distance:    Right Eye Near:   Left Eye Near:    Bilateral Near:       Total Visit Time: 20 MINUTES "GREATER THAN 50% WAS SPENT IN COUNSELING AND COORDINATION OF CARE WITH THE PATIENT" DISCUSSION OF SAFE SEX, STD TREATMENT WAS PROVIDED.    MDM   1. STD exposure    . Patient is reassured that there are no issues that require transfer to higher level of care at this time or additional tests. Patient is advised to continue home symptomatic treatment. Patient is advised that if there are new or worsening symptoms to attend the emergency department, contact primary care provider, or return to [facility]. Instructions of care provided discharged home in stable condition.    THIS NOTE WAS GENERATED USING A VOICE RECOGNITION SOFTWARE PROGRAM. ALL REASONABLE EFFORTS  WERE MADE TO PROOFREAD THIS DOCUMENT FOR ACCURACY.  I have verbally reviewed the discharge instructions with the patient. A printed AVS was given to the patient.  All questions were answered prior to discharge.      Tharon Aquas, PA 07/04/15 1823  Tharon Aquas, [place] 07/04/15 [phone]

## 2015-07-04 NOTE — Discharge Instructions (Signed)
Safe Sex Safe sex is about reducing the risk of giving or getting a sexually transmitted disease (STD). STDs are spread through sexual contact involving the genitals, mouth, or rectum. Some STDs can be cured and others cannot. Safe sex can also prevent unintended pregnancies.  WHAT ARE SOME SAFE SEX PRACTICES?  Limit your sexual activity to only one partner who is having sex with only you.  Talk to your partner about his or her past partners, past STDs, and drug use.  Use a condom every time you have sexual intercourse. This includes vaginal, oral, and anal sexual activity. Both females and males should wear condoms during oral sex. Only use latex or polyurethane condoms and water-based lubricants. Using petroleum-based lubricants or oils to lubricate a condom will weaken the condom and increase the chance that it will break. The condom should be in place from the beginning to the end of sexual activity. Wearing a condom reduces, but does not completely eliminate, your risk of getting or giving an STD. STDs can be spread by contact with infected body fluids and skin.  Get vaccinated for hepatitis B and HPV.  Avoid alcohol and recreational drugs, which can affect your judgment. You may forget to use a condom or participate in high-risk sex.  For females, avoid douching after sexual intercourse. Douching can spread an infection farther into the reproductive tract.  Check your body for signs of sores, blisters, rashes, or unusual discharge. See your health care provider if you notice any of these signs.  Avoid sexual contact if you have symptoms of an infection or are being treated for an STD. If you or your partner has herpes, avoid sexual contact when blisters are present. Use condoms at all other times.  If you are at risk of being infected with HIV, it is recommended that you take a prescription medicine daily to prevent HIV infection. This is called pre-exposure prophylaxis (PrEP). You are  considered at risk if:  You are a man who has sex with other men (MSM).  You are a heterosexual man or woman who is sexually active with more than one partner.  You take drugs by injection.  You are sexually active with a partner who has HIV.  Talk with your health care provider about whether you are at high risk of being infected with HIV. If you choose to begin PrEP, you should first be tested for HIV. You should then be tested every 3 months for as long as you are taking PrEP.  See your health care provider for regular screenings, exams, and tests for other STDs. Before having sex with a new partner, each of you should be screened for STDs and should talk about the results with each other. WHAT ARE THE BENEFITS OF SAFE SEX?   There is less chance of getting or giving an STD.  You can prevent unwanted or unintended pregnancies.  By discussing safe sex concerns with your partner, you may increase feelings of intimacy, comfort, trust, and honesty between the two of you.   This information is not intended to replace advice given to you by your health care provider. Make sure you discuss any questions you have with your health care provider.   Document Released: 03/01/2004 Document Revised: 02/12/2014 Document Reviewed: 07/16/2011 Elsevier Interactive Patient Education [employer] Gonorrhea Gonorrhea is an infection that can cause serious problems. If left untreated, the infection may:   Damage the female or female organs.   Cause women to be unable  to have children (sterility).   Harm a fetus if the infected woman is pregnant.  It is important to get treatment for gonorrhea as soon as possible. It is also necessary that all your sexual partners be tested for the infection.  CAUSES  Gonorrhea is caused by bacteria called Neisseria gonorrhoeae. The infection is spread from person to person, usually by sexual contact (such as by anal, vaginal, or oral means). A newborn can  contract the infection from his or her mother during birth.  RISK FACTORS  Being a woman younger than 28 years of age who is sexually active.  Being a woman 63 years of age or older who has:  A new sex partner.  More than one sex partner.  A sex partner who has a sexually transmitted disease (STD).  Using condoms inconsistently.  Currently having, or having previously had, an STD.  Exchanging sex or money or drugs. SYMPTOMS  Some people with gonorrhea do not have symptoms. Symptoms may be different in females and males.  Females The most common symptoms are:   Pain in the lower abdomen.   Fever with or without chills.  Other symptoms include:   Abnormal vaginal discharge.   Painful intercourse.   Burning or itching of the vagina or lips of the vagina.   Abnormal vaginal bleeding.   Pain when urinating.   Long-lasting (chronic) pain in the lower abdomen, especially during menstruation or intercourse.   Inability to become pregnant.   Going into premature labor.   Irritation, pain, bleeding, or discharge from the rectum. This may occur if the infection was spread by anal sex.   Sore throat or swollen lymph nodes in the neck. This may occur if the infection was spread by oral sex.  Males The most common symptoms are:   Discharge from the penis.   Pain or burning during urination.   Pain or swelling in the testicles. Other symptoms may include:   Irritation, pain, bleeding, or discharge from the rectum. This may occur if the infection was spread by anal sex.   Sore throat, fever, or swollen lymph nodes in the neck. This may occur if the infection was spread by oral sex.  DIAGNOSIS  A diagnosis is made after a physical exam is done and a sample of discharge is examined under a microscope for the presence of the bacteria. The discharge may be taken from the urethra, cervix, throat, or rectum.  TREATMENT  Gonorrhea is treated with antibiotic  medicines. It is important for treatment to begin as soon as possible. Early treatment may prevent some problems from developing. Do not have sex. Avoid all types of sexual activity for 7 days after treatment is complete and until any sex partners have been treated. HOME CARE INSTRUCTIONS   Take medicines only as directed by your health care provider.   Take your antibiotic medicine as directed by your health care provider. Finish the antibiotic even if you start to feel better. Incomplete treatment will put you at risk for continued infection.   Do not have sex until treatment is complete or as directed by your health care provider.   Keep all follow-up visits as directed by your health care provider.   Not all test results are available during your visit. If your test results are not back during the visit, make an appointment with your health care provider to find out the results. Do not assume everything is normal if you have not heard from your health  care provider or the medical facility. It is your responsibility to get your test results.  If you test positive for gonorrhea, inform your recent sexual partners. They need to be checked for gonorrhea even if they do not have symptoms. They may need treatment, even if they test negative for gonorrhea.  SEEK MEDICAL CARE IF:   You develop any bad reaction to the medicine you were prescribed. This may include:   A rash.   Nausea.   Vomiting.   Diarrhea.   Your symptoms do not improve after a few days of taking antibiotics.   Your symptoms get worse.   You develop increased pain, such as in the testicles (for males) or in the abdomen (for females).  You have a fever. MAKE SURE YOU:   Understand these instructions.  Will watch your condition.  Will get help right away if you are not doing well or get worse.   This information is not intended to replace advice given to you by your health care provider. Make sure you  discuss any questions you have with your health care provider.   Document Released: 01/20/2000 Document Revised: 02/12/2014 Document Reviewed: 07/30/2012 Elsevier Interactive Patient Education [employer] Inc[employer]

## 2015-07-05 LAB — URINE CYTOLOGY ANCILLARY ONLY
CHLAMYDIA, DNA PROBE: NEGATIVE
Neisseria Gonorrhea: NEGATIVE

## 2015-07-05 LAB — HIV ANTIBODY (ROUTINE TESTING W REFLEX): HIV SCREEN 4TH GENERATION: NONREACTIVE

## 2015-07-07 ENCOUNTER — Telehealth (HOSPITAL_COMMUNITY): Payer: Self-pay | Admitting: Emergency Medicine

## 2015-07-07 NOTE — ED Notes (Signed)
Pt called wanting results.... Results given from visit on 5/29 STD reports neg on Gon/Chlam, HIV Gave pt info on safe sex... She verb understanding.

## 2016-03-20 ENCOUNTER — Encounter (HOSPITAL_COMMUNITY): Payer: Self-pay | Admitting: Emergency Medicine

## 2016-03-20 ENCOUNTER — Ambulatory Visit (HOSPITAL_COMMUNITY)
Admission: EM | Admit: 2016-03-20 | Discharge: 2016-03-20 | Disposition: A | Discharge status: Home / Self Care | Payer: Medicaid Other | Attending: Family Medicine | Admitting: Family Medicine

## 2016-03-20 DIAGNOSIS — J069 Acute upper respiratory infection, unspecified: Secondary | ICD-10-CM

## 2016-03-20 DIAGNOSIS — B349 Viral infection, unspecified: Secondary | ICD-10-CM

## 2016-03-20 NOTE — ED Triage Notes (Signed)
The patient presented to the [facility] with a complaint of a cough and body aches x 2 days. 

## 2016-03-20 NOTE — Discharge Instructions (Signed)
Sudafed PE 10 mg every 4 to 6 hours as needed for congestion °Allegra or Zyrtec daily as needed for drainage and runny nose. °For stronger antihistamine may take Chlor-Trimeton 2 to 4 mg every 4 to 6 hours, may cause drowsiness. °Saline nasal spray used frequently. °Ibuprofen 600 mg every 6 hours as needed for pain, discomfort or fever. °Drink plenty of fluids and stay well-hydrated. °

## 2016-03-20 NOTE — ED Provider Notes (Signed)
CSN: [id number]     Arrival date & time 03/20/16  1036 History   First MD Initiated Contact with Patient 03/20/16 1209     Chief Complaint  Patient presents with  . Cough   (Consider location/radiation/quality/duration/timing/severity/associated sxs/prior Treatment) 31 year old female complaining of fevers off and on since yesterday. She states her temperature home was 99.5. 7 some body aches and headache. She had one episode of vomiting yesterday. She did have a flu shot this year. He states she has had the flu some seasons ago and states that she was very sick at that time and she does not feel like that she has the full blown flu this time.      Past Medical History:  Diagnosis Date  . Diabetes mellitus without complication (HCC)   . Hypertension   . Kidney failure   . Seizures (HCC)    Past Surgical History:  Procedure Laterality Date  . CHOLECYSTECTOMY     Family History  Problem Relation Age of Onset  . Kidney disease Mother   . Hypertension Mother   . Sickle cell anemia Father   . Hyperlipidemia Maternal Grandmother    Social History  Substance Use Topics  . Smoking status: Current Every Day Smoker    Packs/day: 0.50    Years: 7.00    Types: Cigarettes  . Smokeless tobacco: Never Used  . Alcohol use No     Comment: socially   OB History    Gravida Para Term Preterm AB Living   2 1 1   1 1    SAB TAB Ectopic Multiple Live Births   1       1     Review of Systems  Constitutional: Positive for fatigue and fever. Negative for activity change, appetite change and chills.  HENT: Positive for congestion, postnasal drip and rhinorrhea. Negative for facial swelling.   Eyes: Negative.   Respiratory: Positive for cough. Negative for shortness of breath.   Cardiovascular: Negative.   Musculoskeletal: Positive for myalgias. Negative for neck pain and neck stiffness.  Skin: Negative for pallor and rash.  Neurological: Negative.     Allergies  Penicillins and  Latex  Home Medications   Prior to Admission medications   Medication Sig Start Date End Date Taking? Authorizing Provider  glimepiride (AMARYL) 4 MG tablet Take 4 mg by mouth daily with breakfast.   Yes Historical Provider, MD  HYDROCHLOROTHIAZIDE PO Take by mouth.   Yes Historical Provider, MD  metFORMIN (GLUCOPHAGE) 500 MG tablet Take by mouth 2 (two) times daily with a meal.   Yes Historical Provider, MD   Meds Ordered and Administered this Visit  Medications - No data to display  BP 137/80 (BP Location: Right Arm)   Pulse 102   Temp 98.2 F (36.8 C) (Oral)   Resp 16   SpO2 97%  No data found.   Physical Exam  Constitutional: She is oriented to person, place, and time. She appears well-developed and well-nourished. No distress.  HENT:  Right Ear: External ear normal.  Left Ear: External ear normal.  Mouth/Throat: No oropharyngeal exudate.  Lateral TMs are normal. Oropharynx with minor erythema and scant clear PND otherwise clear.  Eyes: EOM are normal.  Neck: Normal range of motion. Neck supple.  Cardiovascular: Normal rate, regular rhythm, normal heart sounds and intact distal pulses.   Pulmonary/Chest: Effort normal and breath sounds normal. No respiratory distress. She has no wheezes. She has no rales.  Musculoskeletal: Normal range of motion. She  exhibits no edema.  Lymphadenopathy:    She has no cervical adenopathy.  Neurological: She is alert and oriented to person, place, and time.  Skin: Skin is warm and dry.  Psychiatric: She has a normal mood and affect.  Nursing note and vitals reviewed.   Urgent Care Course     Procedures (including critical care time)  Labs Review Labs Reviewed - No data to display  Imaging Review No results found.   Visual Acuity Review  Right Eye Distance:   Left Eye Distance:   Bilateral Distance:    Right Eye Near:   Left Eye Near:    Bilateral Near:         MDM   1. Viral illness   2. Acute upper respiratory  infection    Sudafed PE 10 mg every 4 to 6 hours as needed for congestion Allegra or Zyrtec daily as needed for drainage and runny nose. For stronger antihistamine may take Chlor-Trimeton 2 to 4 mg every 4 to 6 hours, may cause drowsiness. Saline nasal spray used frequently. Ibuprofen 600 mg every 6 hours as needed for pain, discomfort or fever. Drink plenty of fluids and stay well-hydrated.     Hayden Rasmussen, NP 03/20/16 [phone]

## 2016-06-24 ENCOUNTER — Ambulatory Visit (HOSPITAL_COMMUNITY)
Admission: EM | Admit: 2016-06-24 | Discharge: 2016-06-24 | Disposition: A | Discharge status: Home / Self Care | Payer: Medicaid Other | Attending: Internal Medicine | Admitting: Internal Medicine

## 2016-06-24 ENCOUNTER — Encounter (HOSPITAL_COMMUNITY): Payer: Self-pay | Admitting: *Deleted

## 2016-06-24 DIAGNOSIS — B9789 Other viral agents as the cause of diseases classified elsewhere: Secondary | ICD-10-CM

## 2016-06-24 DIAGNOSIS — J069 Acute upper respiratory infection, unspecified: Secondary | ICD-10-CM | POA: Diagnosis not present

## 2016-06-24 HISTORY — DX: Disorder of kidney and ureter, unspecified: N28.9

## 2016-06-24 MED ORDER — CETIRIZINE-PSEUDOEPHEDRINE ER 5-120 MG PO TB12: 1.0000 | ORAL_TABLET | Freq: Every day | ORAL | 0 refills | Status: DC

## 2016-06-24 MED ORDER — BENZONATATE 100 MG PO CAPS: 100.0000 mg | ORAL_CAPSULE | Freq: Three times a day (TID) | ORAL | 0 refills | Status: DC

## 2016-06-24 NOTE — ED Provider Notes (Signed)
CSN: [id number]     Arrival date & time 06/24/16  1922 History   First MD Initiated Contact with Patient 06/24/16 2139     Chief Complaint  Patient presents with  . Nasal Congestion  . Cough   (Consider location/radiation/quality/duration/timing/severity/associated sxs/prior Treatment) The history is provided by the patient.  Cough  Cough characteristics:  Non-productive, dry and hacking Sputum characteristics:  Clear Severity:  Moderate Onset quality:  Gradual Timing:  Constant Progression:  Worsening Chronicity:  New Smoker: no   Context: not fumes and not occupational exposure   Relieved by:  Nothing Worsened by:  Nothing Ineffective treatments:  None tried Associated symptoms: chills, headaches, rhinorrhea, sinus congestion and sore throat   Associated symptoms: no chest pain, no ear fullness, no ear pain, no eye discharge, no fever and no shortness of breath     Past Medical History:  Diagnosis Date  . Diabetes mellitus without complication (HCC)   . Hypertension   . Renal disorder   . Seizures (HCC)    Past Surgical History:  Procedure Laterality Date  . CHOLECYSTECTOMY     Family History  Problem Relation Age of Onset  . Kidney disease Mother   . Hypertension Mother   . Sickle cell anemia Father   . Hyperlipidemia Maternal Grandmother    Social History  Substance Use Topics  . Smoking status: Current Every Day Smoker    Packs/day: 0.50    Years: 7.00    Types: Cigarettes  . Smokeless tobacco: Never Used  . Alcohol use No     Comment: socially   OB History    Gravida Para Term Preterm AB Living   2 1 1   1 1    SAB TAB Ectopic Multiple Live Births   1       1     Review of Systems  Constitutional: Positive for chills. Negative for fever.  HENT: Positive for rhinorrhea and sore throat. Negative for congestion, ear pain, sinus pain and sinus pressure.   Eyes: Negative for discharge and redness.  Respiratory: Positive for cough. Negative for  shortness of breath.   Cardiovascular: Negative for chest pain and palpitations.  Gastrointestinal: Negative for abdominal pain, diarrhea, nausea and vomiting.  Musculoskeletal: Negative.   Neurological: Positive for headaches. Negative for dizziness.    Allergies  Penicillins and Latex  Home Medications   Prior to Admission medications   Medication Sig Start Date End Date Taking? Authorizing Provider  glimepiride (AMARYL) 4 MG tablet Take 4 mg by mouth daily with breakfast.   Yes [provider]  HYDROCHLOROTHIAZIDE PO Take by mouth.   Yes [provider]  metFORMIN (GLUCOPHAGE) 500 MG tablet Take by mouth 2 (two) times daily with a meal.   Yes [provider]  benzonatate (TESSALON) 100 MG capsule Take 1 capsule (100 mg total) by mouth every 8 (eight) hours. 06/24/16   Dorena BodoKennard, Issiac Jamar, NP  cetirizine-pseudoephedrine (ZYRTEC-D) 5-120 MG tablet Take 1 tablet by mouth daily. 06/24/16   Dorena BodoKennard, Jakara Blatter, NP   Meds Ordered and Administered this Visit  Medications - No data to display  BP (!) 132/93   Pulse 91   Temp 98.6 F (37 C) (Oral)   Resp 18   LMP 06/19/2016 (Approximate)   SpO2 100%  No data found.   Physical Exam  Constitutional: She is oriented to person, place, and time. She appears well-developed and well-nourished. She does not have a sickly appearance. She does not appear ill. No distress.  HENT:  Head: Normocephalic and atraumatic.  Right Ear: Tympanic membrane and external ear normal.  Left Ear: Tympanic membrane and external ear normal.  Nose: Nose normal. Right sinus exhibits no maxillary sinus tenderness and no frontal sinus tenderness. Left sinus exhibits no maxillary sinus tenderness and no frontal sinus tenderness.  Mouth/Throat: Uvula is midline and oropharynx is clear and moist. No oropharyngeal exudate.  Eyes: Conjunctivae are normal.  Neck: Normal range of motion. Neck supple. No JVD present.  Cardiovascular: Normal rate and  regular rhythm.   Pulmonary/Chest: Effort normal and breath sounds normal. No respiratory distress. She has no wheezes.  Abdominal: Soft. Bowel sounds are normal. She exhibits no distension. There is no tenderness. There is no guarding.  Lymphadenopathy:    She has no cervical adenopathy.  Neurological: She is alert and oriented to person, place, and time.  Skin: Skin is warm and dry. Capillary refill takes less than 2 seconds. She is not diaphoretic.  Psychiatric: She has a normal mood and affect. Her behavior is normal.  Nursing note and vitals reviewed.   Urgent Care Course     Procedures (including critical care time)  Labs Review Labs Reviewed - No data to display  Imaging Review No results found.     MDM   1. Viral URI with cough    Given Tessalon for cough, provided counseling on over-the-counter therapies for symptom management. Follow-up with primary care, return to clinic in one week as needed    Dorena Bodo, NP 06/24/16 2207

## 2016-06-24 NOTE — Discharge Instructions (Signed)
You most likely have a viral URI, I advise rest, plenty of fluids and management of symptoms with over the counter medicines. For symptoms you may take Tylenol as needed every 4-6 hours for body aches or fever, not to exceed 4,000 mg a day, Take mucinex or mucinex DM ever 12 hours with a full glass of water, you may use an inhaled steroid such as Flonase, 2 sprays each nostril once a day for congestion, or an antihistamine such as Claritin or Zyrtec once a day. For cough, I have prescribed a medication called Tessalon. Take 1 tablet every 8 hours as needed for your cough. Should your symptoms worsen or fail to resolve, follow up with your primary care provider or return to clinic.  °

## 2016-06-24 NOTE — ED Triage Notes (Signed)
C/O sinus pressure and congestion, chest congestion with cough x 2 days.  Denies fevers.

## 2016-12-11 ENCOUNTER — Ambulatory Visit: Payer: Medicaid Other | Admitting: Obstetrics

## 2016-12-20 ENCOUNTER — Ambulatory Visit: Payer: Medicaid Other | Admitting: Obstetrics

## 2017-01-24 ENCOUNTER — Encounter (HOSPITAL_COMMUNITY): Payer: Self-pay | Admitting: Emergency Medicine

## 2017-01-24 ENCOUNTER — Emergency Department (HOSPITAL_COMMUNITY): Payer: Medicaid Other

## 2017-01-24 ENCOUNTER — Emergency Department (HOSPITAL_COMMUNITY)
Admission: EM | Admit: 2017-01-24 | Discharge: 2017-01-24 | Disposition: A | Discharge status: Home / Self Care | Payer: Medicaid Other | Attending: Emergency Medicine | Admitting: Emergency Medicine

## 2017-01-24 DIAGNOSIS — Y92410 Unspecified street and highway as the place of occurrence of the external cause: Secondary | ICD-10-CM | POA: Diagnosis not present

## 2017-01-24 DIAGNOSIS — E119 Type 2 diabetes mellitus without complications: Secondary | ICD-10-CM | POA: Diagnosis not present

## 2017-01-24 DIAGNOSIS — M545 Low back pain, unspecified: Secondary | ICD-10-CM

## 2017-01-24 DIAGNOSIS — Z79899 Other long term (current) drug therapy: Secondary | ICD-10-CM | POA: Diagnosis not present

## 2017-01-24 DIAGNOSIS — I10 Essential (primary) hypertension: Secondary | ICD-10-CM | POA: Diagnosis not present

## 2017-01-24 DIAGNOSIS — Y998 Other external cause status: Secondary | ICD-10-CM | POA: Insufficient documentation

## 2017-01-24 DIAGNOSIS — Y939 Activity, unspecified: Secondary | ICD-10-CM | POA: Insufficient documentation

## 2017-01-24 DIAGNOSIS — T148XXA Other injury of unspecified body region, initial encounter: Secondary | ICD-10-CM

## 2017-01-24 DIAGNOSIS — F17210 Nicotine dependence, cigarettes, uncomplicated: Secondary | ICD-10-CM | POA: Insufficient documentation

## 2017-01-24 DIAGNOSIS — Z91040 Latex allergy status: Secondary | ICD-10-CM | POA: Diagnosis not present

## 2017-01-24 LAB — I-STAT BETA HCG BLOOD, ED (MC, WL, AP ONLY)

## 2017-01-24 MED ORDER — ACETAMINOPHEN 500 MG PO TABS
1000.0000 mg | ORAL_TABLET | Freq: Once | ORAL | Status: AC
  Administered 2017-01-24: 1000 mg via ORAL
  Filled 2017-01-24: qty 2

## 2017-01-24 MED ORDER — NAPROXEN 500 MG PO TABS: 500.0000 mg | ORAL_TABLET | Freq: Two times a day (BID) | ORAL | 0 refills | Status: DC

## 2017-01-24 MED ORDER — METHOCARBAMOL 500 MG PO TABS: 500.0000 mg | ORAL_TABLET | Freq: Two times a day (BID) | ORAL | 0 refills | Status: DC
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
## 2017-02-17 ENCOUNTER — Ambulatory Visit (HOSPITAL_COMMUNITY)
Admission: EM | Admit: 2017-02-17 | Discharge: 2017-02-17 | Disposition: A | Discharge status: Home / Self Care | Payer: Medicaid Other | Attending: Family Medicine | Admitting: Family Medicine

## 2017-02-17 ENCOUNTER — Other Ambulatory Visit: Payer: Self-pay

## 2017-02-17 ENCOUNTER — Encounter (HOSPITAL_COMMUNITY): Payer: Self-pay | Admitting: *Deleted

## 2017-02-17 DIAGNOSIS — B349 Viral infection, unspecified: Secondary | ICD-10-CM

## 2017-02-17 HISTORY — DX: Tubulo-interstitial nephritis, not specified as acute or chronic: N12

## 2017-02-17 MED ORDER — FLUTICASONE PROPIONATE 50 MCG/ACT NA SUSP: 2.0000 | Freq: Every day | NASAL | 0 refills | Status: DC

## 2017-02-17 MED ORDER — ONDANSETRON 8 MG PO TBDP: 8.0000 mg | ORAL_TABLET | Freq: Three times a day (TID) | ORAL | 0 refills | Status: DC | PRN

## 2017-02-17 NOTE — Discharge Instructions (Signed)
Ginger tea:  dice up some ginger root and make a tea out of it.

## 2017-02-17 NOTE — ED Provider Notes (Signed)
[facility]   [id number] 02/17/17 Arrival Time: 1520   SUBJECTIVE:  Joanne Waller is a 32 y.o. female who presents to the urgent care with complaint ofallergy-like sxs (sneezing, cough, runny nose) x 6 days with progressive worsening.  Today started vomiting.  Unsure if fevers.  Has not been taking any meds.   Past Medical History:  Diagnosis Date  . Diabetes mellitus without complication (HCC)   . Hypertension   . Pyelonephritis   . Renal disorder    pyelonephritis  . Seizures (HCC)    Family History  Problem Relation Age of Onset  . Kidney disease Mother   . Hypertension Mother   . Sickle cell anemia Father   . Hyperlipidemia Maternal Grandmother    Social History   Socioeconomic History  . Marital status: Single    Spouse name: Not on file  . Number of children: Not on file  . Years of education: Not on file  . Highest education level: Not on file  Social Needs  . Financial resource strain: Not on file  . Food insecurity - worry: Not on file  . Food insecurity - inability: Not on file  . Transportation needs - medical: Not on file  . Transportation needs - non-medical: Not on file  Occupational History  . Not on file  Tobacco Use  . Smoking status: Current Every Day Smoker    Packs/day: 0.50    Years: 7.00    Pack years: 3.50    Types: Cigarettes  . Smokeless tobacco: Never Used  Substance and Sexual Activity  . Alcohol use: No  . Drug use: No  . Sexual activity: Yes    Birth control/protection: None  Other Topics Concern  . Not on file  Social History Narrative  . Not on file   Current Meds  Medication Sig  . Liraglutide (VICTOZA [place]) Inject into the skin.  Marland Kitchen. LOSARTAN POTASSIUM PO Take by mouth.  Marland Kitchen. LOSARTAN POTASSIUM-HCTZ PO Take by mouth.  . metFORMIN (GLUCOPHAGE) 500 MG tablet Take by mouth 2 (two) times daily with a meal.   Allergies  Allergen Reactions  . Penicillins Other (See Comments)    Sharp pain in temple   . Latex  Itching, Swelling and Rash      ROS: As per HPI, remainder of ROS negative.   OBJECTIVE:   Vitals:   02/17/17 1543  BP: 106/61  Pulse: (!) 123  Resp: 16  Temp: 98.8 F (37.1 C)  TempSrc: Oral  SpO2: 96%     General appearance: alert; no distress Eyes: PERRL; EOMI; conjunctiva normal HENT: normocephalic; atraumatic; TMs normal, canal normal, external ears normal without trauma; nasal mucosa normal; oral mucosa normal Neck: supple Lungs: clear to auscultation bilaterally Heart: regular rate and rhythm Abdomen: soft, non-tender; bowel sounds normal; no masses or organomegaly; no guarding or rebound tenderness Back: no CVA tenderness Extremities: no cyanosis or edema; symmetrical with no gross deformities Skin: warm and dry Neurologic: normal gait; grossly normal Psychological: alert and cooperative; normal mood and affect      Labs:  Results for orders placed or performed during the hospital encounter of 01/24/17  I-Stat Beta hCG blood, ED ([facility], [facility], [facility] only)  Result Value Ref Range   I-stat hCG, quantitative <5.0 <5 mIU/mL   Comment 3            Labs Reviewed - No data to display  No results found.     ASSESSMENT & PLAN:  1. Viral  illness     Meds ordered this encounter  Medications  . ondansetron (ZOFRAN-ODT) 8 MG disintegrating tablet    Sig: Take 1 tablet (8 mg total) by mouth every 8 (eight) hours as needed for nausea.    Dispense:  12 tablet    Refill:  0    Reviewed expectations re: course of current medical issues. Questions answered. Outlined signs and symptoms indicating need for more acute intervention. Patient verbalized understanding. After Visit Summary given.    Procedures:      Elvina Sidle, MD 02/17/17 [phone]

## 2017-02-17 NOTE — ED Triage Notes (Signed)
C/O allergy-like sxs (sneezing, cough, runny nose) x 6 days with progressive worsening.  Today started vomiting.  Unsure if fevers.  Has not been taking any meds.

## 2017-07-03 ENCOUNTER — Ambulatory Visit (HOSPITAL_COMMUNITY)
Admission: EM | Admit: 2017-07-03 | Discharge: 2017-07-03 | Disposition: A | Discharge status: Home / Self Care | Payer: Medicaid Other | Attending: Family Medicine | Admitting: Family Medicine

## 2017-07-03 ENCOUNTER — Encounter (HOSPITAL_COMMUNITY): Payer: Self-pay | Admitting: Emergency Medicine

## 2017-07-03 DIAGNOSIS — Z880 Allergy status to penicillin: Secondary | ICD-10-CM | POA: Diagnosis not present

## 2017-07-03 DIAGNOSIS — Z202 Contact with and (suspected) exposure to infections with a predominantly sexual mode of transmission: Secondary | ICD-10-CM | POA: Insufficient documentation

## 2017-07-03 DIAGNOSIS — I10 Essential (primary) hypertension: Secondary | ICD-10-CM | POA: Insufficient documentation

## 2017-07-03 DIAGNOSIS — Z3202 Encounter for pregnancy test, result negative: Secondary | ICD-10-CM

## 2017-07-03 DIAGNOSIS — Z8249 Family history of ischemic heart disease and other diseases of the circulatory system: Secondary | ICD-10-CM | POA: Insufficient documentation

## 2017-07-03 DIAGNOSIS — F17210 Nicotine dependence, cigarettes, uncomplicated: Secondary | ICD-10-CM | POA: Diagnosis not present

## 2017-07-03 DIAGNOSIS — E119 Type 2 diabetes mellitus without complications: Secondary | ICD-10-CM | POA: Insufficient documentation

## 2017-07-03 DIAGNOSIS — Z113 Encounter for screening for infections with a predominantly sexual mode of transmission: Secondary | ICD-10-CM | POA: Diagnosis not present

## 2017-07-03 DIAGNOSIS — Z91040 Latex allergy status: Secondary | ICD-10-CM | POA: Diagnosis not present

## 2017-07-03 LAB — POCT URINALYSIS DIP (DEVICE)
BILIRUBIN URINE: NEGATIVE
GLUCOSE, UA: NEGATIVE mg/dL
KETONES UR: NEGATIVE mg/dL
LEUKOCYTES UA: NEGATIVE
Nitrite: NEGATIVE
PH: 6 (ref 5.0–8.0)
Protein, ur: 30 mg/dL — AB
Specific Gravity, Urine: >=1.03 (ref 1.005–1.030)
Urobilinogen, UA: 0.2 mg/dL (ref 0.0–1.0)

## 2017-07-03 LAB — POCT PREGNANCY, URINE: PREG TEST UR: NEGATIVE

## 2017-07-03 MED ORDER — AZITHROMYCIN 250 MG PO TABS
ORAL_TABLET | ORAL | Status: AC
  Filled 2017-07-03: qty 4

## 2017-07-03 MED ORDER — CEFTRIAXONE SODIUM 250 MG IJ SOLR
INTRAMUSCULAR | Status: AC
  Filled 2017-07-03: qty 250

## 2017-07-03 MED ORDER — CEFTRIAXONE SODIUM 250 MG IJ SOLR
250.0000 mg | Freq: Once | INTRAMUSCULAR | Status: AC
  Administered 2017-07-03: 250 mg via INTRAMUSCULAR

## 2017-07-03 MED ORDER — AZITHROMYCIN 250 MG PO TABS
1000.0000 mg | ORAL_TABLET | Freq: Once | ORAL | Status: AC
  Administered 2017-07-03: 1000 mg via ORAL

## 2017-07-03 MED ORDER — LIDOCAINE HCL 2 % IJ SOLN
INTRAMUSCULAR | Status: AC
  Filled 2017-07-03: qty 20

## 2017-07-03 NOTE — ED Provider Notes (Signed)
[facility]   [id number] 07/03/17 Arrival Time: 1011  ASSESSMENT & PLAN:  1. Exposure to STD      Discharge Instructions     You have been given the following medications today for treatment of suspected gonorrhea and/or chlamydia:  cefTRIAXone (ROCEPHIN) injection 250 mg azithromycin (ZITHROMAX) tablet 1,000 mg  Even though we have treated you today, we have sent testing for sexually transmitted infections. We will notify you of any positive results once they are received. If required, we will prescribe any medications you might need.  Please refrain from all sexual activity for at least the next seven days.    Pending: Labs Reviewed  POCT URINALYSIS DIP (DEVICE) - Abnormal; Notable for the following components:      Result Value   Hgb urine dipstick SMALL (*)    Protein, ur 30 (*)    All other components within normal limits  POCT PREGNANCY, URINE  URINE CYTOLOGY ANCILLARY ONLY   Reviewed expectations re: course of current medical issues. Questions answered. Outlined signs and symptoms indicating need for more acute intervention. Patient verbalized understanding. After Visit Summary given.   SUBJECTIVE:  Joanne Waller is a 32 y.o. female who reports possible exposure to STD. Partner recently tested + for NGU. She reports no vaginal discharge. Uurinary symptoms: none. Afebrile. No abdominal or pelvic pain. No n/v. No rashes or lesions. Sexually active with single female partner. History of similar symptoms: none reported.  No LMP recorded. (Menstrual status: Irregular Periods).  ROS: As per HPI.  OBJECTIVE:  Vitals:   07/03/17 1059  BP: 124/86  Pulse: 90  Resp: 18  Temp: 97.8 F (36.6 C)  TempSrc: Oral  SpO2: 98%     General appearance: alert, cooperative, appears stated age and no distress Throat: lips, mucosa, and tongue normal; teeth and gums normal Back: no CVA tenderness Abdomen: soft, non-tender GU: declines Skin: warm and  dry Psychological:  Alert and cooperative. Normal mood and affect.  Results for orders placed or performed during the hospital encounter of 07/03/17  POCT urinalysis dip (device)  Result Value Ref Range   Glucose, UA NEGATIVE NEGATIVE mg/dL   Bilirubin Urine NEGATIVE NEGATIVE   Ketones, ur NEGATIVE NEGATIVE mg/dL   Specific Gravity, Urine >=1.030 1.005 - 1.030   Hgb urine dipstick SMALL (A) NEGATIVE   pH 6.0 5.0 - 8.0   Protein, ur 30 (A) NEGATIVE mg/dL   Urobilinogen, UA 0.2 0.0 - 1.0 mg/dL   Nitrite NEGATIVE NEGATIVE   Leukocytes, UA NEGATIVE NEGATIVE  Pregnancy, urine POC  Result Value Ref Range   Preg Test, Ur NEGATIVE NEGATIVE    Labs Reviewed  POCT URINALYSIS DIP (DEVICE) - Abnormal; Notable for the following components:      Result Value   Hgb urine dipstick SMALL (*)    Protein, ur 30 (*)    All other components within normal limits  POCT PREGNANCY, URINE  URINE CYTOLOGY ANCILLARY ONLY    Allergies  Allergen Reactions  . Penicillins Other (See Comments)    Sharp pain in temple   . Latex Itching, Swelling and Rash    Past Medical History:  Diagnosis Date  . Diabetes mellitus without complication (HCC)   . Hypertension   . Pyelonephritis   . Renal disorder    pyelonephritis  . Seizures (HCC)    Family History  Problem Relation Age of Onset  . Kidney disease Mother   . Hypertension Mother   . Sickle cell anemia Father   .  Hyperlipidemia Maternal Grandmother    Social History   Socioeconomic History  . Marital status: Single    Spouse name: Not on file  . Number of children: Not on file  . Years of education: Not on file  . Highest education level: Not on file  Occupational History  . Not on file  Social Needs  . Financial resource strain: Not on file  . Food insecurity:    Worry: Not on file    Inability: Not on file  . Transportation needs:    Medical: Not on file    Non-medical: Not on file  Tobacco Use  . Smoking status: Current Every  Day Smoker    Packs/day: 0.50    Years: 7.00    Pack years: 3.50    Types: Cigarettes  . Smokeless tobacco: Never Used  Substance and Sexual Activity  . Alcohol use: No  . Drug use: No  . Sexual activity: Yes    Birth control/protection: None  Lifestyle  . Physical activity:    Days per week: Not on file    Minutes per session: Not on file  . Stress: Not on file  Relationships  . Social connections:    Talks on phone: Not on file    Gets together: Not on file    Attends religious service: Not on file    Active member of club or organization: Not on file    Attends meetings of clubs or organizations: Not on file    Relationship status: Not on file  . Intimate partner violence:    Fear of current or ex partner: Not on file    Emotionally abused: Not on file    Physically abused: Not on file    Forced sexual activity: Not on file  Other Topics Concern  . Not on file  Social History Narrative  . Not on file          Mardella Layman, MD 07/03/17 1131

## 2017-07-03 NOTE — ED Triage Notes (Signed)
Pt here for STD testing.

## 2017-07-03 NOTE — Discharge Instructions (Signed)

## 2017-07-04 LAB — URINE CYTOLOGY ANCILLARY ONLY
Chlamydia: NEGATIVE
Neisseria Gonorrhea: NEGATIVE
Trichomonas: POSITIVE — AB

## 2017-07-04 LAB — HIV ANTIBODY (ROUTINE TESTING W REFLEX): HIV Screen 4th Generation wRfx: NONREACTIVE

## 2017-07-04 LAB — RPR: RPR: NONREACTIVE

## 2017-07-05 ENCOUNTER — Telehealth (HOSPITAL_COMMUNITY): Payer: Self-pay

## 2017-07-05 LAB — URINE CYTOLOGY ANCILLARY ONLY: CANDIDA VAGINITIS: NEGATIVE

## 2017-07-05 MED ORDER — METRONIDAZOLE 500 MG PO TABS: 500.0000 mg | ORAL_TABLET | Freq: Two times a day (BID) | ORAL | 0 refills | Status: AC

## 2017-07-05 NOTE — Telephone Encounter (Signed)
Trichomonas is positive. Rx metronidazole  bid x 7d #14 no refills was sent to the pharmacy of record. Pt called and made aware.  Educated patient to refrain from sexual intercourse for 7 days to give the medicine time to work. Sexual partners need to be notified and tested/treated. Condoms may reduce risk of reinfection.  Recheck for further evaluation if symptoms are not improving. Pt verbalized understanding.

## 2017-07-05 NOTE — Telephone Encounter (Signed)
Bacterial Vaginosis test is positive.  Prescription for metronidazole was given at the urgent care visit. Pt contacted regarding results. Answered all questions. Verbalized understanding.   

## 2017-07-08 ENCOUNTER — Other Ambulatory Visit (HOSPITAL_COMMUNITY)
Admission: RE | Admit: 2017-07-08 | Discharge: 2017-07-08 | Disposition: A | Discharge status: Home / Self Care | Payer: Medicaid Other | Source: Ambulatory Visit | Attending: Obstetrics | Admitting: Obstetrics

## 2017-07-08 ENCOUNTER — Ambulatory Visit (INDEPENDENT_AMBULATORY_CARE_PROVIDER_SITE_OTHER): Payer: Medicaid Other | Admitting: Obstetrics

## 2017-07-08 ENCOUNTER — Encounter: Payer: Self-pay | Admitting: Obstetrics

## 2017-07-08 VITALS — BP 126/85 | HR 90 | Ht 70.5 in | Wt 279.0 lb

## 2017-07-08 DIAGNOSIS — Z01419 Encounter for gynecological examination (general) (routine) without abnormal findings: Secondary | ICD-10-CM

## 2017-07-08 DIAGNOSIS — F17200 Nicotine dependence, unspecified, uncomplicated: Secondary | ICD-10-CM

## 2017-07-08 DIAGNOSIS — Z0000 Encounter for general adult medical examination without abnormal findings: Secondary | ICD-10-CM

## 2017-07-08 DIAGNOSIS — Z3009 Encounter for other general counseling and advice on contraception: Secondary | ICD-10-CM

## 2017-07-08 DIAGNOSIS — Z30011 Encounter for initial prescription of contraceptive pills: Secondary | ICD-10-CM

## 2017-07-08 DIAGNOSIS — Z124 Encounter for screening for malignant neoplasm of cervix: Secondary | ICD-10-CM | POA: Insufficient documentation

## 2017-07-08 DIAGNOSIS — E669 Obesity, unspecified: Secondary | ICD-10-CM

## 2017-07-08 DIAGNOSIS — N946 Dysmenorrhea, unspecified: Secondary | ICD-10-CM

## 2017-07-08 DIAGNOSIS — Z113 Encounter for screening for infections with a predominantly sexual mode of transmission: Secondary | ICD-10-CM

## 2017-07-08 DIAGNOSIS — N939 Abnormal uterine and vaginal bleeding, unspecified: Secondary | ICD-10-CM

## 2017-07-08 MED ORDER — VARENICLINE TARTRATE 1 MG PO TABS: 1.0000 mg | ORAL_TABLET | Freq: Two times a day (BID) | ORAL | 1 refills | Status: DC

## 2017-07-08 MED ORDER — VARENICLINE TARTRATE 0.5 MG X 11 & 1 MG X 42 PO MISC: ORAL | 0 refills | Status: DC

## 2017-07-08 MED ORDER — IBUPROFEN 800 MG PO TABS: 800.0000 mg | ORAL_TABLET | Freq: Three times a day (TID) | ORAL | 5 refills | Status: DC | PRN

## 2017-07-08 MED ORDER — DROSPIRENONE-ETHINYL ESTRADIOL 3-0.02 MG PO TABS: 1.0000 | ORAL_TABLET | Freq: Every day | ORAL | 11 refills | Status: DC

## 2017-07-08 NOTE — Progress Notes (Signed)
Patient presents for her Annual Exam today.  CC: Abnormal cycles. Pt states she has been having cycles twice a month.  Pt wants to discus fertility her BF wants a baby.   LMP:07/02/2017 Last pap: 10/05/13  WNL  Contraception: None  STD Screening: per pt has already had STD Screening w/ Primary. Will only do PAP today per Dr.Harper. Pt agreeable .

## 2017-07-09 ENCOUNTER — Encounter: Payer: Self-pay | Admitting: Obstetrics

## 2017-07-09 NOTE — Progress Notes (Signed)
Subjective:        Joanne Waller is a 32 y.o. female here for a routine exam.  Current complaints: Irregular periods.    Personal health questionnaire:  Is patient Ashkenazi Jewish, have a family history of breast and/or ovarian cancer: no Is there a family history of uterine cancer diagnosed at age < 95, gastrointestinal cancer, urinary tract cancer, family member who is a Personnel officer syndrome-associated carrier: no Is the patient overweight and hypertensive, family history of diabetes, personal history of gestational diabetes, preeclampsia or PCOS: no Is patient over 69, have PCOS,  family history of premature CHD under age 36, diabetes, smoke, have hypertension or peripheral artery disease:  no At any time, has a partner hit, kicked or otherwise hurt or frightened you?: no Over the past 2 weeks, have you felt down, depressed or hopeless?: no Over the past 2 weeks, have you felt little interest or pleasure in doing things?:no   Gynecologic History Patient's last menstrual period was 07/02/2017 (approximate). Contraception: OCP (estrogen/progesterone) Last Pap: 2015. Results were: normal Last mammogram: n/a. Results were: n/a  Obstetric History OB History  Gravida Para Term Preterm AB Living  2 1 1   1 1   SAB TAB Ectopic Multiple Live Births  1       1    # Outcome Date GA Lbr Len/2nd Weight Sex Delivery Anes PTL Lv  2 SAB         DEC  1 Term         LIV    Past Medical History:  Diagnosis Date  . Diabetes mellitus without complication (HCC)   . Hypertension   . Pyelonephritis   . Renal disorder    pyelonephritis  . Seizures (HCC)     Past Surgical History:  Procedure Laterality Date  . CHOLECYSTECTOMY       Current Outpatient Medications:  .  cetirizine-pseudoephedrine (ZYRTEC-D) 5-120 MG tablet, Take 1 tablet by mouth daily., Disp: 30 tablet, Rfl: 0 .  fluticasone (FLONASE) 50 MCG/ACT nasal spray, Place 2 sprays into both nostrils daily., Disp: 16 g, Rfl: 0 .   Liraglutide (VICTOZA [place]), Inject into the skin., Disp: , Rfl:  .  LOSARTAN POTASSIUM-HCTZ PO, Take by mouth., Disp: , Rfl:  .  metFORMIN (GLUCOPHAGE) 500 MG tablet, Take by mouth 2 (two) times daily with a meal., Disp: , Rfl:  .  metroNIDAZOLE (FLAGYL) 500 MG tablet, Take 1 tablet (500 mg total) by mouth 2 (two) times daily for 7 days., Disp: 14 tablet, Rfl: 0 .  benzonatate (TESSALON) 100 MG capsule, Take 1 capsule (100 mg total) by mouth every 8 (eight) hours., Disp: 21 capsule, Rfl: 0 .  drospirenone-ethinyl estradiol (YAZ) 3-0.02 MG tablet, Take 1 tablet by mouth daily., Disp: 1 Package, Rfl: 11 .  glimepiride (AMARYL) 4 MG tablet, Take 4 mg by mouth daily with breakfast., Disp: , Rfl:  .  HYDROCHLOROTHIAZIDE PO, Take by mouth., Disp: , Rfl:  .  ibuprofen (ADVIL,MOTRIN) 800 MG tablet, Take 1 tablet (800 mg total) by mouth every 8 (eight) hours as needed., Disp: 30 tablet, Rfl: 5 .  losartan (COZAAR) 25 MG tablet, , Disp: , Rfl: 5 .  LOSARTAN POTASSIUM PO, Take by mouth., Disp: , Rfl:  .  methocarbamol (ROBAXIN) 500 MG tablet, Take 1 tablet (500 mg total) by mouth 2 (two) times daily. (Patient not taking: Reported on 07/08/2017), Disp: 20 tablet, Rfl: 0 .  naproxen (NAPROSYN) 500 MG tablet, Take 1 tablet (500  mg total) by mouth 2 (two) times daily. (Patient not taking: Reported on 07/08/2017), Disp: 16 tablet, Rfl: 0 .  ondansetron (ZOFRAN-ODT) 8 MG disintegrating tablet, Take 1 tablet (8 mg total) by mouth every 8 (eight) hours as needed for nausea. (Patient not taking: Reported on 07/08/2017), Disp: 12 tablet, Rfl: 0 .  varenicline (CHANTIX CONTINUING MONTH PAK) 1 MG tablet, Take 1 tablet (1 mg total) by mouth 2 (two) times daily., Disp: 60 tablet, Rfl: 1 .  varenicline (CHANTIX STARTING MONTH PAK) 0.5 MG X 11 & 1 MG X 42 tablet, Take one 0.5 mg tablet by mouth once daily for 3 days, then increase to one 0.5 mg tablet twice daily for 4 days, then increase to one 1 mg tablet twice daily., Disp: 53  tablet, Rfl: 0 Allergies  Allergen Reactions  . Penicillins Other (See Comments)    Sharp pain in temple   . Latex Itching, Swelling and Rash    Social History   Tobacco Use  . Smoking status: Current Every Day Smoker    Packs/day: 0.50    Years: 7.00    Pack years: 3.50    Types: Cigarettes  . Smokeless tobacco: Never Used  Substance Use Topics  . Alcohol use: No    Family History  Problem Relation Age of Onset  . Kidney disease Mother   . Hypertension Mother   . Sickle cell anemia Father   . Hyperlipidemia Maternal Grandmother       Review of Systems  Constitutional: negative for fatigue and weight loss Respiratory: negative for cough and wheezing Cardiovascular: negative for chest pain, fatigue and palpitations Gastrointestinal: negative for abdominal pain and change in bowel habits Musculoskeletal:negative for myalgias Neurological: negative for gait problems and tremors Behavioral/Psych: negative for abusive relationship, depression Endocrine: negative for temperature intolerance    Genitourinary:POSITIVE for abnormal menstrual periods.  NEGATIVE for genital lesions, hot flashes, sexual problems and vaginal discharge Integument/breast: negative for breast lump, breast tenderness, nipple discharge and skin lesion(s)    Objective:       BP 126/85   Pulse 90   Ht 5' 10.5" (1.791 m)   Wt 279 lb (126.6 kg)   LMP 07/02/2017 (Approximate)   BMI 39.47 kg/m  General:   alert  Skin:   no rash or abnormalities  Lungs:   clear to auscultation bilaterally  Heart:   regular rate and rhythm, S1, S2 normal, no murmur, click, rub or gallop  Breasts:   normal without suspicious masses, skin or nipple changes or axillary nodes  Abdomen:  normal findings: no organomegaly, soft, non-tender and no hernia  Pelvis:  External genitalia: normal general appearance Urinary system: urethral meatus normal and bladder without fullness, nontender Vaginal: normal without tenderness,  induration or masses Cervix: normal appearance Adnexa: normal bimanual exam Uterus: anteverted and non-tender, normal size   Lab Review Urine pregnancy test Labs reviewed yes Radiologic studies reviewed no  50% of 20 min visit spent on counseling and coordination of care.   Assessment:     1. Women's annual routine gynecological examination  2. Screening for cervical cancer Rx: - Cytology - PAP  3. Screening examination for STD (sexually transmitted disease)  4. Abnormal uterine bleeding (AUB) - O ( Anovulatory Cycles ) Rx: - US PELVIC COMPLETE WITH TRANSVAGINAL; Future  5. Encounter for other general counseling and advice on contraception  6. Encounter for initial prescription of contraceptive pills, mainly for cycle regulation Rx: - drospirenone-ethinyl estradiol (YAZ) 3-0.02 MG tablet; Take  1 tablet by mouth daily.  Dispense: 1 Package; Refill: 11  7. Tobacco dependence Rx: - varenicline (CHANTIX STARTING MONTH PAK) 0.5 MG X 11 & 1 MG X 42 tablet; Take one 0.5 mg tablet by mouth once daily for 3 days, then increase to one 0.5 mg tablet twice daily for 4 days, then increase to one 1 mg tablet twice daily.  Dispense: 53 tablet; Refill: 0 - varenicline (CHANTIX CONTINUING MONTH PAK) 1 MG tablet; Take 1 tablet (1 mg total) by mouth 2 (two) times daily.  Dispense: 60 tablet; Refill: 1  8. Dysmenorrhea Rx: - ibuprofen (ADVIL,MOTRIN) 800 MG tablet; Take 1 tablet (800 mg total) by mouth every 8 (eight) hours as needed.  Dispense: 30 tablet; Refill: 5  9. Obesity (BMI 35.0-39.9 without comorbidity) - program of caloric reduction, exercise and behavioral modification recommended      Plan:    Education reviewed: calcium supplements, depression evaluation, low fat, low cholesterol diet, safe sex/STD prevention, self breast exams, smoking cessation and weight bearing exercise. Contraception: OCP (estrogen/progesterone). Follow up in: 4 months.   Meds ordered this encounter   Medications  . varenicline (CHANTIX STARTING MONTH PAK) 0.5 MG X 11 & 1 MG X 42 tablet    Sig: Take one 0.5 mg tablet by mouth once daily for 3 days, then increase to one 0.5 mg tablet twice daily for 4 days, then increase to one 1 mg tablet twice daily.    Dispense:  53 tablet    Refill:  0  . varenicline (CHANTIX CONTINUING MONTH PAK) 1 MG tablet    Sig: Take 1 tablet (1 mg total) by mouth 2 (two) times daily.    Dispense:  60 tablet    Refill:  1  . drospirenone-ethinyl estradiol (YAZ) 3-0.02 MG tablet    Sig: Take 1 tablet by mouth daily.    Dispense:  1 Package    Refill:  11  . ibuprofen (ADVIL,MOTRIN) 800 MG tablet    Sig: Take 1 tablet (800 mg total) by mouth every 8 (eight) hours as needed.    Dispense:  30 tablet    Refill:  5   Orders Placed This Encounter  Procedures  . US PELVIC COMPLETE WITH TRANSVAGINAL    Standing Status:   Future    Standing Expiration Date:   09/08/2018    Order Specific Question:   Reason for Exam (SYMPTOM  OR DIAGNOSIS REQUIRED)    Answer:   AUB    Order Specific Question:   Preferred imaging location?    Answer:   [facility] Julio Alm MD 07-08-2017

## 2017-07-10 ENCOUNTER — Telehealth: Payer: Self-pay | Admitting: *Deleted

## 2017-07-10 ENCOUNTER — Other Ambulatory Visit: Payer: Self-pay | Admitting: Obstetrics

## 2017-07-10 DIAGNOSIS — A5901 Trichomonal vulvovaginitis: Secondary | ICD-10-CM

## 2017-07-10 LAB — CYTOLOGY - PAP
DIAGNOSIS: NEGATIVE
HPV (WINDOPATH): NOT DETECTED

## 2017-07-10 MED ORDER — TINIDAZOLE 500 MG PO TABS: 2.0000 g | ORAL_TABLET | Freq: Once | ORAL | 0 refills | Status: AC

## 2017-07-10 NOTE — Telephone Encounter (Signed)
Pt called for results. Results given, Rx discussed. Pt made aware for need of repeat testing.

## 2017-07-12 ENCOUNTER — Ambulatory Visit (HOSPITAL_COMMUNITY)
Admission: RE | Admit: 2017-07-12 | Discharge: 2017-07-12 | Disposition: A | Discharge status: Home / Self Care | Payer: Medicaid Other | Source: Ambulatory Visit | Attending: Obstetrics | Admitting: Obstetrics

## 2017-07-12 DIAGNOSIS — N939 Abnormal uterine and vaginal bleeding, unspecified: Secondary | ICD-10-CM | POA: Insufficient documentation

## 2017-07-15 ENCOUNTER — Telehealth (HOSPITAL_COMMUNITY): Payer: Self-pay | Admitting: Emergency Medicine

## 2017-07-15 MED ORDER — METRONIDAZOLE 500 MG PO TABS: 500.0000 mg | ORAL_TABLET | Freq: Two times a day (BID) | ORAL | 0 refills | Status: DC

## 2017-07-15 MED ORDER — ONDANSETRON HCL 4 MG PO TABS: 4.0000 mg | ORAL_TABLET | Freq: Three times a day (TID) | ORAL | 0 refills | Status: DC | PRN

## 2017-07-15 NOTE — Telephone Encounter (Signed)
Resent RX for flagyl and also for zofran

## 2017-07-16 ENCOUNTER — Telehealth: Payer: Self-pay | Admitting: *Deleted

## 2017-07-16 NOTE — Telephone Encounter (Signed)
Pt called to office for u/s results. Pt made aware no fibroids / problems noted on results.  Pt made aware message to be sent to provider to make sure u/s has been reviewed and for further recommendations.    Please advise.

## 2017-10-30 ENCOUNTER — Ambulatory Visit (INDEPENDENT_AMBULATORY_CARE_PROVIDER_SITE_OTHER): Payer: Medicaid Other

## 2017-10-30 ENCOUNTER — Encounter: Payer: Self-pay | Admitting: Podiatry

## 2017-10-30 ENCOUNTER — Ambulatory Visit: Payer: Medicaid Other | Admitting: Podiatry

## 2017-10-30 DIAGNOSIS — M2011 Hallux valgus (acquired), right foot: Secondary | ICD-10-CM

## 2017-10-30 DIAGNOSIS — M2012 Hallux valgus (acquired), left foot: Secondary | ICD-10-CM

## 2017-10-30 NOTE — Progress Notes (Signed)
   Subjective:    Patient ID: Joanne Waller, female    DOB: 12-04-1985, 32 y.o.   MRN: [id number]  HPI    Review of Systems  All other systems reviewed and are negative.      Objective:   Physical Exam        Assessment & Plan:

## 2017-10-30 NOTE — Patient Instructions (Addendum)

## 2017-11-02 NOTE — Progress Notes (Signed)
Subjective:   Patient ID: Joanne Waller, female   DOB: 32 y.o.   MRN: [id number]   HPI Patient presents with significant bunion deformity bilateral and flatfoot deformity and is a diabetic for the last number of years.  Patient has not been in good control recently with A1c around 12 and is now at 10.  Patient does smoke half pack per day and would like to be more active   Review of Systems  All other systems reviewed and are negative.       Objective:  Physical Exam  Constitutional: She appears well-developed and well-nourished.  Cardiovascular: Intact distal pulses.  Pulmonary/Chest: Effort normal.  Musculoskeletal: Normal range of motion.  Neurological: She is alert.  Skin: Skin is warm.  Nursing note and vitals reviewed.   Neurovascular status was found to be intact muscle strength is adequate with moderate flatfoot deformity noted.  Patient is found to have large bunion deformity bilateral with redness and irritation around the head and pain with palpation.  Patient has good digital perfusion well oriented x3     Assessment:  Diabetic was not taking very good care of herself with bunion deformity left over right that is moderately painful when pressed     Plan:  H&P conditions reviewed and discussed correction.  I want her to get her sugar under better control before I would consider it and she is going to see her physician and hopefully this spring we can get this corrected for her if her A1c can get down to get approval.  She is to work on this to her past and also long-term I want to make her orthotics which I reviewed today.  X-ray indicates structural bunion deformity bilateral with significant flatfoot deformity bilateral

## 2017-12-10 ENCOUNTER — Ambulatory Visit (HOSPITAL_COMMUNITY)
Admission: EM | Admit: 2017-12-10 | Discharge: 2017-12-10 | Disposition: A | Discharge status: Other Acute Inpatient Hospital | Payer: Medicaid Other

## 2017-12-10 ENCOUNTER — Encounter (HOSPITAL_COMMUNITY): Payer: Self-pay | Admitting: Emergency Medicine

## 2017-12-10 ENCOUNTER — Emergency Department (HOSPITAL_COMMUNITY)
Admission: EM | Admit: 2017-12-10 | Discharge: 2017-12-10 | Disposition: A | Discharge status: Home / Self Care | Payer: Medicaid Other | Attending: Emergency Medicine | Admitting: Emergency Medicine

## 2017-12-10 DIAGNOSIS — R739 Hyperglycemia, unspecified: Secondary | ICD-10-CM

## 2017-12-10 DIAGNOSIS — E1165 Type 2 diabetes mellitus with hyperglycemia: Secondary | ICD-10-CM | POA: Diagnosis present

## 2017-12-10 LAB — CBG MONITORING, ED: Glucose-Capillary: 296 mg/dL — ABNORMAL HIGH (ref 70–99)

## 2017-12-10 LAB — BASIC METABOLIC PANEL
Anion gap: 9 (ref 5–15)
BUN: 8 mg/dL (ref 6–20)
CO2: 26 mmol/L (ref 22–32)
CREATININE: 0.68 mg/dL (ref 0.44–1.00)
Calcium: 9.2 mg/dL (ref 8.9–10.3)
Chloride: 98 mmol/L (ref 98–111)
GFR calc Af Amer: >60 mL/min (ref >60)
GFR calc non Af Amer: >60 mL/min (ref >60)
GLUCOSE: 276 mg/dL — AB (ref 70–99)
POTASSIUM: 4 mmol/L (ref 3.5–5.1)
SODIUM: 133 mmol/L — AB (ref 135–145)

## 2017-12-10 LAB — CBC
HEMATOCRIT: 43.5 % (ref 36.0–46.0)
HEMOGLOBIN: 13.5 g/dL (ref 12.0–15.0)
MCH: 25 pg — AB (ref 26.0–34.0)
MCHC: 31 g/dL (ref 30.0–36.0)
MCV: 80.7 fL (ref 80.0–100.0)
Platelets: 355 10*3/uL (ref 150–400)
RBC: 5.39 MIL/uL — ABNORMAL HIGH (ref 3.87–5.11)
RDW: 12.2 % (ref 11.5–15.5)
WBC: 9.3 10*3/uL (ref 4.0–10.5)
nRBC: 0 % (ref 0.0–0.2)

## 2017-12-10 LAB — I-STAT BETA HCG BLOOD, ED (MC, WL, AP ONLY): I-stat hCG, quantitative: <5 m[IU]/mL (ref <5)

## 2017-12-10 NOTE — ED Provider Notes (Signed)
Patient placed in Quick Look pathway, seen and evaluated   Chief Complaint: high blood sugar  HPI: Joanne Waller is a 32 y.o. female with hx of DM who present to the ED with c/o not feeling just right so she checked her blood sugar and it was 415 so she came to the ED. Patient reports drinking vinegar to try and lower her blood sugar.   ROS: CV: no chest pan  Resp: no shortness of breath  Neuro: "just not feeling right"  Physical Exam:  BP (!) 156/102 (BP Location: Right Arm)   Pulse 98   Temp 98 F (36.7 C) (Oral)   Resp 16   SpO2 100%    Gen: No distress  Neuro: Awake and Alert  Skin: Warm and dry  CBG on arrival here 296   Initiation of care has begun. The patient has been counseled on the process, plan, and necessity for staying for the completion/evaluation, and the remainder of the medical screening examination    Janne Napoleon, NP 12/10/17 1415    Linwood Dibbles, MD 12/18/17 1104

## 2017-12-10 NOTE — ED Triage Notes (Signed)
Pt arrives to ED for elevated CBG pt took it at home and was 415. Pt has type II diabetes.

## 2017-12-10 NOTE — ED Triage Notes (Signed)
Per provider pt to go to ED for eval 

## 2017-12-10 NOTE — ED Notes (Addendum)
Patient at [facility] stating "I am leaving, my sugar has come down. I need to get back to work." Pt encouraged to stay so that she may be seen by a provider, she declined.

## 2017-12-14 ENCOUNTER — Encounter: Payer: Self-pay | Admitting: Podiatry

## 2018-02-04 ENCOUNTER — Encounter (HOSPITAL_COMMUNITY): Payer: Self-pay

## 2018-02-04 ENCOUNTER — Telehealth (HOSPITAL_COMMUNITY): Payer: Self-pay | Admitting: Emergency Medicine

## 2018-02-04 ENCOUNTER — Ambulatory Visit (HOSPITAL_COMMUNITY)
Admission: EM | Admit: 2018-02-04 | Discharge: 2018-02-04 | Disposition: A | Discharge status: Home / Self Care | Payer: Medicaid Other | Attending: Family Medicine | Admitting: Family Medicine

## 2018-02-04 DIAGNOSIS — Z880 Allergy status to penicillin: Secondary | ICD-10-CM | POA: Insufficient documentation

## 2018-02-04 DIAGNOSIS — R112 Nausea with vomiting, unspecified: Secondary | ICD-10-CM | POA: Diagnosis present

## 2018-02-04 DIAGNOSIS — Z8249 Family history of ischemic heart disease and other diseases of the circulatory system: Secondary | ICD-10-CM | POA: Diagnosis not present

## 2018-02-04 DIAGNOSIS — N912 Amenorrhea, unspecified: Secondary | ICD-10-CM | POA: Diagnosis not present

## 2018-02-04 DIAGNOSIS — F17210 Nicotine dependence, cigarettes, uncomplicated: Secondary | ICD-10-CM | POA: Insufficient documentation

## 2018-02-04 DIAGNOSIS — I10 Essential (primary) hypertension: Secondary | ICD-10-CM | POA: Insufficient documentation

## 2018-02-04 DIAGNOSIS — E1165 Type 2 diabetes mellitus with hyperglycemia: Secondary | ICD-10-CM | POA: Insufficient documentation

## 2018-02-04 LAB — HCG, QUANTITATIVE, PREGNANCY: hCG, Beta Chain, Quant, S: <1 m[IU]/mL (ref <5)

## 2018-02-04 LAB — GLUCOSE, CAPILLARY: Glucose-Capillary: 287 mg/dL — ABNORMAL HIGH (ref 70–99)

## 2018-02-04 MED ORDER — ONDANSETRON 4 MG PO TBDP
ORAL_TABLET | ORAL | Status: AC
  Filled 2018-02-04: qty 1

## 2018-02-04 MED ORDER — ONDANSETRON 4 MG PO TBDP: 4.0000 mg | ORAL_TABLET | Freq: Three times a day (TID) | ORAL | 0 refills | Status: DC | PRN

## 2018-02-04 MED ORDER — ONDANSETRON 4 MG PO TBDP
4.0000 mg | ORAL_TABLET | Freq: Once | ORAL | Status: AC
  Administered 2018-02-04: 4 mg via ORAL

## 2018-02-04 NOTE — Telephone Encounter (Signed)
Pt called requesting results; results given to pt and pt verbalized understanding

## 2018-02-04 NOTE — ED Triage Notes (Signed)
Pt presents with nausea and vomiting. 

## 2018-02-04 NOTE — Discharge Instructions (Signed)

## 2018-02-04 NOTE — ED Provider Notes (Signed)
[facility]   [id number] 02/04/18 Arrival Time: 1008  ASSESSMENT & PLAN:  1. Intractable vomiting with nausea, unspecified vomiting type   2. Amenorrhea   3. Uncontrolled type 2 diabetes mellitus with hyperglycemia (HCC)    Reports her blood sugars do occasionally run between 200-300. Checks regularly. Benign abdominal exam. No need for abdominal imaging at this time. Unable to urinate for UPT.  Pending: . hCG, quantitative, pregnancy    Standing Status:   Standing    Number of Occurrences:   1   If pregnancy test negative, this is likely viral illness. Discussed.  Meds ordered this encounter  Medications  . ondansetron (ZOFRAN-ODT) 4 MG disintegrating tablet    Sig: Take 1 tablet (4 mg total) by mouth every 8 (eight) hours as needed for nausea or vomiting.    Dispense:  15 tablet    Refill:  0  . ondansetron (ZOFRAN-ODT) disintegrating tablet 4 mg     Discharge Instructions     Please do your best to ensure adequate fluid intake in order to avoid dehydration. If you find that you are unable to tolerate drinking fluids regularly please proceed to the Emergency Department for evaluation.  Also, you should return to the hospital if you experience persistent fevers for greater than 1-2 more days, increasing abdominal pain that persists despite medications, persistent diarrhea, dizziness, syncope (fainting), or for any other concerns you may deem worrisome.     Discussed typical duration of symptoms for suspected viral GI illness. Will do her best to ensure adequate fluid intake in order to avoid dehydration. Will proceed to the Emergency Department for evaluation if unable to tolerate PO fluids regularly.  Otherwise she will f/u with her PCP or here if not showing improvement over the next 48-72 hours.  Reviewed expectations re: course of current medical issues. Questions answered. Outlined signs and symptoms indicating need for more acute  intervention. Patient verbalized understanding. After Visit Summary given.   SUBJECTIVE: History from: patient.  Joanne Waller is a 32 y.o. female who presents with complaint of non-bloody intermittent nausea and vomiting without diarrhea. Onset 4-5 hours ago. Abdominal discomfort: none. Symptoms are unchanged since beginning. Aggravating factors: eating. Alleviating factors: none identified. Associated symptoms: mild fatigue. No headache or visual changes. She denies arthralgias, constipation, dysuria, fever, hematuria and sweats. Appetite: decreased. PO intake: decreased. Ambulatory without assistance. Urinary symptoms: none. Last bowel movement yesterday without blood. Sick contacts: none. Recent travel or camping: none. OTC treatment: none.  No LMP recorded. (Menstrual status: Irregular Periods). "Not sure when my last period was." Sexually active with single female partner.  Past Surgical History:  Procedure Laterality Date  . CHOLECYSTECTOMY     ROS: As per HPI. All other systems negative.   OBJECTIVE:  Vitals:   02/04/18 1203  BP: 137/86  Pulse: (!) 107  Resp: 20  Temp: 98.4 F (36.9 C)  TempSrc: Oral  SpO2: 99%    Tachycardia noted.  General appearance: alert; no distress Oropharynx: moist Lungs: clear to auscultation bilaterally; unlabored Heart: regular rhythm Abdomen: obese; soft; non-distended; no significant abdominal tenderness; bowel sounds present; no masses or organomegaly; no guarding or rebound tenderness Back: no CVA tenderness Extremities: no edema; symmetrical with no gross deformities Skin: warm; dry Neurologic: normal gait Psychological: alert and cooperative; normal mood and affect  Labs: Labs Reviewed  GLUCOSE, CAPILLARY - Abnormal; Notable for the following components:      Result Value   Glucose-Capillary 287 (*)  All other components within normal limits  HCG, QUANTITATIVE, PREGNANCY  CBG MONITORING, ED    Allergies  Allergen  Reactions  . Amoxicillin Other (See Comments)    Pt stated, "I get a severe blinding headache"  . Penicillins Other (See Comments)    Sharp pain in temple   . Latex Itching, Swelling and Rash                                               Past Medical History:  Diagnosis Date  . Diabetes mellitus without complication (HCC)   . Hypertension   . Pyelonephritis   . Renal disorder    pyelonephritis  . Seizures (HCC)    Social History   Socioeconomic History  . Marital status: Single    Spouse name: Not on file  . Number of children: Not on file  . Years of education: Not on file  . Highest education level: Not on file  Occupational History  . Not on file  Social Needs  . Financial resource strain: Not on file  . Food insecurity:    Worry: Not on file    Inability: Not on file  . Transportation needs:    Medical: Not on file    Non-medical: Not on file  Tobacco Use  . Smoking status: Current Every Day Smoker    Packs/day: 0.50    Years: 7.00    Pack years: 3.50    Types: Cigarettes  . Smokeless tobacco: Never Used  Substance and Sexual Activity  . Alcohol use: No  . Drug use: No  . Sexual activity: Yes    Birth control/protection: None  Lifestyle  . Physical activity:    Days per week: Not on file    Minutes per session: Not on file  . Stress: Not on file  Relationships  . Social connections:    Talks on phone: Not on file    Gets together: Not on file    Attends religious service: Not on file    Active member of club or organization: Not on file    Attends meetings of clubs or organizations: Not on file    Relationship status: Not on file  . Intimate partner violence:    Fear of current or ex partner: Not on file    Emotionally abused: Not on file    Physically abused: Not on file    Forced sexual activity: Not on file  Other Topics Concern  . Not on file  Social History Narrative  . Not on file   Family History  Problem Relation Age of Onset  .  Kidney disease Mother   . Hypertension Mother   . Sickle cell anemia Father   . Hyperlipidemia Maternal Dulce SellarGrandmother      Meliya Mcconahy, MD 02/04/18 [phone]

## 2018-04-10 ENCOUNTER — Encounter: Payer: Self-pay | Admitting: Obstetrics

## 2018-04-10 ENCOUNTER — Other Ambulatory Visit (HOSPITAL_COMMUNITY)
Admission: RE | Admit: 2018-04-10 | Discharge: 2018-04-10 | Disposition: A | Discharge status: Home / Self Care | Payer: Medicaid Other | Source: Ambulatory Visit | Attending: Obstetrics | Admitting: Obstetrics

## 2018-04-10 ENCOUNTER — Ambulatory Visit: Payer: Medicaid Other | Admitting: Obstetrics

## 2018-04-10 VITALS — BP 127/85 | HR 106 | Wt 281.0 lb

## 2018-04-10 DIAGNOSIS — N760 Acute vaginitis: Secondary | ICD-10-CM

## 2018-04-10 DIAGNOSIS — B9689 Other specified bacterial agents as the cause of diseases classified elsewhere: Secondary | ICD-10-CM | POA: Diagnosis not present

## 2018-04-10 DIAGNOSIS — N898 Other specified noninflammatory disorders of vagina: Secondary | ICD-10-CM | POA: Diagnosis not present

## 2018-04-10 DIAGNOSIS — B373 Candidiasis of vulva and vagina: Secondary | ICD-10-CM | POA: Diagnosis not present

## 2018-04-10 DIAGNOSIS — B3731 Acute candidiasis of vulva and vagina: Secondary | ICD-10-CM

## 2018-04-10 NOTE — Progress Notes (Signed)
Pt is here for vaginal irritation, has been going on for two weeks.

## 2018-04-10 NOTE — Progress Notes (Signed)
Patient ID: Joanne Waller, female   DOB: 11-12-1985, 33 y.o.   MRN: [id number]  Chief Complaint  Patient presents with  . Vaginitis    HPI Joanne Waller is a 33 y.o. female.  Vaginal itching and discharge. HPI  Past Medical History:  Diagnosis Date  . Diabetes mellitus without complication (HCC)   . Hypertension   . Pyelonephritis   . Renal disorder    pyelonephritis  . Seizures (HCC)     Past Surgical History:  Procedure Laterality Date  . CHOLECYSTECTOMY      Family History  Problem Relation Age of Onset  . Kidney disease Mother   . Hypertension Mother   . Sickle cell anemia Father   . Hyperlipidemia Maternal Grandmother     Social History Social History   Tobacco Use  . Smoking status: Current Every Day Smoker    Packs/day: 0.50    Years: 7.00    Pack years: 3.50    Types: Cigarettes  . Smokeless tobacco: Never Used  Substance Use Topics  . Alcohol use: No  . Drug use: No    Allergies  Allergen Reactions  . Amoxicillin Other (See Comments)    Pt stated, "I get a severe blinding headache"  . Penicillins Other (See Comments)    Sharp pain in temple   . Latex Itching, Swelling and Rash    Current Outpatient Medications  Medication Sig Dispense Refill  . JANUVIA 100 MG tablet Take 100 mg by mouth daily.  5  . LOSARTAN POTASSIUM-HCTZ PO Take by mouth.    . metFORMIN (GLUCOPHAGE) 500 MG tablet Take by mouth 2 (two) times daily with a meal.    . cetirizine-pseudoephedrine (ZYRTEC-D) 5-120 MG tablet Take 1 tablet by mouth daily. (Patient not taking: Reported on 04/10/2018) 30 tablet 0  . drospirenone-ethinyl estradiol (YAZ) 3-0.02 MG tablet Take 1 tablet by mouth daily. (Patient not taking: Reported on 04/10/2018) 1 Package 11  . fluconazole (DIFLUCAN) 150 MG tablet Take 1 tablet (150 mg total) by mouth once for 1 dose. 1 tablet 0  . fluticasone (FLONASE) 50 MCG/ACT nasal spray Place 2 sprays into both nostrils daily. (Patient not taking: Reported on 04/10/2018)  16 g 0  . glimepiride (AMARYL) 4 MG tablet Take 4 mg by mouth daily with breakfast.    . HYDROCHLOROTHIAZIDE PO Take by mouth.    Marland Kitchen ibuprofen (ADVIL,MOTRIN) 800 MG tablet Take 1 tablet (800 mg total) by mouth every 8 (eight) hours as needed. (Patient not taking: Reported on 04/10/2018) 30 tablet 5  . Liraglutide (VICTOZA [place]) Inject into the skin.    Marland Kitchen losartan (COZAAR) 25 MG tablet   5  . LOSARTAN POTASSIUM PO Take by mouth.    . methocarbamol (ROBAXIN) 500 MG tablet Take 1 tablet (500 mg total) by mouth 2 (two) times daily. (Patient not taking: Reported on 04/10/2018) 20 tablet 0  . naproxen (NAPROSYN) 500 MG tablet Take 1 tablet (500 mg total) by mouth 2 (two) times daily. (Patient not taking: Reported on 04/10/2018) 16 tablet 0  . ondansetron (ZOFRAN) 4 MG tablet Take 1 tablet (4 mg total) by mouth every 8 (eight) hours as needed for nausea or vomiting. (Patient not taking: Reported on 04/10/2018) 20 tablet 0  . ondansetron (ZOFRAN-ODT) 4 MG disintegrating tablet Take 1 tablet (4 mg total) by mouth every 8 (eight) hours as needed for nausea or vomiting. (Patient not taking: Reported on 04/10/2018) 15 tablet 0  . tinidazole (TINDAMAX) 500 MG tablet  Take 2 tablets (1,000 mg total) by mouth daily with breakfast. 10 tablet 2  . varenicline (CHANTIX CONTINUING MONTH PAK) 1 MG tablet Take 1 tablet (1 mg total) by mouth 2 (two) times daily. (Patient not taking: Reported on 04/10/2018) 60 tablet 1  . varenicline (CHANTIX STARTING MONTH PAK) 0.5 MG X 11 & 1 MG X 42 tablet Take one 0.5 mg tablet by mouth once daily for 3 days, then increase to one 0.5 mg tablet twice daily for 4 days, then increase to one 1 mg tablet twice daily. (Patient not taking: Reported on 04/10/2018) 53 tablet 0   No current facility-administered medications for this visit.     Review of Systems Review of Systems Constitutional: negative for fatigue and weight loss Respiratory: negative for cough and wheezing Cardiovascular: negative for  chest pain, fatigue and palpitations Gastrointestinal: negative for abdominal pain and change in bowel habits Genitourinary:positive for vaginal itching and discharge Integument/breast: negative for nipple discharge Musculoskeletal:negative for myalgias Neurological: negative for gait problems and tremors Behavioral/Psych: negative for abusive relationship, depression Endocrine: negative for temperature intolerance      Blood pressure 127/85, pulse (!) 106, weight 281 lb (127.5 kg), last menstrual period 03/06/2018.  Physical Exam Physical Exam           General:  Alert and no distress Abdomen:  normal findings: no organomegaly, soft, non-tender and no hernia  Pelvis:  External genitalia: normal general appearance Urinary system: urethral meatus normal and bladder without fullness, nontender Vaginal: normal without tenderness, induration or masses Cervix: normal appearance Adnexa: normal bimanual exam Uterus: anteverted and non-tender, normal size    50% of 15 min visit spent on counseling and coordination of care.   Data Reviewed Wet Prep and Cultures  Assessment       1. Acute vaginitis  2. Vaginal discharge Rx: - Cervicovaginal ancillary only( [place])  3. BV (bacterial vaginosis) - Tinidazole Rx  4. Candida vaginitis - Diflucan Rx  5. Candidal vulvitis - Clotrimazole Rx   Plan    Follow up in 3 months   No orders of the defined types were placed in this encounter.  No orders of the defined types were placed in this encounter.   Brock Bad MD 04-10-2018

## 2018-04-11 ENCOUNTER — Other Ambulatory Visit: Payer: Self-pay | Admitting: Obstetrics

## 2018-04-11 DIAGNOSIS — B9689 Other specified bacterial agents as the cause of diseases classified elsewhere: Secondary | ICD-10-CM

## 2018-04-11 DIAGNOSIS — B373 Candidiasis of vulva and vagina: Secondary | ICD-10-CM

## 2018-04-11 DIAGNOSIS — B3731 Acute candidiasis of vulva and vagina: Secondary | ICD-10-CM

## 2018-04-11 DIAGNOSIS — N760 Acute vaginitis: Principal | ICD-10-CM

## 2018-04-11 LAB — CERVICOVAGINAL ANCILLARY ONLY
Bacterial vaginitis: POSITIVE — AB
Candida vaginitis: POSITIVE — AB
Chlamydia: NEGATIVE
Neisseria Gonorrhea: NEGATIVE
Trichomonas: NEGATIVE

## 2018-04-11 MED ORDER — FLUCONAZOLE 150 MG PO TABS: 150.0000 mg | ORAL_TABLET | Freq: Once | ORAL | 0 refills | Status: DC

## 2018-04-11 MED ORDER — TINIDAZOLE 500 MG PO TABS: 1000.0000 mg | ORAL_TABLET | Freq: Every day | ORAL | 2 refills | Status: DC

## 2018-04-18 ENCOUNTER — Ambulatory Visit (HOSPITAL_COMMUNITY)
Admission: EM | Admit: 2018-04-18 | Discharge: 2018-04-18 | Disposition: A | Discharge status: Home / Self Care | Payer: Medicaid Other

## 2018-04-18 ENCOUNTER — Other Ambulatory Visit: Payer: Self-pay

## 2018-04-20 ENCOUNTER — Encounter (HOSPITAL_COMMUNITY): Payer: Self-pay

## 2018-04-20 ENCOUNTER — Other Ambulatory Visit: Payer: Self-pay

## 2018-04-20 ENCOUNTER — Ambulatory Visit (HOSPITAL_COMMUNITY)
Admission: EM | Admit: 2018-04-20 | Discharge: 2018-04-20 | Disposition: A | Discharge status: Home / Self Care | Payer: Medicaid Other | Attending: Family Medicine | Admitting: Family Medicine

## 2018-04-20 DIAGNOSIS — R0981 Nasal congestion: Secondary | ICD-10-CM | POA: Diagnosis not present

## 2018-04-20 DIAGNOSIS — Z3202 Encounter for pregnancy test, result negative: Secondary | ICD-10-CM | POA: Diagnosis not present

## 2018-04-20 DIAGNOSIS — R110 Nausea: Secondary | ICD-10-CM

## 2018-04-20 DIAGNOSIS — R10817 Generalized abdominal tenderness: Secondary | ICD-10-CM | POA: Diagnosis not present

## 2018-04-20 DIAGNOSIS — B349 Viral infection, unspecified: Secondary | ICD-10-CM

## 2018-04-20 DIAGNOSIS — E119 Type 2 diabetes mellitus without complications: Secondary | ICD-10-CM

## 2018-04-20 DIAGNOSIS — R509 Fever, unspecified: Secondary | ICD-10-CM

## 2018-04-20 DIAGNOSIS — R05 Cough: Secondary | ICD-10-CM

## 2018-04-20 LAB — POCT URINALYSIS DIP (DEVICE)
Bilirubin Urine: NEGATIVE
Glucose, UA: 100 mg/dL — AB
Nitrite: NEGATIVE
Protein, ur: 30 mg/dL — AB
Specific Gravity, Urine: >=1.03 (ref 1.005–1.030)
Urobilinogen, UA: 0.2 mg/dL (ref 0.0–1.0)
pH: 6.5 (ref 5.0–8.0)

## 2018-04-20 LAB — GLUCOSE, CAPILLARY: Glucose-Capillary: 262 mg/dL — ABNORMAL HIGH (ref 70–99)

## 2018-04-20 LAB — POCT PREGNANCY, URINE: Preg Test, Ur: NEGATIVE

## 2018-04-20 MED ORDER — IPRATROPIUM BROMIDE 0.06 % NA SOLN: 2.0000 | Freq: Four times a day (QID) | NASAL | 0 refills | Status: DC

## 2018-04-20 MED ORDER — DICYCLOMINE HCL 20 MG PO TABS: 20.0000 mg | ORAL_TABLET | Freq: Two times a day (BID) | ORAL | 0 refills | Status: DC

## 2018-04-20 MED ORDER — ONDANSETRON 4 MG PO TBDP: 4.0000 mg | ORAL_TABLET | Freq: Three times a day (TID) | ORAL | 0 refills | Status: DC | PRN

## 2018-04-20 NOTE — ED Provider Notes (Signed)
[facility]    CSN: [id number] Arrival date & time: 04/20/18  1721     History   Chief Complaint Chief Complaint  Patient presents with  . Abdominal Pain    HPI Joanne Waller is a 33 y.o. female.   33 year old female comes in for 5-day history of URI symptoms.  Has had rhinorrhea, nasal congestion, sneezing, nose itching, throat irritation, cough.  Has had subjective fever with hot and cold chills.  States starting yesterday having abdominal pain and nausea.  Pain can be epigastric or suprapubic aching in sensation without obvious aggravating factor.  States laying down to make the pain feels slightly better.  Denies association with food.  Denies vomiting.  Patient states after cholecystectomy, has diarrhea on baseline, no obvious worsening.  LMP 02/26/2018, states has irregular cycles.  She is currently sexually active without any birth control use. Denies vaginal discharge, itching, pain.  States diabetes is not controlled but improving. She is not insulin dependant.  Last A1c from 12 down to 10, she is due for another A1c check.  She is taking OTC cold medication with mild relief.     Past Medical History:  Diagnosis Date  . Diabetes mellitus without complication (HCC)   . Hypertension   . Pyelonephritis   . Renal disorder    pyelonephritis  . Seizures [facility])     Patient Active Problem List   Diagnosis Date Noted  . Irregular menses 10/05/2013  . Irregular menstrual cycle 05/22/2012    Past Surgical History:  Procedure Laterality Date  . CHOLECYSTECTOMY      OB History    Gravida  2   Para  1   Term  1   Preterm      AB  1   Living  1     SAB  1   TAB      Ectopic      Multiple      Live Births  1            Home Medications    Prior to Admission medications   Medication Sig Start Date End Date Taking? Authorizing Provider  cetirizine-pseudoephedrine (ZYRTEC-D) 5-120 MG tablet Take 1 tablet by mouth daily. Patient not  taking: Reported on 04/10/2018 06/24/16   Dorena Bodo, NP  dicyclomine (BENTYL) 20 MG tablet Take 1 tablet (20 mg total) by mouth 2 (two) times daily. 04/20/18   Cathie Hoops, Dontell Mian V, PA-C  drospirenone-ethinyl estradiol (YAZ) 3-0.02 MG tablet Take 1 tablet by mouth daily. Patient not taking: Reported on 04/10/2018 07/08/17   Brock Bad, MD  fluticasone [facility]) 50 MCG/ACT nasal spray Place 2 sprays into both nostrils daily. Patient not taking: Reported on 04/10/2018 02/17/17   Isa Rankin, MD  glimepiride (AMARYL) 4 MG tablet Take 4 mg by mouth daily with breakfast.    [provider]  HYDROCHLOROTHIAZIDE PO Take by mouth.    [provider]  ibuprofen (ADVIL,MOTRIN) 800 MG tablet Take 1 tablet (800 mg total) by mouth every 8 (eight) hours as needed. Patient not taking: Reported on 04/10/2018 07/08/17   Brock Bad, MD  ipratropium (ATROVENT) 0.06 % nasal spray Place 2 sprays into both nostrils 4 (four) times daily. 04/20/18   Cathie Hoops, Taiga Lupinacci V, PA-C  JANUVIA 100 MG tablet Take 100 mg by mouth daily. 10/11/17   [provider]  Liraglutide (VICTOZA [place]) Inject into the skin.    [provider]  losartan (COZAAR) 25 MG tablet  06/03/17   [provider]  LOSARTAN POTASSIUM PO Take by mouth.    [provider]  LOSARTAN POTASSIUM-HCTZ PO Take by mouth.    [provider]  metFORMIN (GLUCOPHAGE) 500 MG tablet Take by mouth 2 (two) times daily with a meal.    [provider]  methocarbamol (ROBAXIN) 500 MG tablet Take 1 tablet (500 mg total) by mouth 2 (two) times daily. Patient not taking: Reported on 04/10/2018 01/24/17   Graciella Freer A, PA-C  naproxen (NAPROSYN) 500 MG tablet Take 1 tablet (500 mg total) by mouth 2 (two) times daily. Patient not taking: Reported on 04/10/2018 01/24/17   Graciella Freer A, PA-C  ondansetron (ZOFRAN ODT) 4 MG disintegrating tablet Take 1 tablet (4 mg total) by mouth every 8 (eight) hours as needed for  nausea or vomiting. 04/20/18   Belinda Fisher, PA-C  tinidazole (TINDAMAX) 500 MG tablet Take 2 tablets (1,000 mg total) by mouth daily with breakfast. 04/11/18   Brock Bad, MD  varenicline (CHANTIX CONTINUING MONTH PAK) 1 MG tablet Take 1 tablet (1 mg total) by mouth 2 (two) times daily. Patient not taking: Reported on 04/10/2018 07/08/17   Brock Bad, MD  varenicline (CHANTIX STARTING MONTH PAK) 0.5 MG X 11 & 1 MG X 42 tablet Take one 0.5 mg tablet by mouth once daily for 3 days, then increase to one 0.5 mg tablet twice daily for 4 days, then increase to one 1 mg tablet twice daily. Patient not taking: Reported on 04/10/2018 07/08/17   Brock Bad, MD    Family History Family History  Problem Relation Age of Onset  . Kidney disease Mother   . Hypertension Mother   . Sickle cell anemia Father   . Hyperlipidemia Maternal Grandmother     Social History Social History   Tobacco Use  . Smoking status: Current Every Day Smoker    Packs/day: 0.50    Years: 7.00    Pack years: 3.50    Types: Cigarettes  . Smokeless tobacco: Never Used  Substance Use Topics  . Alcohol use: No  . Drug use: No     Allergies   Amoxicillin; Penicillins; and Latex   Review of Systems Review of Systems  Reason unable to perform ROS: See HPI as above.     Physical Exam Triage Vital Signs ED Triage Vitals  Enc Vitals Group     BP 04/20/18 1735 116/81     Pulse Rate 04/20/18 1735 (!) 112     Resp 04/20/18 1735 18     Temp 04/20/18 1735 98.4 F (36.9 C)     Temp Source 04/20/18 1735 Tympanic     SpO2 04/20/18 1735 100 %     Weight 04/20/18 1733 280 lb (127 kg)     Height --      Head Circumference --      Peak Flow --      Pain Score 04/20/18 1733 6     Pain Loc --      Pain Edu? --      Excl. in GC? --    No data found.  Updated Vital Signs BP 116/81 (BP Location: Left Arm)   Pulse (!) 112   Temp 98.4 F (36.9 C) (Tympanic)   Resp 18   Wt 280 lb (127 kg)   LMP 02/26/2018    SpO2 100%   BMI 39.61 kg/m   Physical Exam Constitutional:      General: She  is not in acute distress.    Appearance: She is well-developed. She is not ill-appearing, toxic-appearing or diaphoretic.  HENT:     Head: Normocephalic and atraumatic.     Right Ear: Tympanic membrane, ear canal and external ear normal. Tympanic membrane is not erythematous or bulging.     Left Ear: Tympanic membrane, ear canal and external ear normal. Tympanic membrane is not erythematous or bulging.     Nose: Nose normal.     Right Sinus: No maxillary sinus tenderness or frontal sinus tenderness.     Left Sinus: No maxillary sinus tenderness or frontal sinus tenderness.     Mouth/Throat:     Mouth: Mucous membranes are moist.     Pharynx: Oropharynx is clear. Uvula midline.  Eyes:     Conjunctiva/sclera: Conjunctivae normal.     Pupils: Pupils are equal, round, and reactive to light.  Neck:     Musculoskeletal: Normal range of motion and neck supple.  Cardiovascular:     Rate and Rhythm: Normal rate and regular rhythm.     Heart sounds: Normal heart sounds. No murmur. No friction rub. No gallop.   Pulmonary:     Effort: Pulmonary effort is normal. No accessory muscle usage, prolonged expiration, respiratory distress or retractions.     Breath sounds: Normal breath sounds. No stridor, decreased air movement or transmitted upper airway sounds. No decreased breath sounds, wheezing, rhonchi or rales.  Abdominal:     General: A surgical scar is present. Bowel sounds are normal.     Palpations: Abdomen is soft.     Tenderness: There is generalized abdominal tenderness. There is no right CVA tenderness, left CVA tenderness, guarding or rebound.  Skin:    General: Skin is warm and dry.  Neurological:     Mental Status: She is alert and oriented to person, place, and time.     UC Treatments / Results  Labs (all labs ordered are listed, but only abnormal results are displayed) Labs Reviewed  GLUCOSE,  CAPILLARY - Abnormal; Notable for the following components:      Result Value   Glucose-Capillary 262 (*)    All other components within normal limits  POCT URINALYSIS DIP (DEVICE) - Abnormal; Notable for the following components:   Glucose, UA 100 (*)    Ketones, ur TRACE (*)    Hgb urine dipstick TRACE (*)    Protein, ur 30 (*)    Leukocytes,Ua TRACE (*)    All other components within normal limits  POC URINE PREG, ED  CBG MONITORING, ED  POCT PREGNANCY, URINE    EKG None  Radiology No results found.  Procedures Procedures (including critical care time)  Medications Ordered in UC Medications - No data to display  Initial Impression / Assessment and Plan / UC Course  I have reviewed the triage vital signs and the nursing notes.  Pertinent labs & imaging results that were available during my care of the patient were reviewed by me and considered in my medical decision making (see chart for details).    Discussed case with Dr Delton See. Patient was tachycardic at triage, which has resolved when I examined her. Urine negative for pregnancy, infection, but with 100 glucose and trace ketones. CBG 262. She is currently stable without tachycardia will tachypnea. She denies weakness, dizziness, syncope. Abdominal exam with generalized tenderness, no guarding or rebound. Based on history and exam, lower suspicion for HHS/DKA at this time. Will provide symptomatic treatment. Push fluids. Strict return precautions  given. Otherwise, follow up with PCP for further evaluation and management needed. Patient expresses understanding and agrees to plan.  Case discussed with Dr Delton See, who agrees to plan.  Final Clinical Impressions(s) / UC Diagnoses   Final diagnoses:  Viral illness   ED Prescriptions    Medication Sig Dispense Auth. Provider   ondansetron (ZOFRAN ODT) 4 MG disintegrating tablet Take 1 tablet (4 mg total) by mouth every 8 (eight) hours as needed for nausea or vomiting. 10  tablet Marikay Roads V, PA-C   dicyclomine (BENTYL) 20 MG tablet Take 1 tablet (20 mg total) by mouth 2 (two) times daily. 20 tablet Mekaila Tarnow V, PA-C   ipratropium (ATROVENT) 0.06 % nasal spray Place 2 sprays into both nostrils 4 (four) times daily. 15 mL Threasa Alpha, [place] 04/20/18 1832

## 2018-04-20 NOTE — ED Triage Notes (Addendum)
Pt cc stomach ache, cough and congestion. X 5 days. Pt has been taking Nyquil and Dayquil.

## 2018-04-20 NOTE — Discharge Instructions (Addendum)
No alarming symptoms on exam. Atrovent nasal spray for nasal congestion/drainage. Zofran for nausea and vomiting as needed. Bentyl for abdominal cramping. Keep hydrated, you urine should be clear to pale yellow in color. Bland diet, advance as tolerated. Monitor for any worsening of symptoms, nausea or vomiting not controlled by medication, worsening abdominal pain, fever, go to the emergency department for further evaluation needed.

## 2018-05-07 ENCOUNTER — Other Ambulatory Visit: Payer: Self-pay

## 2018-05-07 DIAGNOSIS — B3731 Acute candidiasis of vulva and vagina: Secondary | ICD-10-CM

## 2018-05-07 DIAGNOSIS — B373 Candidiasis of vulva and vagina: Secondary | ICD-10-CM

## 2018-05-07 MED ORDER — FLUCONAZOLE 150 MG PO TABS: 150.0000 mg | ORAL_TABLET | Freq: Once | ORAL | 0 refills | Status: AC

## 2018-05-07 NOTE — Progress Notes (Signed)
Pt requested a refill for Diflucan due to symptoms of another yeast infection, discharge and itching. Rx sent to pts pharmacy, pt notified.

## 2018-08-14 ENCOUNTER — Ambulatory Visit: Payer: Medicaid Other | Admitting: Obstetrics

## 2018-08-18 ENCOUNTER — Encounter (HOSPITAL_COMMUNITY): Payer: Self-pay

## 2018-08-18 ENCOUNTER — Ambulatory Visit (HOSPITAL_COMMUNITY)
Admission: EM | Admit: 2018-08-18 | Discharge: 2018-08-18 | Disposition: A | Discharge status: Home / Self Care | Payer: Medicaid Other | Attending: Emergency Medicine | Admitting: Emergency Medicine

## 2018-08-18 ENCOUNTER — Telehealth: Payer: Self-pay | Admitting: General Practice

## 2018-08-18 ENCOUNTER — Other Ambulatory Visit: Payer: Self-pay

## 2018-08-18 DIAGNOSIS — Z20828 Contact with and (suspected) exposure to other viral communicable diseases: Secondary | ICD-10-CM

## 2018-08-18 DIAGNOSIS — Z20822 Contact with and (suspected) exposure to covid-19: Secondary | ICD-10-CM

## 2018-08-18 DIAGNOSIS — F17210 Nicotine dependence, cigarettes, uncomplicated: Secondary | ICD-10-CM

## 2018-08-18 DIAGNOSIS — I10 Essential (primary) hypertension: Secondary | ICD-10-CM

## 2018-08-18 DIAGNOSIS — J3489 Other specified disorders of nose and nasal sinuses: Secondary | ICD-10-CM | POA: Diagnosis not present

## 2018-08-18 MED ORDER — GUAIFENESIN ER 600 MG PO TB12: 600.0000 mg | ORAL_TABLET | Freq: Two times a day (BID) | ORAL | 0 refills | Status: AC

## 2018-08-18 NOTE — Telephone Encounter (Signed)
Pt has been scheduled for covid testing.  Pt was referred by: Sharion Balloon, NP

## 2018-08-18 NOTE — ED Triage Notes (Signed)
Patient presents to Urgent Care with complaints of sinus pressure, runny nose, sore throat, cough, and sneezing since 3 days ago. Patient reports she thinks she has a sinus infection, denies sick contacts or fever.

## 2018-08-18 NOTE — Addendum Note (Signed)
Addended by: Dimple Nanas on: 08/18/2018 12:32 PM   Modules accepted: Orders

## 2018-08-18 NOTE — Discharge Instructions (Signed)
Take the prescribed Mucinex as needed for your sinus pressure and other symptoms.  You can also take over-the-counter ibuprofen or Tylenol.  Your COVID test has been ordered.  You should receive a phone call in the next few minutes to schedule this test.  You should self quarantine until your test results come back negative.  Return here or follow-up with your primary care provider if your symptoms worsen.

## 2018-08-18 NOTE — ED Provider Notes (Signed)
[place]    CSN: [id number] Arrival date & time: 08/18/18  6195      History   Chief Complaint Chief Complaint  Patient presents with  . Sinusitis    HPI Joanne Waller is a 33 y.o. female.   Patient presents with 3-day history of sinus pressure, sore throat, runny nose, nonproductive cough, sneezing.  She states she thinks she has a sinus infection.  She denies fever, chills, vomiting, diarrhea, shortness of breath, chest pain. LMP: 2 weeks.    The history is provided by the patient.    Past Medical History:  Diagnosis Date  . Diabetes mellitus without complication ([place])   . Hypertension   . Pyelonephritis   . Renal disorder    pyelonephritis  . Seizures [facility])     Patient Active Problem List   Diagnosis Date Noted  . Irregular menses 10/05/2013  . Irregular menstrual cycle 05/22/2012    Past Surgical History:  Procedure Laterality Date  . CHOLECYSTECTOMY      OB History    Gravida  2   Para  1   Term  1   Preterm      AB  1   Living  1     SAB  1   TAB      Ectopic      Multiple      Live Births  1            Home Medications    Prior to Admission medications   Medication Sig Start Date End Date Taking? Authorizing Provider  losartan (COZAAR) 25 MG tablet  06/03/17  Yes [provider]  metFORMIN (GLUCOPHAGE) 500 MG tablet Take by mouth 2 (two) times daily with a meal.   Yes [provider]  cetirizine-pseudoephedrine (ZYRTEC-D) 5-120 MG tablet Take 1 tablet by mouth daily. Patient not taking: Reported on 04/10/2018 06/24/16   Barnet Glasgow, NP  dicyclomine (BENTYL) 20 MG tablet Take 1 tablet (20 mg total) by mouth 2 (two) times daily. 04/20/18   Tasia Catchings, Amy V, PA-C  drospirenone-ethinyl estradiol (YAZ) 3-0.02 MG tablet Take 1 tablet by mouth daily. Patient not taking: Reported on 04/10/2018 07/08/17   Shelly Bombard, MD  fluticasone [facility]) 50 MCG/ACT nasal spray Place 2 sprays into both nostrils daily.  Patient not taking: Reported on 04/10/2018 02/17/17   Wynona Luna, MD  glimepiride (AMARYL) 4 MG tablet Take 4 mg by mouth daily with breakfast.    [provider]  HYDROCHLOROTHIAZIDE PO Take by mouth.    [provider]  ibuprofen (ADVIL,MOTRIN) 800 MG tablet Take 1 tablet (800 mg total) by mouth every 8 (eight) hours as needed. Patient not taking: Reported on 04/10/2018 07/08/17   Shelly Bombard, MD  ipratropium (ATROVENT) 0.06 % nasal spray Place 2 sprays into both nostrils 4 (four) times daily. 04/20/18   Tasia Catchings, Amy V, PA-C  JANUVIA 100 MG tablet Take 100 mg by mouth daily. 10/11/17   [provider]  Liraglutide (VICTOZA [place]) Inject into the skin.    [provider]  LOSARTAN POTASSIUM PO Take by mouth.    [provider]  LOSARTAN POTASSIUM-HCTZ PO Take by mouth.    [provider]  methocarbamol (ROBAXIN) 500 MG tablet Take 1 tablet (500 mg total) by mouth 2 (two) times daily. Patient not taking: Reported on 04/10/2018 01/24/17   [place] Lanius A, PA-C  naproxen (NAPROSYN) 500 MG tablet Take 1 tablet (500 mg total)  by mouth 2 (two) times daily. Patient not taking: Reported on 04/10/2018 01/24/17   Graciella FreerLayden, Lindsey A, PA-C  ondansetron (ZOFRAN ODT) 4 MG disintegrating tablet Take 1 tablet (4 mg total) by mouth every 8 (eight) hours as needed for nausea or vomiting. 04/20/18   Belinda FisherYu, Amy V, PA-C  tinidazole (TINDAMAX) 500 MG tablet Take 2 tablets (1,000 mg total) by mouth daily with breakfast. 04/11/18   Brock BadHarper, Charles A, MD  varenicline (CHANTIX CONTINUING MONTH PAK) 1 MG tablet Take 1 tablet (1 mg total) by mouth 2 (two) times daily. Patient not taking: Reported on 04/10/2018 07/08/17   Brock BadHarper, Charles A, MD  varenicline (CHANTIX STARTING MONTH PAK) 0.5 MG X 11 & 1 MG X 42 tablet Take one 0.5 mg tablet by mouth once daily for 3 days, then increase to one 0.5 mg tablet twice daily for 4 days, then increase to one 1 mg tablet twice daily. Patient not  taking: Reported on 04/10/2018 07/08/17   Brock BadHarper, Charles A, MD    Family History Family History  Problem Relation Age of Onset  . Kidney disease Mother   . Hypertension Mother   . Sickle cell anemia Father   . Hyperlipidemia Maternal Grandmother     Social History Social History   Tobacco Use  . Smoking status: Current Every Day Smoker    Packs/day: 0.50    Years: 7.00    Pack years: 3.50    Types: Cigarettes  . Smokeless tobacco: Never Used  Substance Use Topics  . Alcohol use: Yes  . Drug use: No     Allergies   Amoxicillin, Penicillins, and Latex   Review of Systems Review of Systems  Constitutional: Negative for chills and fever.  HENT: Positive for congestion, rhinorrhea, sinus pressure, sneezing and sore throat. Negative for ear pain.   Eyes: Negative for pain and visual disturbance.  Respiratory: Positive for cough. Negative for shortness of breath.   Cardiovascular: Negative for chest pain and palpitations.  Gastrointestinal: Negative for abdominal pain, diarrhea, nausea and vomiting.  Genitourinary: Negative for dysuria and hematuria.  Musculoskeletal: Negative for arthralgias and back pain.  Skin: Negative for color change and rash.  Neurological: Negative for seizures and syncope.  All other systems reviewed and are negative.    Physical Exam Triage Vital Signs ED Triage Vitals  Enc Vitals Group     BP 08/18/18 1015 (!) 134/93     Pulse Rate 08/18/18 1015 (!) 106     Resp 08/18/18 1015 16     Temp 08/18/18 1015 98.6 F (37 C)     Temp Source 08/18/18 1015 Oral     SpO2 08/18/18 1015 100 %     Weight --      Height --      Head Circumference --      Peak Flow --      Pain Score 08/18/18 1012 0     Pain Loc --      Pain Edu? --      Excl. in GC? --    No data found.  Updated Vital Signs BP (!) 134/93 (BP Location: Right Arm)   Pulse (!) 106   Temp 98.6 F (37 C) (Oral)   Resp 16   LMP 08/06/2018 (Approximate)   SpO2 100%   Visual  Acuity Right Eye Distance:   Left Eye Distance:   Bilateral Distance:    Right Eye Near:   Left Eye Near:    Bilateral Near:  Physical Exam Vitals signs and nursing note reviewed.  Constitutional:      General: She is not in acute distress.    Appearance: She is well-developed.  HENT:     Head: Normocephalic and atraumatic.     Right Ear: Tympanic membrane normal.     Left Ear: Tympanic membrane normal.     Nose: Congestion present.     Mouth/Throat:     Mouth: Mucous membranes are moist.     Pharynx: No oropharyngeal exudate or posterior oropharyngeal erythema.  Eyes:     Conjunctiva/sclera: Conjunctivae normal.  Neck:     Musculoskeletal: Neck supple.  Cardiovascular:     Rate and Rhythm: Normal rate and regular rhythm.  Pulmonary:     Effort: Pulmonary effort is normal. No respiratory distress.     Breath sounds: Normal breath sounds.  Abdominal:     Palpations: Abdomen is soft.     Tenderness: There is no abdominal tenderness.  Lymphadenopathy:     Cervical: No cervical adenopathy.  Skin:    General: Skin is warm and dry.     Findings: No rash.  Neurological:     Mental Status: She is alert and oriented to person, place, and time.      UC Treatments / Results  Labs (all labs ordered are listed, but only abnormal results are displayed) Labs Reviewed - No data to display  EKG   Radiology No results found.  Procedures Procedures (including critical care time)  Medications Ordered in UC Medications - No data to display  Initial Impression / Assessment and Plan / UC Course  I have reviewed the triage vital signs and the nursing notes.  Pertinent labs & imaging results that were available during my care of the patient were reviewed by me and considered in my medical decision making (see chart for details).   Suspect COVID.  Sinus pressure.  COVID test ordered.  Treating sinus pressure with Mucinex.  Discussed with patient that she should return here  or follow-up with her primary care provider if her symptoms worsen.  Discussed that she should self quarantine until COVID test results back negative.     Final Clinical Impressions(s) / UC Diagnoses   Final diagnoses:  None   Discharge Instructions   None    ED Prescriptions    None     Controlled Substance Prescriptions [place] Controlled Substance Registry consulted? Not Applicable   Mickie Bailate, Kylan Veach H, NP 08/18/18 1133

## 2018-08-19 ENCOUNTER — Other Ambulatory Visit: Payer: Medicaid Other

## 2018-08-19 DIAGNOSIS — Z20822 Contact with and (suspected) exposure to covid-19: Secondary | ICD-10-CM

## 2018-08-22 LAB — NOVEL CORONAVIRUS, NAA: SARS-CoV-2, NAA: NOT DETECTED

## 2018-10-20 ENCOUNTER — Ambulatory Visit: Payer: Medicaid Other | Admitting: Advanced Practice Midwife

## 2018-11-05 ENCOUNTER — Ambulatory Visit: Payer: Medicaid Other | Admitting: Women's Health

## 2018-11-05 ENCOUNTER — Other Ambulatory Visit (HOSPITAL_COMMUNITY)
Admission: RE | Admit: 2018-11-05 | Discharge: 2018-11-05 | Disposition: A | Discharge status: Home / Self Care | Payer: Medicaid Other | Source: Ambulatory Visit | Attending: Obstetrics | Admitting: Obstetrics

## 2018-11-05 ENCOUNTER — Encounter: Payer: Self-pay | Admitting: Women's Health

## 2018-11-05 ENCOUNTER — Other Ambulatory Visit: Payer: Self-pay

## 2018-11-05 VITALS — BP 121/86 | HR 103 | Wt 272.0 lb

## 2018-11-05 DIAGNOSIS — Z01419 Encounter for gynecological examination (general) (routine) without abnormal findings: Secondary | ICD-10-CM | POA: Insufficient documentation

## 2018-11-05 DIAGNOSIS — Z3169 Encounter for other general counseling and advice on procreation: Secondary | ICD-10-CM

## 2018-11-05 DIAGNOSIS — Z0000 Encounter for general adult medical examination without abnormal findings: Secondary | ICD-10-CM

## 2018-11-05 DIAGNOSIS — L292 Pruritus vulvae: Secondary | ICD-10-CM

## 2018-11-05 DIAGNOSIS — Z113 Encounter for screening for infections with a predominantly sexual mode of transmission: Secondary | ICD-10-CM | POA: Insufficient documentation

## 2018-11-05 DIAGNOSIS — N926 Irregular menstruation, unspecified: Secondary | ICD-10-CM

## 2018-11-05 MED ORDER — NYSTATIN-TRIAMCINOLONE 100000-0.1 UNIT/GM-% EX OINT: 1.0000 "application " | TOPICAL_OINTMENT | Freq: Two times a day (BID) | CUTANEOUS | 0 refills | Status: DC

## 2018-11-05 NOTE — Progress Notes (Signed)
GYNECOLOGY ANNUAL PREVENTATIVE CARE ENCOUNTER NOTE  History:     Joanne Waller is a 33 y.o. G50P1011 female here for a routine annual gynecologic exam. Pt is requesting STD testing and Pap today, denies known exposures to STDs.   Current complaints:  1. Inability to achieve pregnancy: pt stopped taking OCPs two months ago and and has been attempting to conceive with her partner. Both the patient and her partner have been able to achieve successful pregnancies in the past with other partners. Pt reports intercourse between 0-3times each week. Pt reports regular menses since stopping OCPs, but reports hx of irregualr menses. Pt is currently on losartan for cHTN and metformin for DM Type II. Pt reports she would "be OK" if she did not achieve pregnancy again.  2. Patient notes vaginal discharge that has been presenting transiently for the past month. Notes intense vaginal irritation and itchiness particularly at the external vaginal area. In addition she endorses thick and cloudy vaginal discharge. She has self treated with different soaps and over the counter treatments, without success.  Denies pelvic pain, problems with intercourse or other gynecologic concerns.    Gynecologic History Patient's last menstrual period was 10/23/2018. Contraception: none, attemping conception Last Pap: 07/08/2017. Results were: normal with negative HPV Last mammogram: n/a d/t age.  Obstetric History OB History  Gravida Para Term Preterm AB Living  2 1 1   1 1   SAB TAB Ectopic Multiple Live Births  1       1    # Outcome Date GA Lbr Len/2nd Weight Sex Delivery Anes PTL Lv  2 SAB 10/06/08        DEC  1 Term 03/17/04    M Vag-Spont   LIV    Past Medical History:  Diagnosis Date  . Diabetes mellitus without complication ([facility])   . Hypertension   . Pyelonephritis   . Renal disorder    pyelonephritis  . Seizures ([facility])     Past Surgical History:  Procedure Laterality Date  . CHOLECYSTECTOMY       Current Outpatient Medications on File Prior to Visit  Medication Sig Dispense Refill  . cetirizine-pseudoephedrine (ZYRTEC-D) 5-120 MG tablet Take 1 tablet by mouth daily. 30 tablet 0  . fluticasone (FLONASE) 50 MCG/ACT nasal spray Place 2 sprays into both nostrils daily. 16 g 0  . ipratropium (ATROVENT) 0.06 % nasal spray Place 2 sprays into both nostrils 4 (four) times daily. 15 mL 0  . losartan (COZAAR) 25 MG tablet   5  . metFORMIN (GLUCOPHAGE) 500 MG tablet Take by mouth 2 (two) times daily with a meal.    . ibuprofen (ADVIL,MOTRIN) 800 MG tablet Take 1 tablet (800 mg total) by mouth every 8 (eight) hours as needed. (Patient not taking: Reported on 04/10/2018) 30 tablet 5  . LOSARTAN POTASSIUM PO Take by mouth.     No current facility-administered medications on file prior to visit.     Allergies  Allergen Reactions  . Amoxicillin Other (See Comments)    Pt stated, "I get a severe blinding headache"  . Penicillins Other (See Comments)    Sharp pain in temple   . Latex Itching, Swelling and Rash    Social History:  reports that she has been smoking cigarettes. She has a 3.50 pack-year smoking history. She has never used smokeless tobacco. She reports current alcohol use. She reports that she does not use drugs.  Family History  Problem Relation Age of Onset  .  Kidney disease Mother   . Hypertension Mother   . Sickle cell anemia Father   . Hyperlipidemia Maternal Grandmother     The following portions of the patient's history were reviewed and updated as appropriate: allergies, current medications, past family history, past medical history, past social history, past surgical history and problem list.  Review of Systems Pertinent items noted in HPI and remainder of comprehensive ROS otherwise negative.  Physical Exam:  BP 121/86   Pulse (!) 103   Wt 272 lb (123.4 kg)   LMP 10/23/2018   BMI 38.48 kg/m  CONSTITUTIONAL: Well-developed, well-nourished female in no acute  distress.  HENT:  Normocephalic, atraumatic, External right and left ear normal. Oropharynx is clear and moist EYES: Conjunctivae and EOM are normal. Pupils are equal, round, and reactive to light. No scleral icterus.  NECK: Normal range of motion, supple, no masses.  Normal thyroid.  SKIN: Skin is warm and dry. No rash noted. Not diaphoretic. No erythema. No pallor. MUSCULOSKELETAL: Normal range of motion. No tenderness.  No cyanosis, clubbing, or edema.  2+ distal pulses. NEUROLOGIC: Alert and oriented to person, place, and time. Normal reflexes, muscle tone coordination. No cranial nerve deficit noted. PSYCHIATRIC: Normal mood and affect. Normal behavior. Normal judgment and thought content. CARDIOVASCULAR: Normal heart rate noted, regular rhythm RESPIRATORY: Clear to auscultation bilaterally. Effort and breath sounds normal, no problems with respiration noted. BREASTS: Symmetric in size. No masses, skin changes, nipple drainage, or lymphadenopathy. ABDOMEN: Soft, normal bowel sounds, no distention noted.  No tenderness, rebound or guarding.  PELVIC: External genitalia with areas of hypopigmentation corresponding to areas of intense itching as stated by patient; normal appearing vaginal mucosa and cervix.  No abnormal discharge noted.  Pap smear obtained.  Normal uterine size, no other palpable masses, no uterine or adnexal tenderness.   Assessment and Plan:      1. Encounter for well woman exam with routine gynecological exam - Cytology - PAP( [place])  2. Screening examination for sexually transmitted disease - Cervicovaginal ancillary only( [place]) - HIV Antibody (routine testing w rflx) - Hepatitis B surface antigen - RPR - Hepatitis C antibody  3. Irregular menses - Thyroid Panel With TSH  4. Vulvar itching - nystatin-triamcinolone ointment (MYCOLOG); Apply 1 application topically 2 (two) times daily.  Dispense: 30 g; Refill: 0  5. Encounter for preconception  consultation -discussed that losartan is not a medication to be used in pregnancy, advised pt to schedule appt ASAP with prescriber to change to something able to be used during pregnancy; pt also advised to discuss use of metformin - discussed weight loss, improving diet and exercise prior to conception - advised pt to start PNV -discussed infertility vs. Normal expectations on pregnancy -discussed timed intercourse using ovulation detection kits, pt to call if no positive ovulation detected between menses  Will follow up results of pap smear and manage accordingly. Routine preventative health maintenance measures emphasized. Please refer to After Visit Summary for other counseling recommendations.    I confirm that I have verified the information documented in the medical student's note and that I have also personally reperformed the history, physical exam and all medical decision making activities of this service and have verified that all service and findings are accurately documented in this student's note.   Clarisa Fling, NP 11/05/2018 6:40 PM

## 2018-11-05 NOTE — Patient Instructions (Signed)
Vaginal Yeast infection, Adult  Vaginal yeast infection is a condition that causes vaginal discharge as well as soreness, swelling, and redness (inflammation) of the vagina. This is a common condition. Some women get this infection frequently. What are the causes? This condition is caused by a change in the normal balance of the yeast (candida) and bacteria that live in the vagina. This change causes an overgrowth of yeast, which causes the inflammation. What increases the risk? The condition is more likely to develop in women who:  Take antibiotic medicines.  Have diabetes.  Take birth control pills.  Are pregnant.  Douche often.  Have a weak body defense system (immune system).  Have been taking steroid medicines for a long time.  Frequently wear tight clothing. What are the signs or symptoms? Symptoms of this condition include:  White, thick, creamy vaginal discharge.  Swelling, itching, redness, and irritation of the vagina. The lips of the vagina (vulva) may be affected as well.  Pain or a burning feeling while urinating.  Pain during sex. How is this diagnosed? This condition is diagnosed based on:  Your medical history.  A physical exam.  A pelvic exam. Your health care provider will examine a sample of your vaginal discharge under a microscope. Your health care provider may send this sample for testing to confirm the diagnosis. How is this treated? This condition is treated with medicine. Medicines may be over-the-counter or prescription. You may be told to use one or more of the following:  Medicine that is taken by mouth (orally).  Medicine that is applied as a cream (topically).  Medicine that is inserted directly into the vagina (suppository). Follow these instructions at home:  Lifestyle  Do not have sex until your health care provider approves. Tell your sex partner that you have a yeast infection. That person should go to his or her health care  provider and ask if they should also be treated.  Do not wear tight clothes, such as pantyhose or tight pants.  Wear breathable cotton underwear. General instructions  Take or apply over-the-counter and prescription medicines only as told by your health care provider.  Eat more yogurt. This may help to keep your yeast infection from returning.  Do not use tampons until your health care provider approves.  Try taking a sitz bath to help with discomfort. This is a warm water bath that is taken while you are sitting down. The water should only come up to your hips and should cover your buttocks. Do this 3-4 times per day or as told by your health care provider.  Do not douche.  If you have diabetes, keep your blood sugar levels under control.  Keep all follow-up visits as told by your health care provider. This is important. Contact a health care provider if:  You have a fever.  Your symptoms go away and then return.  Your symptoms do not get better with treatment.  Your symptoms get worse.  You have new symptoms.  You develop blisters in or around your vagina.  You have blood coming from your vagina and it is not your menstrual period.  You develop pain in your abdomen. Summary  Vaginal yeast infection is a condition that causes discharge as well as soreness, swelling, and redness (inflammation) of the vagina.  This condition is treated with medicine. Medicines may be over-the-counter or prescription.  Take or apply over-the-counter and prescription medicines only as told by your health care provider.  Do not douche.   Do not have sex or use tampons until your health care provider approves.  Contact a health care provider if your symptoms do not get better with treatment or your symptoms go away and then return. This information is not intended to replace advice given to you by your health care provider. Make sure you discuss any questions you have with your health care  provider. Document Released: 11/01/2004 Document Revised: 06/10/2017 Document Reviewed: 06/10/2017 Elsevier Patient Education  2020 [employer]. Preparing for Pregnancy If you are considering becoming pregnant, make an appointment to see your regular health care provider to learn how to prepare for a safe and healthy pregnancy (preconception care). During a preconception care visit, your health care provider will:  Do a complete physical exam, including a Pap test.  Take a complete medical history.  Give you information, answer your questions, and help you resolve problems. Preconception checklist Medical history  Tell your health care provider about any current or past medical conditions. Your pregnancy or your ability to become pregnant may be affected by chronic conditions, such as diabetes, chronic hypertension, and thyroid problems.  Include your family's medical history as well as your partner's medical history.  Tell your health care provider about any history of STIs (sexually transmitted infections).These can affect your pregnancy. In some cases, they can be passed to your baby. Discuss any concerns that you have about STIs.  If indicated, discuss the benefits of genetic testing. This testing will show whether there are any genetic conditions that may be passed from you or your partner to your baby.  Tell your health care provider about: ? Any problems you have had with conception or pregnancy. ? Any medicines you take. These include vitamins, herbal supplements, and over-the-counter medicines. ? Your history of immunizations. Discuss any vaccinations that you may need. Diet  Ask your health care provider what to include in a healthy diet that has a balance of nutrients. This is especially important when you are pregnant or preparing to become pregnant.  Ask your health care provider to help you reach a healthy weight before pregnancy. ? If you are overweight, you may be at  higher risk for certain complications, such as high blood pressure, diabetes, and preterm birth. ? If you are underweight, you are more likely to have a baby who has a low birth weight. Lifestyle, work, and home  Let your health care provider know: ? About any lifestyle habits that you have, such as alcohol use, drug use, or smoking. ? About recreational activities that may put you at risk during pregnancy, such as downhill skiing and certain exercise programs. ? Tell your health care provider about any international travel, especially any travel to places with an active [place] virus outbreak. ? About harmful substances that you may be exposed to at work or at home. These include chemicals, pesticides, radiation, or even litter boxes. ? If you do not feel safe at home. Mental health  Tell your health care provider about: ? Any history of mental health conditions, including feelings of depression, sadness, or anxiety. ? Any medicines that you take for a mental health condition. These include herbs and supplements. Home instructions to prepare for pregnancy Lifestyle   Eat a balanced diet. This includes fresh fruits and vegetables, whole grains, lean meats, low-fat dairy products, healthy fats, and foods that are high in fiber. Ask to meet with a nutritionist or registered dietitian for assistance with meal planning and goals.  Get regular exercise. Try  to be active for at least 30 minutes a day on most days of the week. Ask your health care provider which activities are safe during pregnancy.  Do not use any products that contain nicotine or tobacco, such as cigarettes and e-cigarettes. If you need help quitting, ask your health care provider.  Do not drink alcohol.  Do not take illegal drugs.  Maintain a healthy weight. Ask your health care provider what weight range is right for you. General instructions  Keep an accurate record of your menstrual periods. This makes it easier for your  health care provider to determine your baby's due date.  Begin taking prenatal vitamins and folic acid supplements daily as directed by your health care provider.  Manage any chronic conditions, such as high blood pressure and diabetes, as told by your health care provider. This is important. How do I know that I am pregnant? You may be pregnant if you have been sexually active and you miss your period. Symptoms of early pregnancy include:  Mild cramping.  Very light vaginal bleeding (spotting).  Feeling unusually tired.  Nausea and vomiting (morning sickness). If you have any of these symptoms and you suspect that you might be pregnant, you can take a home pregnancy test. These tests check for a hormone in your urine (human chorionic gonadotropin, or hCG). A woman's body begins to make this hormone during early pregnancy. These tests are very accurate. Wait until at least the first day after you miss your period to take one. If the test shows that you are pregnant (you get a positive result), call your health care provider to make an appointment for prenatal care. What should I do if I become pregnant?      Make an appointment with your health care provider as soon as you suspect you are pregnant.  Do not use any products that contain nicotine, such as cigarettes, chewing tobacco, and e-cigarettes. If you need help quitting, ask your health care provider.  Do not drink alcoholic beverages. Alcohol is related to a number of birth defects.  Avoid toxic odors and chemicals.  You may continue to have sexual intercourse if it does not cause pain or other problems, such as vaginal bleeding. This information is not intended to replace advice given to you by your health care provider. Make sure you discuss any questions you have with your health care provider. Document Released: 01/05/2008 Document Revised: 01/24/2017 Document Reviewed: 08/14/2015 Elsevier Patient Education  2020 [employer].

## 2018-11-06 LAB — CERVICOVAGINAL ANCILLARY ONLY
Bacterial Vaginitis (gardnerella): NEGATIVE
Candida Glabrata: POSITIVE — AB
Candida Vaginitis: POSITIVE — AB
Chlamydia: NEGATIVE
Molecular Disclaimer: NEGATIVE
Molecular Disclaimer: NEGATIVE
Molecular Disclaimer: NEGATIVE
Molecular Disclaimer: NEGATIVE
Molecular Disclaimer: NORMAL
Molecular Disclaimer: NORMAL
Neisseria Gonorrhea: NEGATIVE
Trichomonas: NEGATIVE

## 2018-11-07 ENCOUNTER — Telehealth: Payer: Self-pay | Admitting: Women's Health

## 2018-11-07 DIAGNOSIS — B3731 Acute candidiasis of vulva and vagina: Secondary | ICD-10-CM

## 2018-11-07 DIAGNOSIS — B373 Candidiasis of vulva and vagina: Secondary | ICD-10-CM

## 2018-11-07 LAB — THYROID PANEL WITH TSH
Free Thyroxine Index: 2.7 (ref 1.2–4.9)
T3 Uptake Ratio: 27 % (ref 24–39)
T4, Total: 10 ug/dL (ref 4.5–12.0)
TSH: 0.476 u[IU]/mL (ref 0.450–4.500)

## 2018-11-07 LAB — RPR: RPR Ser Ql: NONREACTIVE

## 2018-11-07 LAB — HIV ANTIBODY (ROUTINE TESTING W REFLEX): HIV Screen 4th Generation wRfx: NONREACTIVE

## 2018-11-07 LAB — HEPATITIS C ANTIBODY: Hep C Virus Ab: 0.1 s/co ratio (ref 0.0–0.9)

## 2018-11-07 LAB — HEPATITIS B SURFACE ANTIGEN: Hepatitis B Surface Ag: NEGATIVE

## 2018-11-07 MED ORDER — TERCONAZOLE 0.8 % VA CREA: 1.0000 | TOPICAL_CREAM | Freq: Every day | VAGINAL | 0 refills | Status: DC

## 2018-11-07 NOTE — Telephone Encounter (Signed)
Called patient and pt identified by two identifiers. Relayed test results to patient. HIV/RPR/HepB/HepC/GC/CT/trich/BV negative. Pt informed she tested positive for candida glabrata and would need a vaginal cream in addition to the external ointment provided at her office visit. Pt confirms allergies to PCNs/latex only and denies using any additional vaginal creams at this time, aside from the prescription given for the external ointment. Will send RX to pharmacy for nystatin. Pt aware Pap results still pending. Questions answered to patient satisfaction and patient verbalizes understanding.  Unable to find nystatin vaginal cream/capsules in database. Called and spoke with [facility] pharmacy regarding options, but they were unable to assist that that time due to low staffing in pharmacy. Waited for a return phone call for an hour, but did not receive one.  Per UpToDate, candida glbrata unlikely to be cause of symptoms, so will send terconazole cream for patient. If no resolution of symptoms, will try alternative treatment.  Clarisa Fling, NP  9:41 AM 11/07/2018

## 2018-11-14 LAB — CYTOLOGY - PAP
Diagnosis: NEGATIVE
High risk HPV: NEGATIVE

## 2018-11-21 ENCOUNTER — Ambulatory Visit (HOSPITAL_COMMUNITY)
Admission: EM | Admit: 2018-11-21 | Discharge: 2018-11-21 | Disposition: A | Discharge status: Home / Self Care | Payer: Medicaid Other | Attending: Family Medicine | Admitting: Family Medicine

## 2018-11-21 ENCOUNTER — Encounter (HOSPITAL_COMMUNITY): Payer: Self-pay

## 2018-11-21 DIAGNOSIS — Z720 Tobacco use: Secondary | ICD-10-CM | POA: Diagnosis not present

## 2018-11-21 DIAGNOSIS — J069 Acute upper respiratory infection, unspecified: Secondary | ICD-10-CM

## 2018-11-21 DIAGNOSIS — Z20828 Contact with and (suspected) exposure to other viral communicable diseases: Secondary | ICD-10-CM | POA: Diagnosis present

## 2018-11-21 DIAGNOSIS — I10 Essential (primary) hypertension: Secondary | ICD-10-CM | POA: Diagnosis not present

## 2018-11-21 DIAGNOSIS — Z20822 Contact with and (suspected) exposure to covid-19: Secondary | ICD-10-CM

## 2018-11-21 MED ORDER — FLUTICASONE PROPIONATE 50 MCG/ACT NA SUSP: 2.0000 | Freq: Every day | NASAL | 0 refills | Status: DC

## 2018-11-21 NOTE — Discharge Instructions (Signed)
Push fluids.  Rest Tylenol for pain and fever Use Flonase twice a day for the first week then once a day until symptoms, May use over-the-counter cough and cold medicines Covid testing is done.  It would take a couple of days.  You will be called only if your test is positive.    Person Under Monitoring Name: Joanne Waller  Location: [address] [place] [address]   Infection Prevention Recommendations for Individuals Confirmed to have, or Being Evaluated for, 2019 Novel Coronavirus (COVID-19) Infection Who Receive Care at Home  Individuals who are confirmed to have, or are being evaluated for, COVID-19 should follow the prevention steps below until a healthcare provider or local or state health department says they can return to normal activities.  Stay home except to get medical care You should restrict activities outside your home, except for getting medical care. Do not go to work, school, or public areas, and do not use public transportation or taxis.  Call ahead before visiting your doctor Before your medical appointment, call the healthcare provider and tell them that you have, or are being evaluated for, COVID-19 infection. This will help the healthcare providers office take steps to keep other people from getting infected. Ask your healthcare provider to call the local or state health department.  Monitor your symptoms Seek prompt medical attention if your illness is worsening (e.g., difficulty breathing). Before going to your medical appointment, call the healthcare provider and tell them that you have, or are being evaluated for, COVID-19 infection. Ask your healthcare provider to call the local or state health department.  Wear a facemask You should wear a facemask that covers your nose and mouth when you are in the same room with other people and when you visit a healthcare provider. People who live with or visit you should also wear a facemask while they  are in the same room with you.  Separate yourself from other people in your home As much as possible, you should stay in a different room from other people in your home. Also, you should use a separate bathroom, if available.  Avoid sharing household items You should not share dishes, drinking glasses, cups, eating utensils, towels, bedding, or other items with other people in your home. After using these items, you should wash them thoroughly with soap and water.  Cover your coughs and sneezes Cover your mouth and nose with a tissue when you cough or sneeze, or you can cough or sneeze into your sleeve. Throw used tissues in a lined trash can, and immediately wash your hands with soap and water for at least 20 seconds or use an alcohol-based hand rub.  Wash your [employer] your hands often and thoroughly with soap and water for at least 20 seconds. You can use an alcohol-based hand sanitizer if soap and water are not available and if your hands are not visibly dirty. Avoid touching your eyes, nose, and mouth with unwashed hands.   Prevention Steps for Caregivers and Household Members of Individuals Confirmed to have, or Being Evaluated for, COVID-19 Infection Being Cared for in the Home  If you live with, or provide care at home for, a person confirmed to have, or being evaluated for, COVID-19 infection please follow these guidelines to prevent infection:  Follow healthcare providers instructions Make sure that you understand and can help the patient follow any healthcare provider instructions for all care.  Provide for the patients basic needs You should help  the patient with basic needs in the home and provide support for getting groceries, prescriptions, and other personal needs.  Monitor the patients symptoms If they are getting sicker, call his or her medical provider and tell them that the patient has, or is being evaluated for, COVID-19 infection. This will help the  healthcare providers office take steps to keep other people from getting infected. Ask the healthcare provider to call the local or state health department.  Limit the number of people who have contact with the patient If possible, have only one caregiver for the patient. Other household members should stay in another home or place of residence. If this is not possible, they should stay in another room, or be separated from the patient as much as possible. Use a separate bathroom, if available. Restrict visitors who do not have an essential need to be in the home.  Keep older adults, very young children, and other sick people away from the patient Keep older adults, very young children, and those who have compromised immune systems or chronic health conditions away from the patient. This includes people with chronic heart, lung, or kidney conditions, diabetes, and cancer.  Ensure good ventilation Make sure that shared spaces in the home have good air flow, such as from an air conditioner or an opened window, weather permitting.  Wash your hands often Wash your hands often and thoroughly with soap and water for at least 20 seconds. You can use an alcohol based hand sanitizer if soap and water are not available and if your hands are not visibly dirty. Avoid touching your eyes, nose, and mouth with unwashed hands. Use disposable paper towels to dry your hands. If not available, use dedicated cloth towels and replace them when they become wet.  Wear a facemask and gloves Wear a disposable facemask at all times in the room and gloves when you touch or have contact with the patients blood, body fluids, and/or secretions or excretions, such as sweat, saliva, sputum, nasal mucus, vomit, urine, or feces.  Ensure the mask fits over your nose and mouth tightly, and do not touch it during use. Throw out disposable facemasks and gloves after using them. Do not reuse. Wash your hands immediately after  removing your facemask and gloves. If your personal clothing becomes contaminated, carefully remove clothing and launder. Wash your hands after handling contaminated clothing. Place all used disposable facemasks, gloves, and other waste in a lined container before disposing them with other household waste. Remove gloves and wash your hands immediately after handling these items.  Do not share dishes, glasses, or other household items with the patient Avoid sharing household items. You should not share dishes, drinking glasses, cups, eating utensils, towels, bedding, or other items with a patient who is confirmed to have, or being evaluated for, COVID-19 infection. After the person uses these items, you should wash them thoroughly with soap and water.  Wash laundry thoroughly Immediately remove and wash clothes or bedding that have blood, body fluids, and/or secretions or excretions, such as sweat, saliva, sputum, nasal mucus, vomit, urine, or feces, on them. Wear gloves when handling laundry from the patient. Read and follow directions on labels of laundry or clothing items and detergent. In general, wash and dry with the warmest temperatures recommended on the label.  Clean all areas the individual has used often Clean all touchable surfaces, such as counters, tabletops, doorknobs, bathroom fixtures, toilets, phones, keyboards, tablets, and bedside tables, every day. Also, clean any surfaces  that may have blood, body fluids, and/or secretions or excretions on them. Wear gloves when cleaning surfaces the patient has come in contact with. Use a diluted bleach solution (e.g., dilute bleach with 1 part bleach and 10 parts water) or a household disinfectant with a label that says EPA-registered for coronaviruses. To make a bleach solution at home, add 1 tablespoon of bleach to 1 quart (4 cups) of water. For a larger supply, add  cup of bleach to 1 gallon (16 cups) of water. Read labels of cleaning  products and follow recommendations provided on product labels. Labels contain instructions for safe and effective use of the cleaning product including precautions you should take when applying the product, such as wearing gloves or eye protection and making sure you have good ventilation during use of the product. Remove gloves and wash hands immediately after cleaning.  Monitor yourself for signs and symptoms of illness Caregivers and household members are considered close contacts, should monitor their health, and will be asked to limit movement outside of the home to the extent possible. Follow the monitoring steps for close contacts listed on the symptom monitoring form.   ? If you have additional questions, contact your local health department or call the epidemiologist on call at [phone] (available 24/7). ? This guidance is subject to change. For the most up-to-date guidance from [facility], please refer to their website: [email or web address].

## 2018-11-21 NOTE — ED Triage Notes (Signed)
Pt states she is having sinus pressure x 2 days, headache, sneezing and cough x 1 day.

## 2018-11-21 NOTE — ED Provider Notes (Signed)
[facility]    CSN: [id number] Arrival date & time: 11/21/18  1111      History   Chief Complaint Chief Complaint  Patient presents with   Sinus Problem   Headache   Cough    HPI Joanne Waller is a 33 y.o. female.   HPI  Patient is here for an upper respiratory infection.  She has headache, sinus pressure, sinus congestion, postnasal drip, and cough since yesterday.  Some sneezing.  Some watering of the eyes.  She states it feels like allergy symptoms.  It may be related to a respiratory infection.  No fever chills.  No body aches.  No change in sense of taste or smell.  She does not think she has been around anyone with coronavirus.  She works from home.  Past Medical History:  Diagnosis Date   Diabetes mellitus without complication (HCC)    Hypertension    Pyelonephritis    Renal disorder    pyelonephritis   Seizures [facility])     Patient Active Problem List   Diagnosis Date Noted   Irregular menses 10/05/2013   Irregular menstrual cycle 05/22/2012    Past Surgical History:  Procedure Laterality Date   CHOLECYSTECTOMY      OB History    Gravida  2   Para  1   Term  1   Preterm      AB  1   Living  1     SAB  1   TAB      Ectopic      Multiple      Live Births  1            Home Medications    Prior to Admission medications   Medication Sig Start Date End Date Taking? Authorizing Provider  fluticasone (FLONASE) 50 MCG/ACT nasal spray Place 2 sprays into both nostrils daily. 11/21/18   Eustace Moore, MD  losartan (COZAAR) 25 MG tablet  06/03/17   [provider]  LOSARTAN POTASSIUM PO Take by mouth.    [provider]  metFORMIN (GLUCOPHAGE) 500 MG tablet Take by mouth 2 (two) times daily with a meal.    [provider]  ipratropium (ATROVENT) 0.06 % nasal spray Place 2 sprays into both nostrils 4 (four) times daily. 04/20/18 11/21/18  Belinda Fisher, PA-C    Family History Family  History  Problem Relation Age of Onset   Kidney disease Mother    Hypertension Mother    Sickle cell anemia Father    Hyperlipidemia Maternal Grandmother     Social History Social History   Tobacco Use   Smoking status: Current Every Day Smoker    Packs/day: 0.50    Years: 7.00    Pack years: 3.50    Types: Cigarettes   Smokeless tobacco: Never Used  Substance Use Topics   Alcohol use: Yes    Comment: occ   Drug use: No     Allergies   Amoxicillin, Penicillins, and Latex   Review of Systems Review of Systems  Constitutional: Negative for chills, fatigue and fever.  HENT: Positive for congestion, postnasal drip, rhinorrhea, sinus pressure, sinus pain and sneezing. Negative for ear pain and sore throat.   Eyes: Negative for pain and visual disturbance.  Respiratory: Positive for cough. Negative for shortness of breath.   Cardiovascular: Negative for chest pain and palpitations.  Gastrointestinal: Negative for abdominal pain and vomiting.  Genitourinary: Negative for dysuria and hematuria.  Musculoskeletal: Negative for arthralgias and back pain.  Skin: Negative for color change and rash.  Neurological: Positive for headaches. Negative for seizures and syncope.  All other systems reviewed and are negative.    Physical Exam Triage Vital Signs ED Triage Vitals  Enc Vitals Group     BP 11/21/18 1216 (!) 132/92     Pulse Rate 11/21/18 1216 77     Resp 11/21/18 1216 16     Temp 11/21/18 1216 97.6 F (36.4 C)     Temp Source 11/21/18 1216 Temporal     SpO2 11/21/18 1216 95 %     Weight --      Height --      Head Circumference --      Peak Flow --      Pain Score 11/21/18 1214 4     Pain Loc --      Pain Edu? --      Excl. in GC? --    No data found.  Updated Vital Signs BP (!) 132/92 (BP Location: Right Arm)    Pulse 77    Temp 97.6 F (36.4 C) (Temporal)    Resp 16    LMP 10/23/2018    SpO2 95%      Physical Exam Constitutional:      General:  She is not in acute distress.    Appearance: She is well-developed. She is obese. She is not ill-appearing.  HENT:     Head: Normocephalic and atraumatic.     Right Ear: Tympanic membrane, ear canal and external ear normal.     Left Ear: Tympanic membrane, ear canal and external ear normal.     Nose: Rhinorrhea present.     Mouth/Throat:     Pharynx: Posterior oropharyngeal erythema present.  Eyes:     Conjunctiva/sclera: Conjunctivae normal.     Pupils: Pupils are equal, round, and reactive to light.  Neck:     Musculoskeletal: Normal range of motion.  Cardiovascular:     Rate and Rhythm: Normal rate and regular rhythm.  Pulmonary:     Effort: Pulmonary effort is normal. No respiratory distress.     Breath sounds: Normal breath sounds. No rhonchi.  Abdominal:     General: There is no distension.     Palpations: Abdomen is soft.  Musculoskeletal: Normal range of motion.  Lymphadenopathy:     Cervical: Cervical adenopathy present.  Skin:    General: Skin is warm and dry.  Neurological:     Mental Status: She is alert.  Psychiatric:        Mood and Affect: Mood normal.        Behavior: Behavior normal.      UC Treatments / Results  Labs (all labs ordered are listed, but only abnormal results are displayed) Labs Reviewed  NOVEL CORONAVIRUS, NAA (HOSP ORDER, SEND-OUT TO REF LAB; TAT 18-24 HRS)    EKG   Radiology No results found.  Procedures Procedures (including critical care time)  Medications Ordered in UC Medications - No data to display  Initial Impression / Assessment and Plan / UC Course  I have reviewed the triage vital signs and the nursing notes.  Pertinent labs & imaging results that were available during my care of the patient were reviewed by me and considered in my medical decision making (see chart for details).     Discussed possibility of coronavirus.  Discussed coronavirus management.  Otherwise use Flonase, usual allergy medicines, push  fluids.  Expect  improvement in a few days. Final Clinical Impressions(s) / UC Diagnoses   Final diagnoses:  Viral upper respiratory tract infection  Suspected COVID-19 virus infection     Discharge Instructions     Push fluids.  Rest Tylenol for pain and fever Use Flonase twice a day for the first week then once a day until symptoms, May use over-the-counter cough and cold medicines Covid testing is done.  It would take a couple of days.  You will be called only if your test is positive.    Person Under Monitoring Name: Rex KrasJasmin J Callanan  [address] [place] [address]   Infection Prevention Recommendations for Individuals Confirmed to have, or Being Evaluated for, 2019 Novel Coronavirus (COVID-19) Infection Who Receive Care at Home  Individuals who are confirmed to have, or are being evaluated for, COVID-19 should follow the prevention steps below until a healthcare provider or local or state health department says they can return to normal activities.  Stay home except to get medical care You should restrict activities outside your home, except for getting medical care. Do not go to work, school, or public areas, and do not use public transportation or taxis.  Call ahead before visiting your doctor Before your medical appointment, call the healthcare provider and tell them that you have, or are being evaluated for, COVID-19 infection. This will help the healthcare providers office take steps to keep other people from getting infected. Ask your healthcare provider to call the local or state health department.  Monitor your symptoms Seek prompt medical attention if your illness is worsening (e.g., difficulty breathing). Before going to your medical appointment, call the healthcare provider and tell them that you have, or are being evaluated for, COVID-19 infection. Ask your healthcare provider to call the local or state health department.  Wear a  facemask You should wear a facemask that covers your nose and mouth when you are in the same room with other people and when you visit a healthcare provider. People who live with or visit you should also wear a facemask while they are in the same room with you.  Separate yourself from other people in your home As much as possible, you should stay in a different room from other people in your home. Also, you should use a separate bathroom, if available.  Avoid sharing household items You should not share dishes, drinking glasses, cups, eating utensils, towels, bedding, or other items with other people in your home. After using these items, you should wash them thoroughly with soap and water.  Cover your coughs and sneezes Cover your mouth and nose with a tissue when you cough or sneeze, or you can cough or sneeze into your sleeve. Throw used tissues in a lined trash can, and immediately wash your hands with soap and water for at least 20 seconds or use an alcohol-based hand rub.  Wash your [employer] Corporationhands Wash your hands often and thoroughly with soap and water for at least 20 seconds. You can use an alcohol-based hand sanitizer if soap and water are not available and if your hands are not visibly dirty. Avoid touching your eyes, nose, and mouth with unwashed hands.   Prevention Steps for Caregivers and Household Members of Individuals Confirmed to have, or Being Evaluated for, COVID-19 Infection Being Cared for in the Home  If you live with, or provide care at home for, a person confirmed to have, or being evaluated for, COVID-19 infection please follow these guidelines  to prevent infection:  Follow healthcare providers instructions Make sure that you understand and can help the patient follow any healthcare provider instructions for all care.  Provide for the patients basic needs You should help the patient with basic needs in the home and provide support for getting groceries,  prescriptions, and other personal needs.  Monitor the patients symptoms If they are getting sicker, call his or her medical provider and tell them that the patient has, or is being evaluated for, COVID-19 infection. This will help the healthcare providers office take steps to keep other people from getting infected. Ask the healthcare provider to call the local or state health department.  Limit the number of people who have contact with the patient  If possible, have only one caregiver for the patient.  Other household members should stay in another home or place of residence. If this is not possible, they should stay  in another room, or be separated from the patient as much as possible. Use a separate bathroom, if available.  Restrict visitors who do not have an essential need to be in the home.  Keep older adults, very young children, and other sick people away from the patient Keep older adults, very young children, and those who have compromised immune systems or chronic health conditions away from the patient. This includes people with chronic heart, lung, or kidney conditions, diabetes, and cancer.  Ensure good ventilation Make sure that shared spaces in the home have good air flow, such as from an air conditioner or an opened window, weather permitting.  Wash your hands often  Wash your hands often and thoroughly with soap and water for at least 20 seconds. You can use an alcohol based hand sanitizer if soap and water are not available and if your hands are not visibly dirty.  Avoid touching your eyes, nose, and mouth with unwashed hands.  Use disposable paper towels to dry your hands. If not available, use dedicated cloth towels and replace them when they become wet.  Wear a facemask and gloves  Wear a disposable facemask at all times in the room and gloves when you touch or have contact with the patients blood, body fluids, and/or secretions or excretions, such as  sweat, saliva, sputum, nasal mucus, vomit, urine, or feces.  Ensure the mask fits over your nose and mouth tightly, and do not touch it during use.  Throw out disposable facemasks and gloves after using them. Do not reuse.  Wash your hands immediately after removing your facemask and gloves.  If your personal clothing becomes contaminated, carefully remove clothing and launder. Wash your hands after handling contaminated clothing.  Place all used disposable facemasks, gloves, and other waste in a lined container before disposing them with other household waste.  Remove gloves and wash your hands immediately after handling these items.  Do not share dishes, glasses, or other household items with the patient  Avoid sharing household items. You should not share dishes, drinking glasses, cups, eating utensils, towels, bedding, or other items with a patient who is confirmed to have, or being evaluated for, COVID-19 infection.  After the person uses these items, you should wash them thoroughly with soap and water.  Wash laundry thoroughly  Immediately remove and wash clothes or bedding that have blood, body fluids, and/or secretions or excretions, such as sweat, saliva, sputum, nasal mucus, vomit, urine, or feces, on them.  Wear gloves when handling laundry from the patient.  Read and follow directions on  labels of laundry or clothing items and detergent. In general, wash and dry with the warmest temperatures recommended on the label.  Clean all areas the individual has used often  Clean all touchable surfaces, such as counters, tabletops, doorknobs, bathroom fixtures, toilets, phones, keyboards, tablets, and bedside tables, every day. Also, clean any surfaces that may have blood, body fluids, and/or secretions or excretions on them.  Wear gloves when cleaning surfaces the patient has come in contact with.  Use a diluted bleach solution (e.g., dilute bleach with 1 part bleach and 10 parts  water) or a household disinfectant with a label that says EPA-registered for coronaviruses. To make a bleach solution at home, add 1 tablespoon of bleach to 1 quart (4 cups) of water. For a larger supply, add  cup of bleach to 1 gallon (16 cups) of water.  Read labels of cleaning products and follow recommendations provided on product labels. Labels contain instructions for safe and effective use of the cleaning product including precautions you should take when applying the product, such as wearing gloves or eye protection and making sure you have good ventilation during use of the product.  Remove gloves and wash hands immediately after cleaning.  Monitor yourself for signs and symptoms of illness Caregivers and household members are considered close contacts, should monitor their health, and will be asked to limit movement outside of the home to the extent possible. Follow the monitoring steps for close contacts listed on the symptom monitoring form.   ? If you have additional questions, contact your local health department or call the epidemiologist on call at [phone] (available 24/7). ? This guidance is subject to change. For the most up-to-date guidance from [facility], please refer to their website: [email or web address].   ED Prescriptions    Medication Sig Dispense Auth. Provider   fluticasone (FLONASE) 50 MCG/ACT nasal spray Place 2 sprays into both nostrils daily. 16 g Raylene Everts, MD     PDMP not reviewed this encounter.   Raylene Everts, MD 11/21/18 1323

## 2018-11-23 LAB — NOVEL CORONAVIRUS, NAA (HOSP ORDER, SEND-OUT TO REF LAB; TAT 18-24 HRS): SARS-CoV-2, NAA: NOT DETECTED

## 2019-01-28 ENCOUNTER — Ambulatory Visit: Payer: Medicaid Other | Attending: Internal Medicine

## 2019-01-28 DIAGNOSIS — Z20822 Contact with and (suspected) exposure to covid-19: Secondary | ICD-10-CM

## 2019-01-30 LAB — NOVEL CORONAVIRUS, NAA: SARS-CoV-2, NAA: NOT DETECTED

## 2019-03-03 ENCOUNTER — Ambulatory Visit: Payer: Medicaid Other | Attending: Internal Medicine

## 2019-03-03 ENCOUNTER — Other Ambulatory Visit: Payer: Self-pay

## 2019-03-03 DIAGNOSIS — Z20822 Contact with and (suspected) exposure to covid-19: Secondary | ICD-10-CM

## 2019-03-03 DIAGNOSIS — N898 Other specified noninflammatory disorders of vagina: Secondary | ICD-10-CM

## 2019-03-03 MED ORDER — FLUCONAZOLE 150 MG PO TABS: 150.0000 mg | ORAL_TABLET | Freq: Once | ORAL | 0 refills | Status: AC

## 2019-03-03 NOTE — Progress Notes (Signed)
Pt called requesting yeast Rx due to recent antibiotics.  Rx sent per protocol

## 2019-03-04 LAB — NOVEL CORONAVIRUS, NAA: SARS-CoV-2, NAA: NOT DETECTED

## 2019-04-17 ENCOUNTER — Other Ambulatory Visit (HOSPITAL_COMMUNITY)
Admission: RE | Admit: 2019-04-17 | Discharge: 2019-04-17 | Disposition: A | Discharge status: Home / Self Care | Payer: Medicaid Other | Source: Ambulatory Visit | Attending: Obstetrics | Admitting: Obstetrics

## 2019-04-17 ENCOUNTER — Other Ambulatory Visit: Payer: Self-pay

## 2019-04-17 ENCOUNTER — Ambulatory Visit (INDEPENDENT_AMBULATORY_CARE_PROVIDER_SITE_OTHER): Payer: Medicaid Other

## 2019-04-17 DIAGNOSIS — Z113 Encounter for screening for infections with a predominantly sexual mode of transmission: Secondary | ICD-10-CM | POA: Diagnosis present

## 2019-04-17 NOTE — Progress Notes (Signed)
Agree with A & P. 

## 2019-04-17 NOTE — Progress Notes (Signed)
Joanne Waller is here for STD screening. Pt reports having white discharge with itching for about 1 week. She also requested extended (lab draw) STD testing. Results pending. -EH/RMA

## 2019-04-18 LAB — RPR: RPR Ser Ql: NONREACTIVE

## 2019-04-18 LAB — HIV ANTIBODY (ROUTINE TESTING W REFLEX): HIV Screen 4th Generation wRfx: NONREACTIVE

## 2019-04-20 LAB — CERVICOVAGINAL ANCILLARY ONLY
Bacterial Vaginitis (gardnerella): NEGATIVE
Candida Glabrata: POSITIVE — AB
Candida Vaginitis: POSITIVE — AB
Chlamydia: NEGATIVE
Comment: NEGATIVE
Comment: NEGATIVE
Comment: NEGATIVE
Comment: NEGATIVE
Comment: NEGATIVE
Comment: NORMAL
Neisseria Gonorrhea: NEGATIVE
Trichomonas: NEGATIVE

## 2019-04-21 ENCOUNTER — Other Ambulatory Visit: Payer: Self-pay

## 2019-04-21 DIAGNOSIS — B373 Candidiasis of vulva and vagina: Secondary | ICD-10-CM

## 2019-04-21 DIAGNOSIS — B3731 Acute candidiasis of vulva and vagina: Secondary | ICD-10-CM

## 2019-04-21 MED ORDER — FLUCONAZOLE 150 MG PO TABS: 150.0000 mg | ORAL_TABLET | Freq: Once | ORAL | 3 refills | Status: AC

## 2019-04-21 NOTE — Progress Notes (Signed)
Rx sent as advised.  

## 2019-06-02 ENCOUNTER — Ambulatory Visit (HOSPITAL_COMMUNITY)
Admission: EM | Admit: 2019-06-02 | Discharge: 2019-06-02 | Disposition: A | Discharge status: Home / Self Care | Payer: Medicaid Other | Attending: Family Medicine | Admitting: Family Medicine

## 2019-06-02 ENCOUNTER — Ambulatory Visit (HOSPITAL_COMMUNITY): Payer: Medicaid Other

## 2019-06-02 ENCOUNTER — Encounter (HOSPITAL_COMMUNITY): Payer: Self-pay

## 2019-06-02 ENCOUNTER — Other Ambulatory Visit: Payer: Self-pay

## 2019-06-02 DIAGNOSIS — M5441 Lumbago with sciatica, right side: Secondary | ICD-10-CM

## 2019-06-02 MED ORDER — IBUPROFEN 800 MG PO TABS: 800.0000 mg | ORAL_TABLET | Freq: Three times a day (TID) | ORAL | 0 refills | Status: DC

## 2019-06-02 MED ORDER — CARISOPRODOL 350 MG PO TABS: 350.0000 mg | ORAL_TABLET | Freq: Three times a day (TID) | ORAL | 0 refills | Status: DC

## 2019-06-02 NOTE — ED Triage Notes (Signed)
Pt presents with lower back and pain in buttocks  Area since yesterday.

## 2019-06-02 NOTE — ED Notes (Signed)
Pt stated she was having unprotected sex, her cycle wasn't due until May 6th, unable to perform x-rays. SRP

## 2019-06-02 NOTE — Discharge Instructions (Signed)
Take the ibuprofen 3 x a day with food Take the soma as needed muscle relaxer This will not be refilled, may cause drowsiness Return if pain persists, we can do x ray if pain is still problematic after we determine you are not pregnant

## 2019-06-02 NOTE — ED Provider Notes (Signed)
[facility]    CSN: [id number] Arrival date & time: 06/02/19  [place]      History   Chief Complaint Chief Complaint  Patient presents with  . Back Pain    HPI Joanne Waller is a 34 y.o. female.   HPI  Patient states she had back pain since yesterday.  During the day it develops gradually.  She had no accident injury or overuse.  No activity the day before.  She states a few days ago she lifted a box but this was not painful for her and she did not hurt the following day.  The pain is in the central low back and in the left SI region.  Radiates into the left buttock and upper thigh.  No numbness or weakness.  No bowel or bladder complaint.  No acute difficulty with gait.  When she stands up it takes her a while to come fully erect, she walks around bent over for the first few moments.  She has not taken any medication for pain. Initially the patient desired an x-ray.  She then stated that she was due for her period next week, and has been having unprotected sexual relations in an effort to become pregnant.  We decided not to do an x-ray.  In addition I reviewed all the medications prescribed for her to make sure they were safe during early pregnancy.  Past Medical History:  Diagnosis Date  . Diabetes mellitus without complication ([place])   . Hypertension   . Pyelonephritis   . Renal disorder    pyelonephritis  . Seizures [facility])     Patient Active Problem List   Diagnosis Date Noted  . Irregular menses 10/05/2013  . Irregular menstrual cycle 05/22/2012    Past Surgical History:  Procedure Laterality Date  . CHOLECYSTECTOMY      OB History    Gravida  2   Para  1   Term  1   Preterm      AB  1   Living  1     SAB  1   TAB      Ectopic      Multiple      Live Births  1            Home Medications    Prior to Admission medications   Medication Sig Start Date End Date Taking? Authorizing Provider  carisoprodol (SOMA) 350 MG tablet Take 1  tablet (350 mg total) by mouth 3 (three) times daily. 06/02/19   Raylene Everts, MD  fluticasone [facility]) 50 MCG/ACT nasal spray Place 2 sprays into both nostrils daily. 11/21/18   Raylene Everts, MD  ibuprofen (ADVIL) 800 MG tablet Take 1 tablet (800 mg total) by mouth 3 (three) times daily. 06/02/19   Raylene Everts, MD  losartan (COZAAR) 25 MG tablet  06/03/17   [provider]  LOSARTAN POTASSIUM PO Take by mouth.    [provider]  metFORMIN (GLUCOPHAGE) 500 MG tablet Take by mouth 2 (two) times daily with a meal.    [provider]  ipratropium (ATROVENT) 0.06 % nasal spray Place 2 sprays into both nostrils 4 (four) times daily. 04/20/18 11/21/18  Ok Edwards, PA-C    Family History Family History  Problem Relation Age of Onset  . Kidney disease Mother   . Hypertension Mother   . Sickle cell anemia Father   . Hyperlipidemia Maternal Grandmother     Social History Social  History   Tobacco Use  . Smoking status: Current Every Day Smoker    Packs/day: 0.50    Years: 7.00    Pack years: 3.50    Types: Cigarettes  . Smokeless tobacco: Never Used  Substance Use Topics  . Alcohol use: Yes    Comment: occ  . Drug use: No     Allergies   Amoxicillin, Penicillins, and Latex   Review of Systems Review of Systems  Musculoskeletal: Positive for back pain and gait problem.     Physical Exam Triage Vital Signs ED Triage Vitals  Enc Vitals Group     BP 06/02/19 1632 128/68     Pulse Rate 06/02/19 1632 (!) 110     Resp 06/02/19 1632 20     Temp 06/02/19 1632 98.9 F (37.2 C)     Temp Source 06/02/19 1632 Oral     SpO2 06/02/19 1632 100 %     Weight --      Height --      Head Circumference --      Peak Flow --      Pain Score 06/02/19 1631 9     Pain Loc --      Pain Edu? --      Excl. in GC? --    No data found.  Updated Vital Signs BP 128/68   Pulse (!) 110   Temp 98.9 F (37.2 C) (Oral)   Resp 20   SpO2 100%    Physical Exam Constitutional:      General: She is not in acute distress.    Appearance: She is well-developed. She is obese.  HENT:     Head: Normocephalic and atraumatic.     Nose:     Comments: Mask in place Eyes:     Conjunctiva/sclera: Conjunctivae normal.     Pupils: Pupils are equal, round, and reactive to light.  Cardiovascular:     Rate and Rhythm: Normal rate and regular rhythm.     Heart sounds: Normal heart sounds.  Pulmonary:     Effort: Pulmonary effort is normal. No respiratory distress.     Breath sounds: Normal breath sounds.  Musculoskeletal:        General: Normal range of motion.     Cervical back: Normal range of motion.     Comments: Tender to deep palpation in the left SI region.  Left buttock.  No tenderness over the left greater so trochanter.  Strength sensation range of motion and reflexes are normal in upper extremities.  Straight leg raise negative bilaterally  Skin:    General: Skin is warm and dry.  Neurological:     General: No focal deficit present.     Mental Status: She is alert.  Psychiatric:        Mood and Affect: Mood normal.        Behavior: Behavior normal.      UC Treatments / Results  Labs (all labs ordered are listed, but only abnormal results are displayed) Labs Reviewed - No data to display  EKG   Radiology No results found.  Procedures Procedures (including critical care time)  Medications Ordered in UC Medications - No data to display  Initial Impression / Assessment and Plan / UC Course  I have reviewed the triage vital signs and the nursing notes.  Pertinent labs & imaging results that were available during my care of the patient were reviewed by me and considered in my medical decision making (see  chart for details).      Final Clinical Impressions(s) / UC Diagnoses   Final diagnoses:  Acute right-sided low back pain with right-sided sciatica     Discharge Instructions     Take the ibuprofen 3 x  a day with food Take the soma as needed muscle relaxer This will not be refilled, may cause drowsiness Return if pain persists, we can do x ray if pain is still problematic after we determine you are not pregnant    ED Prescriptions    Medication Sig Dispense Auth. Provider   ibuprofen (ADVIL) 800 MG tablet Take 1 tablet (800 mg total) by mouth 3 (three) times daily. 21 tablet Eustace Moore, MD   carisoprodol (SOMA) 350 MG tablet Take 1 tablet (350 mg total) by mouth 3 (three) times daily. 15 tablet Eustace Moore, MD     I have reviewed the PDMP during this encounter.   Eustace Moore, MD 06/02/19 2036

## 2019-06-04 ENCOUNTER — Ambulatory Visit (INDEPENDENT_AMBULATORY_CARE_PROVIDER_SITE_OTHER): Payer: Medicaid Other | Admitting: Obstetrics

## 2019-06-04 ENCOUNTER — Other Ambulatory Visit: Payer: Self-pay

## 2019-06-04 ENCOUNTER — Encounter: Payer: Self-pay | Admitting: Obstetrics

## 2019-06-04 VITALS — BP 125/89 | HR 121 | Ht 70.0 in | Wt 267.0 lb

## 2019-06-04 DIAGNOSIS — N839 Noninflammatory disorder of ovary, fallopian tube and broad ligament, unspecified: Secondary | ICD-10-CM

## 2019-06-04 DIAGNOSIS — F17200 Nicotine dependence, unspecified, uncomplicated: Secondary | ICD-10-CM

## 2019-06-04 DIAGNOSIS — Z3169 Encounter for other general counseling and advice on procreation: Secondary | ICD-10-CM

## 2019-06-04 DIAGNOSIS — E669 Obesity, unspecified: Secondary | ICD-10-CM

## 2019-06-04 MED ORDER — VITAFOL ULTRA 29-0.6-0.4-200 MG PO CAPS: 1.0000 | ORAL_CAPSULE | Freq: Every day | ORAL | 11 refills | Status: DC

## 2019-06-04 NOTE — Progress Notes (Signed)
Patient presents to discuss trying to conceive. She states that she has been trying for about a year. She states that she has been having irregular cycles sometimes 2 cycles a month.

## 2019-06-04 NOTE — Progress Notes (Signed)
Patient ID: Joanne Waller, female   DOB: 24-Sep-1985, 34 y.o.   MRN: [id number]  No chief complaint on file.   HPI Joanne Waller is a 34 y.o. female.  Trying to conceive for the past year without success.  She is obese, diabetic, and hypertensive.  She has been told that she has PCOS.  Periods are irregular. HPI  Past Medical History:  Diagnosis Date  . Diabetes mellitus without complication (HCC)   . Hypertension   . Pyelonephritis   . Renal disorder    pyelonephritis  . Seizures (HCC)     Past Surgical History:  Procedure Laterality Date  . CHOLECYSTECTOMY      Family History  Problem Relation Age of Onset  . Kidney disease Mother   . Hypertension Mother   . Sickle cell anemia Father   . Hyperlipidemia Maternal Grandmother     Social History Social History   Tobacco Use  . Smoking status: Current Every Day Smoker    Packs/day: 0.50    Years: 7.00    Pack years: 3.50    Types: Cigarettes  . Smokeless tobacco: Never Used  Substance Use Topics  . Alcohol use: Yes    Comment: occ  . Drug use: No    Allergies  Allergen Reactions  . Amoxicillin Other (See Comments)    Pt stated, "I get a severe blinding headache"  . Penicillins Other (See Comments)    Sharp pain in temple   . Latex Itching, Swelling and Rash    Current Outpatient Medications  Medication Sig Dispense Refill  . carisoprodol (SOMA) 350 MG tablet Take 1 tablet (350 mg total) by mouth 3 (three) times daily. 15 tablet 0  . ibuprofen (ADVIL) 800 MG tablet Take 1 tablet (800 mg total) by mouth 3 (three) times daily. 21 tablet 0  . insulin glargine, 2 Unit Dial, (TOUJEO MAX SOLOSTAR) 300 UNIT/ML Solostar Pen     . losartan (COZAAR) 25 MG tablet   5  . metFORMIN (GLUCOPHAGE) 500 MG tablet Take by mouth 2 (two) times daily with a meal.    . fluticasone (FLONASE) 50 MCG/ACT nasal spray Place 2 sprays into both nostrils daily. 16 g 0  . JANUVIA 100 MG tablet Take 100 mg by mouth daily.    Marland Kitchen LOSARTAN  POTASSIUM PO Take by mouth.    . phentermine (ADIPEX-P) 37.5 MG tablet Take 37.5 mg by mouth every morning.    . Prenat-Fe Poly-Methfol-FA-DHA (VITAFOL ULTRA) 29-0.6-0.4-200 MG CAPS Take 1 capsule by mouth daily before breakfast. 30 capsule 11   No current facility-administered medications for this visit.    Review of Systems Review of Systems Constitutional: negative for fatigue and weight loss Respiratory: negative for cough and wheezing Cardiovascular: negative for chest pain, fatigue and palpitations Gastrointestinal: negative for abdominal pain and change in bowel habits Genitourinary:positive for irregular cycles Integument/breast: negative for nipple discharge Musculoskeletal:negative for myalgias Neurological: negative for gait problems and tremors Behavioral/Psych: negative for abusive relationship, depression Endocrine: negative for temperature intolerance      Blood pressure 125/89, pulse (!) 121, height 5\' 10"  (1.778 m), weight 267 lb (121.1 kg), last menstrual period 06/04/2019.  Physical Exam Physical Exam General:   alert  Skin:   no rash or abnormalities  Lungs:   clear to auscultation bilaterally  Heart:   regular rate and rhythm, S1, S2 normal, no murmur, click, rub or gallop  Mental: normal mood and affect  The remainder of the physical exam was deferred due to the nature of the encounter  >50% of 20 min visit spent on counseling and coordination of care.   Data Reviewed Labs  Assessment     1. Disorder of ovulation Rx: - US PELVIC COMPLETE WITH TRANSVAGINAL; Future - Prenat-Fe Poly-Methfol-FA-DHA (VITAFOL ULTRA) 29-0.6-0.4-200 MG CAPS; Take 1 capsule by mouth daily before breakfast.  Dispense: 30 capsule; Refill: 11  2. Obesity (BMI 35.0-39.9 without comorbidity) - program of caloric reduction, exercise and behavioral modification recommended  3. Encounter for preconception consultation  4. Tobacco dependence - tobacco cessation with  the aid of medication and behavioral modification recommended    Plan   Follow up in 4 months   She may need Clomid for ovulation induction  Orders Placed This Encounter  Procedures  . US PELVIC COMPLETE WITH TRANSVAGINAL    Standing Status:   Future    Standing Expiration Date:   08/03/2020    Order Specific Question:   Reason for Exam (SYMPTOM  OR DIAGNOSIS REQUIRED)    Answer:   Ovulatory Disorders    Order Specific Question:   Preferred imaging location?    Answer:   Internal   Meds ordered this encounter  Medications  . Prenat-Fe Poly-Methfol-FA-DHA (VITAFOL ULTRA) 29-0.6-0.4-200 MG CAPS    Sig: Take 1 capsule by mouth daily before breakfast.    Dispense:  30 capsule    Refill:  11     Shelly Bombard, MD 06/04/2019 3:49 PM

## 2019-06-16 ENCOUNTER — Other Ambulatory Visit: Payer: Self-pay

## 2019-06-16 ENCOUNTER — Ambulatory Visit (HOSPITAL_COMMUNITY)
Admission: RE | Admit: 2019-06-16 | Discharge: 2019-06-16 | Disposition: A | Discharge status: Home / Self Care | Payer: Medicaid Other | Source: Ambulatory Visit | Attending: Obstetrics | Admitting: Obstetrics

## 2019-06-16 DIAGNOSIS — N839 Noninflammatory disorder of ovary, fallopian tube and broad ligament, unspecified: Secondary | ICD-10-CM | POA: Diagnosis present

## 2019-07-13 ENCOUNTER — Ambulatory Visit (HOSPITAL_COMMUNITY)
Admission: EM | Admit: 2019-07-13 | Discharge: 2019-07-13 | Disposition: A | Discharge status: Home / Self Care | Payer: Medicaid Other | Attending: Family Medicine | Admitting: Family Medicine

## 2019-07-13 ENCOUNTER — Encounter (HOSPITAL_COMMUNITY): Payer: Self-pay | Admitting: Emergency Medicine

## 2019-07-13 ENCOUNTER — Other Ambulatory Visit: Payer: Self-pay

## 2019-07-13 ENCOUNTER — Other Ambulatory Visit: Payer: Medicaid Other

## 2019-07-13 DIAGNOSIS — Z20822 Contact with and (suspected) exposure to covid-19: Secondary | ICD-10-CM | POA: Insufficient documentation

## 2019-07-13 DIAGNOSIS — J069 Acute upper respiratory infection, unspecified: Secondary | ICD-10-CM | POA: Insufficient documentation

## 2019-07-13 MED ORDER — PSEUDOEPH-BROMPHEN-DM 30-2-10 MG/5ML PO SYRP
5.0000 mL | ORAL_SOLUTION | Freq: Four times a day (QID) | ORAL | 0 refills | Status: DC | PRN
Start: 2019-07-13 — End: 2020-08-24

## 2019-07-13 NOTE — Discharge Instructions (Signed)
You have been tested for COVID-19 today. °If your test returns positive, you will receive a phone call from [facility] regarding your results. °Negative test results are not called. °Both positive and negative results area always visible on MyChart. °If you do not have a MyChart account, sign up instructions are provided in your discharge papers. °Please do not hesitate to contact us should you have questions or concerns. ° °

## 2019-07-13 NOTE — ED Triage Notes (Signed)
Pt c/o exposure to covid on Friday. Pt states she started having cold symptoms including nasal congestion, headache, sore throat, non productive cough, and clamminess onset yesterday. Pt has not had her covid vaccine.

## 2019-07-13 NOTE — ED Provider Notes (Signed)
[facility]   [id number] 07/13/19 Arrival Time: 5188  ASSESSMENT & PLAN:  1. Viral URI with cough   2. Exposure to COVID-19 virus      COVID-19 testing sent. See letter/work note on file for self-isolation guidelines. OTC symptom care as needed.  Follow-up Information    [facility].   Specialty: Urgent Care Why: If worsening or failing to improve as anticipated. Contact information: [address] [place] [phone]         Meds ordered this encounter  Medications  . brompheniramine-pseudoephedrine-DM 30-2-10 MG/5ML syrup    Sig: Take 5 mLs by mouth 4 (four) times daily as needed.    Dispense:  120 mL    Refill:  0    Reviewed expectations re: course of current medical issues. Questions answered. Outlined signs and symptoms indicating need for more acute intervention. Understanding verbalized. After Visit Summary given.   SUBJECTIVE: History from: patient. Joanne Waller is a 34 y.o. female who requests COVID-19 testing. Known COVID-19 contact: work exposure recently. Recent travel: none. Reports: congestion and cough. Denies: fever, difficulty breathing and headache. Normal PO intake without n/v/d.    OBJECTIVE:  Vitals:   07/13/19 0949  BP: 114/61  Pulse: (!) 102  Resp: 16  Temp: 98.2 F (36.8 C)  TempSrc: Oral  SpO2: 100%    General appearance: alert; no distress Eyes: PERRLA; EOMI; conjunctiva normal HENT: [place]; AT; nasal congestion Neck: supple  Lungs: speaks full sentences without difficulty; unlabored Extremities: no edema Skin: warm and dry Neurologic: normal gait Psychological: alert and cooperative; normal mood and affect    Allergies  Allergen Reactions  . Amoxicillin Other (See Comments)    Pt stated, "I get a severe blinding headache"  . Penicillins Other (See Comments)    Sharp pain in temple   . Latex Itching, Swelling and Rash    Past Medical  History:  Diagnosis Date  . Diabetes mellitus without complication ([place])   . Hypertension   . Pyelonephritis   . Renal disorder    pyelonephritis  . Seizures ([place])    Social History   Socioeconomic History  . Marital status: Single    Spouse name: Not on file  . Number of children: Not on file  . Years of education: Not on file  . Highest education level: Not on file  Occupational History  . Not on file  Tobacco Use  . Smoking status: Current Every Day Smoker    Packs/day: 0.50    Years: 7.00    Pack years: 3.50    Types: Cigarettes  . Smokeless tobacco: Never Used  Substance and Sexual Activity  . Alcohol use: Yes    Comment: occ  . Drug use: No  . Sexual activity: Yes    Partners: Male    Birth control/protection: None  Other Topics Concern  . Not on file  Social History Narrative  . Not on file   Social Determinants of Health   Financial Resource Strain:   . Difficulty of Paying Living Expenses:   Food Insecurity:   . Worried About Charity fundraiser in the Last Year:   . Arboriculturist in the Last Year:   Transportation Needs:   . Film/video editor (Medical):   Marland Kitchen Lack of Transportation (Non-Medical):   Physical Activity:   . Days of Exercise per Week:   . Minutes of Exercise per Session:   Stress:   .  Feeling of Stress :   Social Connections:   . Frequency of Communication with Friends and Family:   . Frequency of Social Gatherings with Friends and Family:   . Attends Religious Services:   . Active Member of Clubs or Organizations:   . Attends Banker Meetings:   Marland Kitchen Marital Status:   Intimate Partner Violence:   . Fear of Current or Ex-Partner:   . Emotionally Abused:   Marland Kitchen Physically Abused:   . Sexually Abused:    Family History  Problem Relation Age of Onset  . Kidney disease Mother   . Hypertension Mother   . Sickle cell anemia Father   . Hyperlipidemia Maternal Grandmother    Past Surgical History:  Procedure  Laterality Date  . Barrington Ellison, MD 07/13/19 1008

## 2019-07-14 LAB — SARS CORONAVIRUS 2 (TAT 6-24 HRS): SARS Coronavirus 2: NEGATIVE

## 2019-07-24 ENCOUNTER — Other Ambulatory Visit: Payer: Self-pay

## 2019-07-24 DIAGNOSIS — N898 Other specified noninflammatory disorders of vagina: Secondary | ICD-10-CM

## 2019-07-24 MED ORDER — METRONIDAZOLE 500 MG PO TABS: 500.0000 mg | ORAL_TABLET | Freq: Two times a day (BID) | ORAL | 0 refills | Status: DC

## 2019-07-24 NOTE — Progress Notes (Signed)
Rx for BV sent per protocol to pt pharmacy  TC to pt to make aware pt not ava unable to leave vm If BV returns pt will need in office appt for vaginal swab.

## 2019-08-03 ENCOUNTER — Other Ambulatory Visit: Payer: Self-pay

## 2019-08-03 ENCOUNTER — Encounter (HOSPITAL_COMMUNITY): Payer: Self-pay | Admitting: Emergency Medicine

## 2019-08-03 ENCOUNTER — Ambulatory Visit (HOSPITAL_COMMUNITY)
Admission: EM | Admit: 2019-08-03 | Discharge: 2019-08-03 | Disposition: A | Discharge status: Home / Self Care | Payer: Medicaid Other

## 2019-08-03 ENCOUNTER — Ambulatory Visit (INDEPENDENT_AMBULATORY_CARE_PROVIDER_SITE_OTHER): Payer: Medicaid Other

## 2019-08-03 DIAGNOSIS — S93421A Sprain of deltoid ligament of right ankle, initial encounter: Secondary | ICD-10-CM

## 2019-08-03 DIAGNOSIS — M25571 Pain in right ankle and joints of right foot: Secondary | ICD-10-CM | POA: Diagnosis not present

## 2019-08-03 DIAGNOSIS — S93401A Sprain of unspecified ligament of right ankle, initial encounter: Secondary | ICD-10-CM | POA: Diagnosis not present

## 2019-08-03 MED ORDER — IBUPROFEN 600 MG PO TABS
600.0000 mg | ORAL_TABLET | Freq: Four times a day (QID) | ORAL | 0 refills | Status: DC | PRN
Start: 2019-08-03 — End: 2020-08-24

## 2019-08-03 MED ORDER — ACETAMINOPHEN 325 MG PO TABS
650.0000 mg | ORAL_TABLET | Freq: Four times a day (QID) | ORAL | 0 refills | Status: DC | PRN
Start: 2019-08-03 — End: 2021-02-25

## 2019-08-03 NOTE — ED Triage Notes (Signed)
Patient reports injuring right ankle yesterday.  Patient rolled ankle, and continues to have pain.  Pain is the inside of right ankle.  No visible swelling

## 2019-08-03 NOTE — ED Provider Notes (Signed)
[facility]    CSN: [id number] Arrival date & time: 08/03/19  1746      History   Chief Complaint Chief Complaint  Patient presents with  . Ankle Pain    HPI Joanne Waller is a 34 y.o. female.   Patient reports for evaluation of right ankle injury. She reports rolling the ankle yesterday. She is not completely sure which direction it rolled.  Reports pain on the inside of the ankle. She reports having to limp a lot to walk and has not been able to place much weight. Denies much swelling or discoloration.  Patient denies previous injury to the right ankle.     Past Medical History:  Diagnosis Date  . Diabetes mellitus without complication (HCC)   . Hypertension   . Pyelonephritis   . Renal disorder    pyelonephritis  . Seizures [facility])     Patient Active Problem List   Diagnosis Date Noted  . Irregular menses 10/05/2013  . Irregular menstrual cycle 05/22/2012    Past Surgical History:  Procedure Laterality Date  . CHOLECYSTECTOMY      OB History    Gravida  2   Para  1   Term  1   Preterm      AB  1   Living  1     SAB  1   TAB      Ectopic      Multiple      Live Births  1            Home Medications    Prior to Admission medications   Medication Sig Start Date End Date Taking? Authorizing Provider  insulin glargine, 2 Unit Dial, (TOUJEO MAX SOLOSTAR) 300 UNIT/ML Solostar Pen  05/07/19  Yes [provider]  JANUVIA 100 MG tablet Take 100 mg by mouth daily. 05/25/19  Yes [provider]  losartan (COZAAR) 25 MG tablet  06/03/17  Yes [provider]  metFORMIN (GLUCOPHAGE) 500 MG tablet Take by mouth 2 (two) times daily with a meal.   Yes [provider]  phentermine (ADIPEX-P) 37.5 MG tablet Take 37.5 mg by mouth every morning. 05/06/19  Yes [provider]  TOUJEO SOLOSTAR 300 UNIT/ML Solostar Pen SMARTSIG:30 Unit(s) SUB-Q Every Evening 06/06/19  Yes [provider]    acetaminophen (TYLENOL) 325 MG tablet Take 2 tablets (650 mg total) by mouth every 6 (six) hours as needed. 08/03/19   Eliam Snapp, Veryl Speak, PA-C  brompheniramine-pseudoephedrine-DM 30-2-10 MG/5ML syrup Take 5 mLs by mouth 4 (four) times daily as needed. 07/13/19   Mardella Layman, MD  fluticasone (FLONASE) 50 MCG/ACT nasal spray Place 2 sprays into both nostrils daily. 11/21/18   Eustace Moore, MD  ibuprofen (ADVIL) 600 MG tablet Take 1 tablet (600 mg total) by mouth every 6 (six) hours as needed. 08/03/19   Zanaria Morell, Veryl Speak, PA-C  LOSARTAN POTASSIUM PO Take by mouth.    [provider]  metroNIDAZOLE (FLAGYL) 500 MG tablet Take 1 tablet (500 mg total) by mouth 2 (two) times daily. 07/24/19   Constant, Peggy, MD  ipratropium (ATROVENT) 0.06 % nasal spray Place 2 sprays into both nostrils 4 (four) times daily. 04/20/18 11/21/18  Belinda Fisher, PA-C    Family History Family History  Problem Relation Age of Onset  . Kidney disease Mother   . Hypertension Mother   . Sickle cell anemia Father   . Hyperlipidemia Maternal Grandmother     Social History  Social History   Tobacco Use  . Smoking status: Current Every Day Smoker    Packs/day: 0.50    Years: 7.00    Pack years: 3.50    Types: Cigarettes  . Smokeless tobacco: Never Used  Vaping Use  . Vaping Use: Never used  Substance Use Topics  . Alcohol use: Yes    Comment: occ  . Drug use: No     Allergies   Amoxicillin, Penicillins, and Latex   Review of Systems Review of Systems   Physical Exam Triage Vital Signs ED Triage Vitals  Enc Vitals Group     BP 08/03/19 1836 128/86     Pulse Rate 08/03/19 1836 (!) 103     Resp 08/03/19 1836 20     Temp 08/03/19 1836 98.4 F (36.9 C)     Temp Source 08/03/19 1836 Oral     SpO2 08/03/19 1836 100 %     Weight --      Height --      Head Circumference --      Peak Flow --      Pain Score 08/03/19 1832 8     Pain Loc --      Pain Edu? --      Excl. in GC? --    No data  found.  Updated Vital Signs BP 128/86 (BP Location: Left Arm)   Pulse (!) 103   Temp 98.4 F (36.9 C) (Oral)   Resp 20   LMP 07/25/2019 (Exact Date)   SpO2 100%   Visual Acuity Right Eye Distance:   Left Eye Distance:   Bilateral Distance:    Right Eye Near:   Left Eye Near:    Bilateral Near:     Physical Exam Vitals and nursing note reviewed.  Constitutional:      Appearance: Normal appearance.  Cardiovascular:     Rate and Rhythm: Normal rate.  Pulmonary:     Effort: Pulmonary effort is normal. No respiratory distress.  Musculoskeletal:     Comments: Right ankle without swelling or ecchymosis or deformity.  There is posterior medial malleoli tenderness.  Patient unable to bear significant weight in clinic.  There is also tenderness over the deltoid ligament distribution.  Minimal tenderness over the lateral aspect of the ankle.  No base of the fifth metatarsal tenderness.  There is pain with manipulation of the ankle joint, however does not feel grossly unstable.  Distal pulses 2+.  Cap refill less than 2 seconds.  Neurological:     Mental Status: She is alert.      UC Treatments / Results  Labs (all labs ordered are listed, but only abnormal results are displayed) Labs Reviewed - No data to display  EKG   Radiology DG Ankle Complete Right  Result Date: 08/03/2019 CLINICAL DATA:  Rolled ankle yesterday.  Medial pain EXAM: RIGHT ANKLE - COMPLETE 3+ VIEW COMPARISON:  None. FINDINGS: Rounded corticated bone fragment adjacent to the medial malleolus likely related to old injury. No acute fracture. Joint spaces maintained. No subluxation or dislocation. Mild diffuse soft tissue swelling. IMPRESSION: No acute bony abnormality. Electronically Signed   By: Charlett Nose M.D.   On: 08/03/2019 19:09    Procedures Procedures (including critical care time)  Medications Ordered in UC Medications - No data to display  Initial Impression / Assessment and Plan / UC Course   I have reviewed the triage vital signs and the nursing notes.  Pertinent labs & imaging results that were  available during my care of the patient were reviewed by me and considered in my medical decision making (see chart for details).     #Deltoid ligament sprain Patient is 34 year old presenting with right ankle injury.  X-ray did show bone fragment location of injury, however radiologist read as possible old injury.  However given clinical correlation and patient denies history of injury, unsure if this could be acute avulsion.  Will place in cam and walker crutches.  We will have her follow-up with sports medicine.  Ibuprofen, ice and elevation recommended.  Patient verbalized understanding of plan of care. Final Clinical Impressions(s) / UC Diagnoses   Final diagnoses:  Sprain of deltoid ligament of right ankle, initial encounter     Discharge Instructions     Wear the boot and use the crutches  - do not place weight without these, you may place some weight using the boot and crutches  Schedule follow up with the sports medicine group this week  Take the ibuprofen and tylenol as prescribed  Ice and elevated tonight      ED Prescriptions    Medication Sig Dispense Auth. Provider   ibuprofen (ADVIL) 600 MG tablet Take 1 tablet (600 mg total) by mouth every 6 (six) hours as needed. 30 tablet Shadana Pry, Marguerita Beards, PA-C   acetaminophen (TYLENOL) 325 MG tablet Take 2 tablets (650 mg total) by mouth every 6 (six) hours as needed. 30 tablet Jamiesha Victoria, Marguerita Beards, PA-C     PDMP not reviewed this encounter.   Purnell Shoemaker, PA-C 08/04/19 [phone]

## 2019-08-03 NOTE — Discharge Instructions (Signed)
Wear the boot and use the crutches  - do not place weight without these, you may place some weight using the boot and crutches  Schedule follow up with the sports medicine group this week  Take the ibuprofen and tylenol as prescribed  Ice and elevated tonight

## 2019-08-12 DIAGNOSIS — E668 Other obesity: Secondary | ICD-10-CM | POA: Diagnosis not present

## 2019-08-12 DIAGNOSIS — F17210 Nicotine dependence, cigarettes, uncomplicated: Secondary | ICD-10-CM | POA: Diagnosis not present

## 2019-08-12 DIAGNOSIS — I10 Essential (primary) hypertension: Secondary | ICD-10-CM | POA: Diagnosis not present

## 2019-08-12 DIAGNOSIS — E1165 Type 2 diabetes mellitus with hyperglycemia: Secondary | ICD-10-CM | POA: Diagnosis not present

## 2019-08-14 ENCOUNTER — Ambulatory Visit: Payer: BC Managed Care – PPO | Admitting: Family Medicine

## 2019-08-14 ENCOUNTER — Other Ambulatory Visit: Payer: Self-pay

## 2019-08-14 VITALS — BP 122/92 | Ht 70.5 in | Wt 256.0 lb

## 2019-08-14 DIAGNOSIS — S82892A Other fracture of left lower leg, initial encounter for closed fracture: Secondary | ICD-10-CM | POA: Diagnosis not present

## 2019-08-14 HISTORY — DX: Other fracture of left lower leg, initial encounter for closed fracture: S82.892A

## 2019-08-14 NOTE — Assessment & Plan Note (Signed)
I would continue the boot and transition as she can to ASO brace which we gave her today.  Start home exercise program for ankle rehab.  We talked about normal healing process which in this case with small avulsion fracture be more on the lines of 6 to 8 weeks then 2 weeks.  I did review her films with her.  She will follow-up in 2 to 3 weeks.

## 2019-08-14 NOTE — Progress Notes (Signed)
  Joanne Waller - 34 y.o. female MRN [id number]  Date of birth: [birth year]    SUBJECTIVE:      Chief Complaint:/ HPI:  2 weeks ago she had eversion injury of her ankle.  Was seen in urgent care.  X-rays show small avulsion fracture from the medial malleolus.  She was placed in a tall fracture walker boot and she is used that since then.  She continues to have pain.  Continues to wear the boot most of the time.  She has been able to come out of it about 30 minutes a day.  Pain is medial malleolus.  No more swelling    OBJECTIVE: BP (!) 122/92   Ht 5' 10.5" (1.791 m)   Wt 256 lb (116.1 kg)   LMP 07/25/2019 (Exact Date)   BMI 36.21 kg/m   Physical Exam:  Vital signs are reviewed. GENERAL: Well-developed female no acute distress FEET: Bilateral pes planus.  Left ankle tender to palpation over the medial malleolus.  Intact range of motion in plantar flexion dorsiflexion with intact strength.  Normal eversion and inversion.  X-rays reviewed which show small avulsion fracture from the medial malleolus.  ASSESSMENT & PLAN:  See problem based charting & AVS for pt instructions. Closed avulsion fracture of left ankle I would continue the boot and transition as she can to ASO brace which we gave her today.  Start home exercise program for ankle rehab.  We talked about normal healing process which in this case with small avulsion fracture be more on the lines of 6 to 8 weeks then 2 weeks.  I did review her films with her.  She will follow-up in 2 to 3 weeks.

## 2019-08-14 NOTE — Patient Instructions (Signed)
Switch back and forth between the boot and the brace as you need to for pain.  Please do the exercises twice a day.  Do some additional icing if your pain increases or if your swelling increases.  The exercises are very important.  Let me see you back in 2 to 3 weeks.  Please call in the interim if there are any new problems or questions

## 2019-08-27 ENCOUNTER — Ambulatory Visit
Admission: RE | Admit: 2019-08-27 | Discharge: 2019-08-27 | Disposition: A | Discharge status: Home / Self Care | Payer: Medicaid Other | Source: Ambulatory Visit | Attending: Sports Medicine | Admitting: Sports Medicine

## 2019-08-27 ENCOUNTER — Ambulatory Visit: Payer: Medicaid Other | Admitting: Sports Medicine

## 2019-08-27 ENCOUNTER — Other Ambulatory Visit: Payer: Self-pay

## 2019-08-27 VITALS — BP 122/82 | Ht 71.0 in | Wt 255.0 lb

## 2019-08-27 DIAGNOSIS — M25571 Pain in right ankle and joints of right foot: Secondary | ICD-10-CM

## 2019-08-27 NOTE — Progress Notes (Addendum)
   Subjective:    Patient ID: Joanne Waller, female    DOB: 1985/10/03, 34 y.o.   MRN: [id number]  HPI chief complaint: Right ankle pain  Patient comes in today after having reinjured her right ankle.  She was recently treated for a right ankle sprain with CAM Walker immobilization, crutches, and eventually an ASO.  She was doing very well up until 5 days ago when she reinjured the ankle.  She was attempting to get away from a bug when she suffered an eversion injury to the same ankle.  She heard a pop at the time and developed swelling along the medial ankle.  She has had great difficulty ambulating since then.  She has resumed using her crutches and wearing her med spec brace but she found the cam walker to be too uncomfortable.  She localizes all of her pain to the medial ankle.  She denies pain at the lateral ankle.  No pain in her foot.  Previous x-rays suggested a possible old medial malleolar avulsion fracture.  She is also complaining of catching and locking intermittently of the ankle.    Review of Systems    Above Objective:   Physical Exam  Developed, well nourished.  No acute distress  Right ankle: There is moderate soft tissue swelling along the medial ankle.  She is tender to palpation diffusely in this area including over the medial malleolus as well as the area of the posterior tibialis tendon.  There is weakness with resisted foot inversion but this may be somewhat secondary to pain.  Significant pes planus with standing.  No tenderness to palpation along the lateral ankle.  No tenderness at the base of the fifth metatarsal nor at the navicular.  Good pulses.  Walking with an obvious limp.      Assessment & Plan:   Right ankle pain and swelling likely secondary to posterior tibialis tendon tear  Going to start with repeat x-rays to rule out fracture.  If unremarkable, we will proceed with an MRI specifically to rule out a posterior tibialis tendon tear.  Phone follow-up with  those results when available.  We will delineate a definitive treatment plan based on those findings.  In the meantime, patient is advised to continue ambulating with crutches.  I also advised returning to her cam walker if her pain will allow.  Addendum (August 31, 2019): X-rays show no obvious fracture.  Proceed with MRI scan to rule out posterior tibialis tendon tear.

## 2019-09-20 ENCOUNTER — Inpatient Hospital Stay: Admission: RE | Admit: 2019-09-20 | Payer: Medicaid Other | Source: Ambulatory Visit

## 2019-09-25 ENCOUNTER — Other Ambulatory Visit: Payer: Medicaid Other

## 2019-10-02 ENCOUNTER — Other Ambulatory Visit: Payer: Medicaid Other

## 2019-10-02 DIAGNOSIS — Z20828 Contact with and (suspected) exposure to other viral communicable diseases: Secondary | ICD-10-CM | POA: Diagnosis not present

## 2019-10-05 ENCOUNTER — Other Ambulatory Visit: Payer: Self-pay | Admitting: Unknown Physician Specialty

## 2019-10-05 ENCOUNTER — Telehealth: Payer: Self-pay | Admitting: Unknown Physician Specialty

## 2019-10-05 DIAGNOSIS — U071 COVID-19: Secondary | ICD-10-CM

## 2019-10-05 NOTE — Telephone Encounter (Signed)
I connected by phone with Rex Kras on 10/05/2019 at 10:54 AM to discuss the potential use of a new treatment for mild to moderate COVID-19 viral infection in non-hospitalized patients.  This patient is a 34 y.o. female that meets the FDA criteria for Emergency Use Authorization of COVID monoclonal antibody casirivimab/imdevimab.  Has a (+) direct SARS-CoV-2 viral test result  Has mild or moderate COVID-19   Is NOT hospitalized due to COVID-19  Is within 10 days of symptom onset  Has at least one of the high risk factor(s) for progression to severe COVID-19 and/or hospitalization as defined in EUA.  Specific high risk criteria : BMI > 25   I have spoken and communicated the following to the patient or parent/caregiver regarding COVID monoclonal antibody treatment:  1. FDA has authorized the emergency use for the treatment of mild to moderate COVID-19 in adults and pediatric patients with positive results of direct SARS-CoV-2 viral testing who are 44 years of age and older weighing at least 40 kg, and who are at high risk for progressing to severe COVID-19 and/or hospitalization.  2. The significant known and potential risks and benefits of COVID monoclonal antibody, and the extent to which such potential risks and benefits are unknown.  3. Information on available alternative treatments and the risks and benefits of those alternatives, including clinical trials.  4. Patients treated with COVID monoclonal antibody should continue to self-isolate and use infection control measures (e.g., wear mask, isolate, social distance, avoid sharing personal items, clean and disinfect high touch surfaces, and frequent handwashing) according to CDC guidelines.   5. The patient or parent/caregiver has the option to accept or refuse COVID monoclonal antibody treatment.  After reviewing this information with the patient, The patient agreed to proceed with receiving casirivimab\imdevimab infusion and  will be provided a copy of the Fact sheet prior to receiving the infusion. Gabriel Cirri 10/05/2019 10:54 AM  Sx onset 8/28

## 2019-10-06 ENCOUNTER — Ambulatory Visit (HOSPITAL_COMMUNITY): Payer: Medicaid Other | Attending: Pulmonary Disease

## 2019-10-07 DIAGNOSIS — Z6839 Body mass index (BMI) 39.0-39.9, adult: Secondary | ICD-10-CM | POA: Diagnosis not present

## 2019-10-07 DIAGNOSIS — I10 Essential (primary) hypertension: Secondary | ICD-10-CM | POA: Diagnosis not present

## 2019-10-07 DIAGNOSIS — E1165 Type 2 diabetes mellitus with hyperglycemia: Secondary | ICD-10-CM | POA: Diagnosis not present

## 2019-10-07 DIAGNOSIS — Z20822 Contact with and (suspected) exposure to covid-19: Secondary | ICD-10-CM | POA: Diagnosis not present

## 2019-10-08 ENCOUNTER — Other Ambulatory Visit (HOSPITAL_COMMUNITY)
Admission: RE | Admit: 2019-10-08 | Discharge: 2019-10-08 | Disposition: A | Discharge status: Home / Self Care | Payer: BC Managed Care – PPO | Source: Ambulatory Visit | Attending: Obstetrics | Admitting: Obstetrics

## 2019-10-08 ENCOUNTER — Other Ambulatory Visit: Payer: Self-pay

## 2019-10-08 ENCOUNTER — Ambulatory Visit (INDEPENDENT_AMBULATORY_CARE_PROVIDER_SITE_OTHER): Payer: BC Managed Care – PPO | Admitting: Obstetrics

## 2019-10-08 ENCOUNTER — Encounter: Payer: Self-pay | Admitting: Obstetrics

## 2019-10-08 VITALS — BP 116/90 | HR 110 | Ht 70.5 in | Wt 254.0 lb

## 2019-10-08 DIAGNOSIS — N760 Acute vaginitis: Secondary | ICD-10-CM | POA: Insufficient documentation

## 2019-10-08 DIAGNOSIS — E669 Obesity, unspecified: Secondary | ICD-10-CM

## 2019-10-08 DIAGNOSIS — B9689 Other specified bacterial agents as the cause of diseases classified elsewhere: Secondary | ICD-10-CM | POA: Diagnosis not present

## 2019-10-08 DIAGNOSIS — N839 Noninflammatory disorder of ovary, fallopian tube and broad ligament, unspecified: Secondary | ICD-10-CM

## 2019-10-08 DIAGNOSIS — Z3169 Encounter for other general counseling and advice on procreation: Secondary | ICD-10-CM

## 2019-10-08 DIAGNOSIS — Z113 Encounter for screening for infections with a predominantly sexual mode of transmission: Secondary | ICD-10-CM | POA: Insufficient documentation

## 2019-10-08 DIAGNOSIS — Z20828 Contact with and (suspected) exposure to other viral communicable diseases: Secondary | ICD-10-CM | POA: Diagnosis not present

## 2019-10-08 DIAGNOSIS — N898 Other specified noninflammatory disorders of vagina: Secondary | ICD-10-CM

## 2019-10-08 NOTE — Progress Notes (Signed)
Patient ID: Joanne Waller, female   DOB: 1985/06/06, 34 y.o.   MRN: [id number]  Chief Complaint  Patient presents with   Follow-up    HPI Joanne Waller is a 34 y.o. female.  Complains of vaginal discharge with odor.  Denies vaginal irritation, dysuria.  Recently found out that partner is HIV positive, and he has undetectable levels of virus and is on medication. HPI  Past Medical History:  Diagnosis Date   Diabetes mellitus without complication (HCC)    Hypertension    Pyelonephritis    Renal disorder    pyelonephritis   Seizures (HCC)     Past Surgical History:  Procedure Laterality Date   CHOLECYSTECTOMY      Family History  Problem Relation Age of Onset   Kidney disease Mother    Hypertension Mother    Sickle cell anemia Father    Hyperlipidemia Maternal Grandmother     Social History Social History   Tobacco Use   Smoking status: Current Every Day Smoker    Packs/day: 0.50    Years: 7.00    Pack years: 3.50    Types: Cigarettes   Smokeless tobacco: Never Used  Building services engineer Use: Never used  Substance Use Topics   Alcohol use: Yes    Comment: occ   Drug use: No    Allergies  Allergen Reactions   Amoxicillin Other (See Comments)    Pt stated, "I get a severe blinding headache"   Penicillins Other (See Comments)    Sharp pain in temple    Latex Itching, Swelling and Rash    Current Outpatient Medications  Medication Sig Dispense Refill   acetaminophen (TYLENOL) 325 MG tablet Take 2 tablets (650 mg total) by mouth every 6 (six) hours as needed. 30 tablet 0   brompheniramine-pseudoephedrine-DM 30-2-10 MG/5ML syrup Take 5 mLs by mouth 4 (four) times daily as needed. 120 mL 0   fluticasone (FLONASE) 50 MCG/ACT nasal spray Place 2 sprays into both nostrils daily. 16 g 0   ibuprofen (ADVIL) 600 MG tablet Take 1 tablet (600 mg total) by mouth every 6 (six) hours as needed. 30 tablet 0   insulin glargine, 2 Unit Dial, (TOUJEO MAX  SOLOSTAR) 300 UNIT/ML Solostar Pen      JANUVIA 100 MG tablet Take 100 mg by mouth daily.     losartan (COZAAR) 25 MG tablet   5   LOSARTAN POTASSIUM PO Take by mouth.     metFORMIN (GLUCOPHAGE) 500 MG tablet Take by mouth 2 (two) times daily with a meal.     metroNIDAZOLE (FLAGYL) 500 MG tablet Take 1 tablet (500 mg total) by mouth 2 (two) times daily. 14 tablet 0   phentermine (ADIPEX-P) 37.5 MG tablet Take 37.5 mg by mouth every morning.     TOUJEO SOLOSTAR 300 UNIT/ML Solostar Pen SMARTSIG:30 Unit(s) SUB-Q Every Evening     No current facility-administered medications for this visit.    Review of Systems Review of Systems Constitutional: negative for fatigue and weight loss Respiratory: negative for cough and wheezing Cardiovascular: negative for chest pain, fatigue and palpitations Gastrointestinal: negative for abdominal pain and change in bowel habits Genitourinary: positive for vaginal discharge with odor Integument/breast: negative for nipple discharge Musculoskeletal:negative for myalgias Neurological: negative for gait problems and tremors Behavioral/Psych: negative for abusive relationship, depression Endocrine: negative for temperature intolerance      Blood pressure 116/90, pulse (!) 110, height 5' 10.5" (1.791 m), weight 254 lb (115.2 kg).  Physical Exam Physical Exam           General:  Alert and no distress Abdomen:  normal findings: no organomegaly, soft, non-tender and no hernia  Pelvis:  External genitalia: normal general appearance Urinary system: urethral meatus normal and bladder without fullness, nontender Vaginal: normal without tenderness, induration or masses Cervix: normal appearance Adnexa: normal bimanual exam Uterus: anteverted and non-tender, normal size    50% of 15 min visit spent on counseling and coordination of care.   Data Reviewed Wet Prep Cultures  Assessment       1. Screening examination for STD (sexually transmitted  disease) Rx: - Hepatitis C antibody - Hepatitis B surface antigen - HIV Antibody (routine testing w rflx) - RPR  2. Vaginal discharge Rx: - Cervicovaginal ancillary only( [place])  3. Obesity (BMI 35.0-39.9 without comorbidity) - program of caloric reduction, exercise and behavioral modification  4. Disorder of ovulation Rx: - Ambulatory referral to Endocrinology  5. Encounter for preconception consultation - continue folic acid, healthy diet with plenty of fluids and fiber  Plan   Follow up in 3 months for Annual / Pap  Orders Placed This Encounter  Procedures   Hepatitis C antibody   Hepatitis B surface antigen   HIV Antibody (routine testing w rflx)   RPR     Brock Bad, MD 10/08/2019 10:20 AM

## 2019-10-08 NOTE — Progress Notes (Signed)
RGYN patient presents for F/U visit from 06/04/19 visit.   Pt still trying to conceive. Still has not been successful.  LMP: 09/30/19  Last pap: 11/05/2018 WNL

## 2019-10-09 ENCOUNTER — Telehealth: Payer: Self-pay | Admitting: *Deleted

## 2019-10-09 LAB — HEPATITIS C ANTIBODY: Hep C Virus Ab: <0.1 s/co ratio (ref 0.0–0.9)

## 2019-10-09 LAB — RPR: RPR Ser Ql: NONREACTIVE

## 2019-10-09 LAB — HIV ANTIBODY (ROUTINE TESTING W REFLEX): HIV Screen 4th Generation wRfx: NONREACTIVE

## 2019-10-09 LAB — HEPATITIS B SURFACE ANTIGEN: Hepatitis B Surface Ag: NEGATIVE

## 2019-10-09 NOTE — Telephone Encounter (Signed)
Pt called to office stating that she does plan to see someone for infertility. Pt would like to know of Dr Clearance Coots could start her on Clomid until that time.   Per Dr Clearance Coots, pt should be given medication by infertility provider if she plan that route.   LM on pt VM making her aware.

## 2019-10-13 ENCOUNTER — Other Ambulatory Visit: Payer: Self-pay | Admitting: Obstetrics

## 2019-10-13 DIAGNOSIS — N898 Other specified noninflammatory disorders of vagina: Secondary | ICD-10-CM

## 2019-10-13 LAB — CERVICOVAGINAL ANCILLARY ONLY
Bacterial Vaginitis (gardnerella): POSITIVE — AB
Candida Glabrata: NEGATIVE
Candida Vaginitis: NEGATIVE
Chlamydia: NEGATIVE
Comment: NEGATIVE
Comment: NEGATIVE
Comment: NEGATIVE
Comment: NEGATIVE
Comment: NEGATIVE
Comment: NORMAL
Neisseria Gonorrhea: NEGATIVE
Trichomonas: NEGATIVE

## 2019-10-13 MED ORDER — METRONIDAZOLE 500 MG PO TABS: 500.0000 mg | ORAL_TABLET | Freq: Two times a day (BID) | ORAL | 0 refills | Status: DC

## 2019-10-14 ENCOUNTER — Other Ambulatory Visit: Payer: Medicaid Other

## 2019-11-04 DIAGNOSIS — Z319 Encounter for procreative management, unspecified: Secondary | ICD-10-CM | POA: Diagnosis not present

## 2019-11-04 DIAGNOSIS — E669 Obesity, unspecified: Secondary | ICD-10-CM | POA: Diagnosis not present

## 2019-11-04 DIAGNOSIS — E288 Other ovarian dysfunction: Secondary | ICD-10-CM | POA: Diagnosis not present

## 2019-11-10 DIAGNOSIS — Z3202 Encounter for pregnancy test, result negative: Secondary | ICD-10-CM | POA: Diagnosis not present

## 2019-11-10 DIAGNOSIS — Z3141 Encounter for fertility testing: Secondary | ICD-10-CM | POA: Diagnosis not present

## 2019-11-17 DIAGNOSIS — Z319 Encounter for procreative management, unspecified: Secondary | ICD-10-CM | POA: Diagnosis not present

## 2019-11-20 DIAGNOSIS — E668 Other obesity: Secondary | ICD-10-CM | POA: Diagnosis not present

## 2019-11-20 DIAGNOSIS — Z716 Tobacco abuse counseling: Secondary | ICD-10-CM | POA: Diagnosis not present

## 2019-11-20 DIAGNOSIS — F17210 Nicotine dependence, cigarettes, uncomplicated: Secondary | ICD-10-CM | POA: Diagnosis not present

## 2019-11-20 DIAGNOSIS — I10 Essential (primary) hypertension: Secondary | ICD-10-CM | POA: Diagnosis not present

## 2019-11-20 DIAGNOSIS — E559 Vitamin D deficiency, unspecified: Secondary | ICD-10-CM | POA: Diagnosis not present

## 2019-11-20 DIAGNOSIS — E1165 Type 2 diabetes mellitus with hyperglycemia: Secondary | ICD-10-CM | POA: Diagnosis not present

## 2019-11-20 DIAGNOSIS — Z6839 Body mass index (BMI) 39.0-39.9, adult: Secondary | ICD-10-CM | POA: Diagnosis not present

## 2019-11-20 DIAGNOSIS — Z0000 Encounter for general adult medical examination without abnormal findings: Secondary | ICD-10-CM | POA: Diagnosis not present

## 2019-11-20 DIAGNOSIS — Z717 Human immunodeficiency virus [HIV] counseling: Secondary | ICD-10-CM | POA: Diagnosis not present

## 2019-12-04 DIAGNOSIS — Z319 Encounter for procreative management, unspecified: Secondary | ICD-10-CM | POA: Diagnosis not present

## 2019-12-04 DIAGNOSIS — E288 Other ovarian dysfunction: Secondary | ICD-10-CM | POA: Diagnosis not present

## 2019-12-14 ENCOUNTER — Other Ambulatory Visit: Payer: Self-pay | Admitting: Obstetrics and Gynecology

## 2019-12-14 DIAGNOSIS — E221 Hyperprolactinemia: Secondary | ICD-10-CM

## 2019-12-15 ENCOUNTER — Encounter: Payer: Self-pay | Admitting: Obstetrics

## 2019-12-17 ENCOUNTER — Other Ambulatory Visit: Payer: BC Managed Care – PPO

## 2019-12-21 ENCOUNTER — Other Ambulatory Visit: Payer: Self-pay

## 2019-12-21 MED ORDER — METRONIDAZOLE 500 MG PO TABS
500.0000 mg | ORAL_TABLET | Freq: Two times a day (BID) | ORAL | 0 refills | Status: DC
Start: 2019-12-21 — End: 2020-08-24

## 2019-12-21 NOTE — Telephone Encounter (Signed)
Patient would like a refill on flagyl to help with BV.

## 2020-01-09 ENCOUNTER — Other Ambulatory Visit: Payer: BC Managed Care – PPO

## 2020-01-10 ENCOUNTER — Other Ambulatory Visit: Payer: Self-pay

## 2020-01-10 ENCOUNTER — Ambulatory Visit
Admission: RE | Admit: 2020-01-10 | Discharge: 2020-01-10 | Disposition: A | Discharge status: Home / Self Care | Payer: BC Managed Care – PPO | Source: Ambulatory Visit | Attending: Obstetrics and Gynecology | Admitting: Obstetrics and Gynecology

## 2020-01-10 DIAGNOSIS — G9389 Other specified disorders of brain: Secondary | ICD-10-CM | POA: Diagnosis not present

## 2020-01-10 DIAGNOSIS — E221 Hyperprolactinemia: Secondary | ICD-10-CM | POA: Diagnosis not present

## 2020-01-10 DIAGNOSIS — J322 Chronic ethmoidal sinusitis: Secondary | ICD-10-CM | POA: Diagnosis not present

## 2020-01-10 DIAGNOSIS — J0120 Acute ethmoidal sinusitis, unspecified: Secondary | ICD-10-CM | POA: Diagnosis not present

## 2020-01-10 MED ORDER — GADOBENATE DIMEGLUMINE 529 MG/ML IV SOLN
15.0000 mL | Freq: Once | INTRAVENOUS | Status: AC | PRN
  Administered 2020-01-10: 15 mL via INTRAVENOUS

## 2020-01-19 DIAGNOSIS — Z1152 Encounter for screening for COVID-19: Secondary | ICD-10-CM | POA: Diagnosis not present

## 2020-01-21 DIAGNOSIS — Z20822 Contact with and (suspected) exposure to covid-19: Secondary | ICD-10-CM | POA: Diagnosis not present

## 2020-03-08 DIAGNOSIS — E221 Hyperprolactinemia: Secondary | ICD-10-CM | POA: Diagnosis not present

## 2020-03-08 DIAGNOSIS — E288 Other ovarian dysfunction: Secondary | ICD-10-CM | POA: Diagnosis not present

## 2020-03-08 DIAGNOSIS — Z319 Encounter for procreative management, unspecified: Secondary | ICD-10-CM | POA: Diagnosis not present

## 2020-03-29 DIAGNOSIS — Z319 Encounter for procreative management, unspecified: Secondary | ICD-10-CM | POA: Diagnosis not present

## 2020-04-15 DIAGNOSIS — Z3189 Encounter for other procreative management: Secondary | ICD-10-CM | POA: Diagnosis not present

## 2020-04-25 DIAGNOSIS — Z319 Encounter for procreative management, unspecified: Secondary | ICD-10-CM | POA: Diagnosis not present

## 2020-04-27 DIAGNOSIS — E1165 Type 2 diabetes mellitus with hyperglycemia: Secondary | ICD-10-CM | POA: Diagnosis not present

## 2020-04-27 DIAGNOSIS — I10 Essential (primary) hypertension: Secondary | ICD-10-CM | POA: Diagnosis not present

## 2020-05-05 DIAGNOSIS — E288 Other ovarian dysfunction: Secondary | ICD-10-CM | POA: Diagnosis not present

## 2020-05-05 DIAGNOSIS — E221 Hyperprolactinemia: Secondary | ICD-10-CM | POA: Diagnosis not present

## 2020-05-05 DIAGNOSIS — Z319 Encounter for procreative management, unspecified: Secondary | ICD-10-CM | POA: Diagnosis not present

## 2020-05-13 ENCOUNTER — Encounter: Payer: Self-pay | Admitting: Obstetrics

## 2020-05-13 ENCOUNTER — Other Ambulatory Visit (HOSPITAL_COMMUNITY)
Admission: RE | Admit: 2020-05-13 | Discharge: 2020-05-13 | Disposition: A | Discharge status: Home / Self Care | Payer: BC Managed Care – PPO | Source: Ambulatory Visit | Attending: Obstetrics | Admitting: Obstetrics

## 2020-05-13 ENCOUNTER — Other Ambulatory Visit: Payer: Self-pay

## 2020-05-13 ENCOUNTER — Ambulatory Visit (INDEPENDENT_AMBULATORY_CARE_PROVIDER_SITE_OTHER): Payer: BC Managed Care – PPO | Admitting: Obstetrics

## 2020-05-13 VITALS — BP 119/84 | HR 90 | Ht 70.0 in | Wt 268.0 lb

## 2020-05-13 DIAGNOSIS — Z113 Encounter for screening for infections with a predominantly sexual mode of transmission: Secondary | ICD-10-CM | POA: Diagnosis not present

## 2020-05-13 DIAGNOSIS — N898 Other specified noninflammatory disorders of vagina: Secondary | ICD-10-CM | POA: Diagnosis not present

## 2020-05-13 DIAGNOSIS — Z01419 Encounter for gynecological examination (general) (routine) without abnormal findings: Secondary | ICD-10-CM | POA: Diagnosis not present

## 2020-05-13 DIAGNOSIS — N839 Noninflammatory disorder of ovary, fallopian tube and broad ligament, unspecified: Secondary | ICD-10-CM | POA: Diagnosis not present

## 2020-05-13 DIAGNOSIS — F17200 Nicotine dependence, unspecified, uncomplicated: Secondary | ICD-10-CM

## 2020-05-13 DIAGNOSIS — E669 Obesity, unspecified: Secondary | ICD-10-CM

## 2020-05-13 DIAGNOSIS — N760 Acute vaginitis: Secondary | ICD-10-CM

## 2020-05-13 DIAGNOSIS — B9689 Other specified bacterial agents as the cause of diseases classified elsewhere: Secondary | ICD-10-CM

## 2020-05-13 MED ORDER — METRONIDAZOLE 500 MG PO TABS: 500.0000 mg | ORAL_TABLET | Freq: Two times a day (BID) | ORAL | 2 refills | Status: DC

## 2020-05-13 NOTE — Patient Instructions (Signed)
Bacterial Vaginosis  Bacterial vaginosis is an infection that occurs when the normal balance of bacteria in the vagina changes. This change is caused by an overgrowth of certain bacteria in the vagina. Bacterial vaginosis is the most common vaginal infection among females aged 33 to 44 years. This condition increases the risk of sexually transmitted infections (STIs). Treatment can help reduce this risk. Treatment is very important for pregnant women because this condition can cause babies to be born early (prematurely) or at a low birth weight. What are the causes? This condition is caused by an increase in harmful bacteria that are normally present in small amounts in the vagina. However, the exact reason this condition develops is not known. You cannot get bacterial vaginosis from toilet seats, bedding, swimming pools, or contact with objects around you. What increases the risk? The following factors may make you more likely to develop this condition:  Having a new sexual partner or multiple sexual partners, or having unprotected sex.  Douching.  Having an intrauterine device (IUD).  Smoking.  Abusing drugs and alcohol. This may lead to riskier sexual behavior.  Taking certain antibiotic medicines.  Being pregnant. What are the signs or symptoms? Some women with this condition have no symptoms. Symptoms may include:  Gray or white vaginal discharge. The discharge can be watery or foamy.  A fish-like odor with discharge, especially after sex or during menstruation.  Itching in and around the vagina.  Burning or pain with urination. How is this diagnosed? This condition is diagnosed based on:  Your medical history.  A physical exam of the vagina.  Checking a sample of vaginal fluid for harmful bacteria or abnormal cells. How is this treated? This condition is treated with antibiotic medicines. These may be given as a pill, a vaginal cream, or a medicine that is put into the  vagina (suppository). If the condition comes back after treatment, a second round of antibiotics may be needed. Follow these instructions at home: Medicines  Take or apply over-the-counter and prescription medicines only as told by your health care provider.  Take or apply your antibiotic medicine as told by your health care provider. Do not stop using the antibiotic even if you start to feel better. General instructions  If you have a female sexual partner, tell her that you have a vaginal infection. She should follow up with her health care provider. If you have a female sexual partner, he does not need treatment.  Avoid sexual activity until you finish treatment.  Drink enough fluid to keep your urine pale yellow.  Keep the area around your vagina and rectum clean. ? Wash the area daily with warm water. ? Wipe yourself from front to back after using the toilet.  If you are breastfeeding, talk to your health care provider about continuing breastfeeding during treatment.  Keep all follow-up visits. This is important. How is this prevented? Self-care  Do not douche.  Wash the outside of your vagina with warm water only.  Wear cotton or cotton-lined underwear.  Avoid wearing tight pants and pantyhose, especially during the summer. Safe sex  Use protection when having sex. This includes: ? Using condoms. ? Using dental dams. This is a thin layer of a material made of latex or polyurethane that protects the mouth during oral sex.  Limit the number of sexual partners. To help prevent bacterial vaginosis, it is best to have sex with just one partner (monogamous relationship).  Make sure you and your sexual partner   are tested for STIs. Drugs and alcohol  Do not use any products that contain nicotine or tobacco. These products include cigarettes, chewing tobacco, and vaping devices, such as e-cigarettes. If you need help quitting, ask your health care provider.  Do not use  drugs.  Do not drink alcohol if: ? Your health care provider tells you not to do this. ? You are pregnant, may be pregnant, or are planning to become pregnant.  If you drink alcohol: ? Limit how much you have to 0-1 drink a day. ? Be aware of how much alcohol is in your drink. In the [place] one drink equals one 12 oz bottle of beer (355 mL), one 5 oz glass of wine (148 mL), or one 1 oz glass of hard liquor (44 mL). Where to find more information  Centers for Disease Control and Prevention: www.cdc.gov  American Sexual Health Association (ASHA): www.ashastd.org  [place] Department of Health and Human Services, Office on Women's Health: www.womenshealth.gov Contact a health care provider if:  Your symptoms do not improve, even after treatment.  You have more discharge or pain when urinating.  You have a fever or chills.  You have pain in your abdomen or pelvis.  You have pain during sex.  You have vaginal bleeding between menstrual periods. Summary  Bacterial vaginosis is a vaginal infection that occurs when the normal balance of bacteria in the vagina changes. It results from an overgrowth of certain bacteria.  This condition increases the risk of sexually transmitted infections (STIs). Getting treated can help reduce this risk.  Treatment is very important for pregnant women because this condition can cause babies to be born early (prematurely) or at low birth weight.  This condition is treated with antibiotic medicines. These may be given as a pill, a vaginal cream, or a medicine that is put into the vagina (suppository). This information is not intended to replace advice given to you by your health care provider. Make sure you discuss any questions you have with your health care provider. Document Revised: 07/23/2019 Document Reviewed: 07/23/2019 Elsevier Patient Education  2021 Elsevier Inc.  

## 2020-05-13 NOTE — Progress Notes (Signed)
Patient presents for Annual Exam  LMP:05/13/20 Last pap:11/05/2018 WNL  STD Screening:Desires Full Panel made aware ins may not cover at  100% pt elects to still have testing Contraception: None Family Hx of Breast Cancer: None   Pt notes still trying to conceive.  Pt has seen infertility office last week and will have F/U soon.   CC: Vaginal discharge possible BV Infections.

## 2020-05-13 NOTE — Progress Notes (Signed)
Subjective:        Joanne Waller is a 35 y.o. female here for a routine exam.  Current complaints: Malodorous vaginal discharge.    Personal health questionnaire:  Is patient Ashkenazi Jewish, have a family history of breast and/or ovarian cancer: no Is there a family history of uterine cancer diagnosed at age < 5, gastrointestinal cancer, urinary tract cancer, family member who is a Personnel officer syndrome-associated carrier: no Is the patient overweight and hypertensive, family history of diabetes, personal history of gestational diabetes, preeclampsia or PCOS: no Is patient over 5, have PCOS,  family history of premature CHD under age 37, diabetes, smoke, have hypertension or peripheral artery disease:  no At any time, has a partner hit, kicked or otherwise hurt or frightened you?: no Over the past 2 weeks, have you felt down, depressed or hopeless?: no Over the past 2 weeks, have you felt little interest or pleasure in doing things?:no   Gynecologic History Patient's last menstrual period was 05/13/2020 (exact date). Contraception: none Last Pap: 11-05-2018. Results were: normal Last mammogram: n/a. Results were: n/a  Obstetric History OB History  Gravida Para Term Preterm AB Living  2 1 1   1 1   SAB IAB Ectopic Multiple Live Births  1       1    # Outcome Date GA Lbr Len/2nd Weight Sex Delivery Anes PTL Lv  2 SAB 10/06/08        DEC  1 Term 03/17/04    M Vag-Spont   LIV    Past Medical History:  Diagnosis Date  . Diabetes mellitus without complication (HCC)   . Hypertension   . Pyelonephritis   . Renal disorder    pyelonephritis  . Seizures (HCC)     Past Surgical History:  Procedure Laterality Date  . CHOLECYSTECTOMY       Current Outpatient Medications:  .  acetaminophen (TYLENOL) 325 MG tablet, Take 2 tablets (650 mg total) by mouth every 6 (six) hours as needed., Disp: 30 tablet, Rfl: 0 .  brompheniramine-pseudoephedrine-DM 30-2-10 MG/5ML syrup, Take 5 mLs by  mouth 4 (four) times daily as needed., Disp: 120 mL, Rfl: 0 .  fluticasone (FLONASE) 50 MCG/ACT nasal spray, Place 2 sprays into both nostrils daily., Disp: 16 g, Rfl: 0 .  ibuprofen (ADVIL) 600 MG tablet, Take 1 tablet (600 mg total) by mouth every 6 (six) hours as needed., Disp: 30 tablet, Rfl: 0 .  insulin glargine, 2 Unit Dial, (TOUJEO MAX SOLOSTAR) 300 UNIT/ML Solostar Pen, , Disp: , Rfl:  .  JANUVIA 100 MG tablet, Take 100 mg by mouth daily., Disp: , Rfl:  .  losartan (COZAAR) 25 MG tablet, , Disp: , Rfl: 5 .  LOSARTAN POTASSIUM PO, Take by mouth., Disp: , Rfl:  .  metFORMIN (GLUCOPHAGE) 500 MG tablet, Take by mouth 2 (two) times daily with a meal., Disp: , Rfl:  .  metroNIDAZOLE (FLAGYL) 500 MG tablet, Take 1 tablet (500 mg total) by mouth 2 (two) times daily., Disp: 14 tablet, Rfl: 0 .  metroNIDAZOLE (FLAGYL) 500 MG tablet, Take 1 tablet (500 mg total) by mouth 2 (two) times daily., Disp: 14 tablet, Rfl: 0 .  phentermine (ADIPEX-P) 37.5 MG tablet, Take 37.5 mg by mouth every morning., Disp: , Rfl:  .  TOUJEO SOLOSTAR 300 UNIT/ML Solostar Pen, SMARTSIG:30 Unit(s) SUB-Q Every Evening, Disp: , Rfl:  Allergies  Allergen Reactions  . Amoxicillin Other (See Comments)    Pt stated, "I get  a severe blinding headache"  . Penicillins Other (See Comments)    Sharp pain in temple   . Latex Itching, Swelling and Rash    Social History   Tobacco Use  . Smoking status: Current Every Day Smoker    Packs/day: 0.50    Years: 7.00    Pack years: 3.50    Types: Cigarettes  . Smokeless tobacco: Never Used  Substance Use Topics  . Alcohol use: Yes    Comment: occ    Family History  Problem Relation Age of Onset  . Kidney disease Mother   . Hypertension Mother   . Sickle cell anemia Father   . Hyperlipidemia Maternal Grandmother       Review of Systems  Constitutional: negative for fatigue and weight loss Respiratory: negative for cough and wheezing Cardiovascular: negative for chest  pain, fatigue and palpitations Gastrointestinal: negative for abdominal pain and change in bowel habits Musculoskeletal:negative for myalgias Neurological: negative for gait problems and tremors Behavioral/Psych: negative for abusive relationship, depression Endocrine: negative for temperature intolerance    Genitourinary:negative for abnormal menstrual periods, genital lesions, hot flashes, sexual problems.   Positive for malodorous vaginal discharge Integument/breast: negative for breast lump, breast tenderness, nipple discharge and skin lesion(s)    Objective:       Ht 5\' 10"  (1.778 m)   Wt 268 lb (121.6 kg)   LMP 05/13/2020 (Exact Date)   BMI 38.45 kg/m  General:   alert and no distress  Skin:   no rash or abnormalities  Lungs:   clear to auscultation bilaterally  Heart:   regular rate and rhythm, S1, S2 normal, no murmur, click, rub or gallop  Breasts:   normal without suspicious masses, skin or nipple changes or axillary nodes  Abdomen:  normal findings: no organomegaly, soft, non-tender and no hernia  Pelvis:  External genitalia: normal general appearance Urinary system: urethral meatus normal and bladder without fullness, nontender Vaginal: normal without tenderness, induration or masses Cervix: normal appearance Adnexa: normal bimanual exam Uterus: anteverted and non-tender, normal size   Lab Review Urine pregnancy test Labs reviewed yes Radiologic studies reviewed no  I have spent a total of 20 minutes of face-to-face time, excluding clinical staff time, reviewing notes and preparing to see patient, ordering tests and/or medications, and counseling the patient.   Assessment:     1. Encounter for gynecological examination with Papanicolaou smear of cervix Rx: - Cytology - PAP  2. Vaginal discharge Rx: - Cervicovaginal ancillary only( [place])  3. Screening for STD (sexually transmitted disease) Rx: - RPR+HBsAg+HCVAb+..  4. Disorder of ovulation -  being seen by Reproductive Endocrinology  5. Obesity (BMI 35.0-39.9 without comorbidity) - program of caloric reduction, exercise and behavioral modification recommended  6. Tobacco dependence - cessation with the aid of medication and behavioral modification recommended  7. BV (bacterial vaginosis) Rx: - metroNIDAZOLE (FLAGYL) 500 MG tablet; Take 1 tablet (500 mg total) by mouth 2 (two) times daily.  Dispense: 14 tablet; Refill: 2    Plan:    Education reviewed: calcium supplements, depression evaluation, low fat, low cholesterol diet, safe sex/STD prevention, self breast exams, smoking cessation and weight bearing exercise. Follow up in: 1 year.    Orders Placed This Encounter  Procedures  . RPR+HBsAg+HCVAb+...    01-05-1988, MD 05/13/2020 8:51 AM

## 2020-05-14 LAB — RPR+HBSAG+HCVAB+...
HIV Screen 4th Generation wRfx: NONREACTIVE
Hep C Virus Ab: <0.1 s/co ratio (ref 0.0–0.9)
Hepatitis B Surface Ag: NEGATIVE
RPR Ser Ql: NONREACTIVE

## 2020-05-16 LAB — CERVICOVAGINAL ANCILLARY ONLY
Bacterial Vaginitis (gardnerella): POSITIVE — AB
Candida Glabrata: NEGATIVE
Candida Vaginitis: NEGATIVE
Chlamydia: NEGATIVE
Comment: NEGATIVE
Comment: NEGATIVE
Comment: NEGATIVE
Comment: NEGATIVE
Comment: NEGATIVE
Comment: NORMAL
Neisseria Gonorrhea: NEGATIVE
Trichomonas: NEGATIVE

## 2020-05-17 LAB — CYTOLOGY - PAP
Comment: NEGATIVE
Diagnosis: NEGATIVE
High risk HPV: NEGATIVE

## 2020-05-19 ENCOUNTER — Other Ambulatory Visit: Payer: Self-pay | Admitting: Obstetrics

## 2020-05-24 DIAGNOSIS — Z319 Encounter for procreative management, unspecified: Secondary | ICD-10-CM | POA: Diagnosis not present

## 2020-06-17 DIAGNOSIS — Z6841 Body Mass Index (BMI) 40.0 and over, adult: Secondary | ICD-10-CM | POA: Diagnosis not present

## 2020-06-17 DIAGNOSIS — I10 Essential (primary) hypertension: Secondary | ICD-10-CM | POA: Diagnosis not present

## 2020-06-17 DIAGNOSIS — E1165 Type 2 diabetes mellitus with hyperglycemia: Secondary | ICD-10-CM | POA: Diagnosis not present

## 2020-06-17 DIAGNOSIS — E668 Other obesity: Secondary | ICD-10-CM | POA: Diagnosis not present

## 2020-06-20 DIAGNOSIS — Z3189 Encounter for other procreative management: Secondary | ICD-10-CM | POA: Diagnosis not present

## 2020-06-22 DIAGNOSIS — Z3189 Encounter for other procreative management: Secondary | ICD-10-CM | POA: Diagnosis not present

## 2020-07-08 DIAGNOSIS — Z3201 Encounter for pregnancy test, result positive: Secondary | ICD-10-CM | POA: Diagnosis not present

## 2020-07-08 DIAGNOSIS — Z3200 Encounter for pregnancy test, result unknown: Secondary | ICD-10-CM | POA: Diagnosis not present

## 2020-07-21 DIAGNOSIS — Z3200 Encounter for pregnancy test, result unknown: Secondary | ICD-10-CM | POA: Diagnosis not present

## 2020-08-04 ENCOUNTER — Telehealth: Payer: Self-pay | Admitting: Obstetrics & Gynecology

## 2020-08-04 ENCOUNTER — Telehealth (INDEPENDENT_AMBULATORY_CARE_PROVIDER_SITE_OTHER): Payer: BC Managed Care – PPO

## 2020-08-04 DIAGNOSIS — O10919 Unspecified pre-existing hypertension complicating pregnancy, unspecified trimester: Secondary | ICD-10-CM

## 2020-08-04 DIAGNOSIS — O0990 Supervision of high risk pregnancy, unspecified, unspecified trimester: Secondary | ICD-10-CM | POA: Insufficient documentation

## 2020-08-04 DIAGNOSIS — I10 Essential (primary) hypertension: Secondary | ICD-10-CM

## 2020-08-04 DIAGNOSIS — Z3A00 Weeks of gestation of pregnancy not specified: Secondary | ICD-10-CM

## 2020-08-04 DIAGNOSIS — O0900 Supervision of pregnancy with history of infertility, unspecified trimester: Secondary | ICD-10-CM | POA: Diagnosis not present

## 2020-08-04 DIAGNOSIS — E119 Type 2 diabetes mellitus without complications: Secondary | ICD-10-CM

## 2020-08-04 NOTE — Progress Notes (Signed)
New OB Intake  I connected with  Joanne Waller on 08/04/20 at  1:15 PM EDT by MyChart Video Visit and verified that I am speaking with the correct person using two identifiers. Nurse is located at [facility] and pt is located at home.  I discussed the limitations, risks, security and privacy concerns of performing an evaluation and management service by telephone and the availability of in person appointments. I also discussed with the patient that there may be a patient responsible charge related to this service. The patient expressed understanding and agreed to proceed.  I explained I am completing New OB Intake today. We discussed her EDD of 03/15/21 that is based on U/S ON 08/04/20 Pt is G3/P1. I reviewed her allergies, medications, Medical/Surgical/OB history, and appropriate screenings. I informed her of [facility] services. Based on history, this is a/an  pregnancy complicated by hypertension .   Patient Active Problem List   Diagnosis Date Noted   Supervision of high risk pregnancy, antepartum 08/04/2020   Diabetes mellitus without complication (HCC)    Hypertension    Closed avulsion fracture of left ankle 08/14/2019    Concerns addressed today  Delivery Plans:  Plans to deliver at [facility].   MyChart/Babyscripts MyChart access verified. I explained pt will have some visits in office and some virtually. Babyscripts instructions given and order placed. Patient verifies receipt of registration text/e-mail. Account successfully created and app downloaded.  Blood Pressure Cuff  Patient has private insurance; instructed to purchase blood pressure cuff and bring to first prenatal appt. Explained after first prenatal appt pt will check weekly and document in Babyscripts.  Weight scale: Patient    have weight scale. Weight scale ordered   Anatomy US Explained first scheduled [place] will be around 19 weeks. Anatomy US scheduled for 10/19/20 at 09:45a. Pt notified to arrive at  09:30a.  Labs Discussed Avelina Laine genetic screening with patient. Would like both Panorama and Horizon drawn at new OB visit. Routine prenatal labs needed.  Covid Vaccine Patient has not covid vaccine.   Mother/ Baby Dyad Candidate?    If yes, offer as possibility  Inform patient of [facility] and place . In AVS   Social Determinants of Health Food Insecurity: Patient denies food insecurity. WIC Referral: Patient is interested in referral to [facility].  Transportation: Patient denies transportation needs. Childcare: Discussed no children allowed at ultrasound appointments. Offered childcare services; patient declines childcare services at this time.  First visit review I reviewed new OB appt with pt. I explained she will have a pelvic exam, ob bloodwork with genetic screening, and PAP smear. Explained pt will be seen by Vonzella Nipple, C-PA at first visit; encounter routed to appropriate provider. Explained that patient will be seen by pregnancy navigator following visit with provider. [facility] information placed in AVS.   Henrietta Dine, CMA 08/04/2020  2:05 PM

## 2020-08-04 NOTE — Telephone Encounter (Signed)
Rc'd call from Babyscripts regarding elevated BP 150/80s, pt asymptomatic.  Tried calling patient- no answer.  Left message for patient to call the office in the am to set up appointment sooner than 7/11 and/or to review her current BP management.

## 2020-08-05 DIAGNOSIS — I10 Essential (primary) hypertension: Secondary | ICD-10-CM | POA: Diagnosis not present

## 2020-08-05 DIAGNOSIS — E1165 Type 2 diabetes mellitus with hyperglycemia: Secondary | ICD-10-CM | POA: Diagnosis not present

## 2020-08-05 DIAGNOSIS — Z331 Pregnant state, incidental: Secondary | ICD-10-CM | POA: Diagnosis not present

## 2020-08-05 DIAGNOSIS — E668 Other obesity: Secondary | ICD-10-CM | POA: Diagnosis not present

## 2020-08-05 DIAGNOSIS — Z6841 Body Mass Index (BMI) 40.0 and over, adult: Secondary | ICD-10-CM | POA: Diagnosis not present

## 2020-08-15 ENCOUNTER — Other Ambulatory Visit: Payer: Self-pay

## 2020-08-15 ENCOUNTER — Encounter: Payer: Self-pay | Admitting: Medical

## 2020-08-15 ENCOUNTER — Ambulatory Visit (INDEPENDENT_AMBULATORY_CARE_PROVIDER_SITE_OTHER): Payer: BC Managed Care – PPO | Admitting: Medical

## 2020-08-15 VITALS — BP 113/79 | HR 101 | Wt 279.8 lb

## 2020-08-15 DIAGNOSIS — Z3A09 9 weeks gestation of pregnancy: Secondary | ICD-10-CM

## 2020-08-15 DIAGNOSIS — I10 Essential (primary) hypertension: Secondary | ICD-10-CM | POA: Insufficient documentation

## 2020-08-15 DIAGNOSIS — O24111 Pre-existing diabetes mellitus, type 2, in pregnancy, first trimester: Secondary | ICD-10-CM

## 2020-08-15 DIAGNOSIS — O10913 Unspecified pre-existing hypertension complicating pregnancy, third trimester: Secondary | ICD-10-CM | POA: Insufficient documentation

## 2020-08-15 DIAGNOSIS — O10019 Pre-existing essential hypertension complicating pregnancy, unspecified trimester: Secondary | ICD-10-CM

## 2020-08-15 DIAGNOSIS — O10919 Unspecified pre-existing hypertension complicating pregnancy, unspecified trimester: Secondary | ICD-10-CM | POA: Insufficient documentation

## 2020-08-15 DIAGNOSIS — O0990 Supervision of high risk pregnancy, unspecified, unspecified trimester: Secondary | ICD-10-CM | POA: Diagnosis not present

## 2020-08-15 DIAGNOSIS — O24119 Pre-existing diabetes mellitus, type 2, in pregnancy, unspecified trimester: Secondary | ICD-10-CM | POA: Insufficient documentation

## 2020-08-15 DIAGNOSIS — O24113 Pre-existing diabetes mellitus, type 2, in pregnancy, third trimester: Secondary | ICD-10-CM | POA: Insufficient documentation

## 2020-08-15 MED ORDER — BLOOD PRESSURE KIT DEVI: 1.0000 | 0 refills | Status: DC

## 2020-08-15 MED ORDER — ASPIRIN EC 81 MG PO TBEC: 81.0000 mg | DELAYED_RELEASE_TABLET | Freq: Every day | ORAL | 11 refills | Status: DC

## 2020-08-15 NOTE — Progress Notes (Signed)
Patient complains of period cramps

## 2020-08-15 NOTE — Patient Instructions (Signed)

## 2020-08-16 LAB — COMPREHENSIVE METABOLIC PANEL
ALT: 15 IU/L (ref 0–32)
AST: 11 IU/L (ref 0–40)
Albumin/Globulin Ratio: 1.4 (ref 1.2–2.2)
Albumin: 3.8 g/dL (ref 3.8–4.8)
Alkaline Phosphatase: 65 IU/L (ref 44–121)
BUN/Creatinine Ratio: 10 (ref 9–23)
BUN: 7 mg/dL (ref 6–20)
Bilirubin Total: <0.2 mg/dL (ref 0.0–1.2)
CO2: 21 mmol/L (ref 20–29)
Calcium: 9.1 mg/dL (ref 8.7–10.2)
Chloride: 97 mmol/L (ref 96–106)
Creatinine, Ser: 0.69 mg/dL (ref 0.57–1.00)
Globulin, Total: 2.8 g/dL (ref 1.5–4.5)
Glucose: 202 mg/dL — ABNORMAL HIGH (ref 65–99)
Potassium: 4.4 mmol/L (ref 3.5–5.2)
Sodium: 134 mmol/L (ref 134–144)
Total Protein: 6.6 g/dL (ref 6.0–8.5)
eGFR: 118 mL/min/{1.73_m2} (ref >59)

## 2020-08-16 LAB — CBC/D/PLT+RPR+RH+ABO+RUBIGG...
Antibody Screen: NEGATIVE
Basophils Absolute: 0.1 10*3/uL (ref 0.0–0.2)
Basos: 1 %
EOS (ABSOLUTE): 0.1 10*3/uL (ref 0.0–0.4)
Eos: 1 %
HCV Ab: <0.1 s/co ratio (ref 0.0–0.9)
HIV Screen 4th Generation wRfx: NONREACTIVE
Hematocrit: 39.3 % (ref 34.0–46.6)
Hemoglobin: 13.4 g/dL (ref 11.1–15.9)
Hepatitis B Surface Ag: NEGATIVE
Immature Grans (Abs): 0 10*3/uL (ref 0.0–0.1)
Immature Granulocytes: 0 %
Lymphocytes Absolute: 3.3 10*3/uL — ABNORMAL HIGH (ref 0.7–3.1)
Lymphs: 36 %
MCH: 27.3 pg (ref 26.6–33.0)
MCHC: 34.1 g/dL (ref 31.5–35.7)
MCV: 80 fL (ref 79–97)
Monocytes Absolute: 0.6 10*3/uL (ref 0.1–0.9)
Monocytes: 6 %
Neutrophils Absolute: 5.1 10*3/uL (ref 1.4–7.0)
Neutrophils: 56 %
Platelets: 341 10*3/uL (ref 150–450)
RBC: 4.91 x10E6/uL (ref 3.77–5.28)
RDW: 12.5 % (ref 11.7–15.4)
RPR Ser Ql: NONREACTIVE
Rh Factor: POSITIVE
Rubella Antibodies, IGG: 2.87 index (ref >0.99)
WBC: 9.1 10*3/uL (ref 3.4–10.8)

## 2020-08-16 LAB — HCV INTERPRETATION

## 2020-08-16 LAB — HEMOGLOBIN A1C
Est. average glucose Bld gHb Est-mCnc: 278 mg/dL
Hgb A1c MFr Bld: 11.3 % — ABNORMAL HIGH (ref 4.8–5.6)

## 2020-08-16 NOTE — Progress Notes (Signed)
PRENATAL VISIT NOTE  Subjective:  Joanne Waller is a 35 y.o. G3P1011 at 101w6dbeing seen today for her first prenatal visit for this pregnancy.  She is currently monitored for the following issues for this high-risk pregnancy and has Closed avulsion fracture of left ankle; Supervision of high risk pregnancy, antepartum; Hypertension; Pre-existing type 2 diabetes mellitus during pregnancy in first trimester; and Pre-existing essential hypertension during pregnancy, antepartum on their problem list.  The patient had 8 week [place] consistent with LMP and expected due date based on date of IUI (5/18)   Patient reports no complaints.  Contractions: Not present. Vag. Bleeding: None.  Movement: Absent. Denies leaking of fluid.   She is planning to both breast and bottlefeed. Desires contraception, unsure method at this time.    The following portions of the patient's history were reviewed and updated as appropriate: allergies, current medications, past family history, past medical history, past social history, past surgical history and problem list.   Objective:   Vitals:   08/15/20 1516  BP: 113/79  Pulse: (!) 101  Weight: 279 lb 12.8 oz (126.9 kg)    Fetal Status: Fetal Heart Rate (bpm): U/S   Movement: Absent     General:  Alert, oriented and cooperative. Patient is in no acute distress.  Skin: Skin is warm and dry. No rash noted.   Cardiovascular: Normal heart rate and rhythm noted  Respiratory: Normal respiratory effort, no problems with respiration noted. Clear to auscultation.   Abdomen: Soft, gravid, appropriate for gestational age. Normal bowel sounds. Non-tender. Pain/Pressure: Present     Pelvic: Cervical exam deferred        Extremities: Normal range of motion.  Edema: Trace  Mental Status: Normal mood and affect. Normal behavior. Normal judgment and thought content.    Pt informed that the ultrasound is considered a limited OB  ultrasound and is not intended to be a complete ultrasound exam.  Patient also informed that the ultrasound is not being completed with the intent of assessing for fetal or placental anomalies or any pelvic abnormalities.  Explained that the purpose of today's ultrasound is to assess for  viability.  Patient acknowledges the purpose of the exam and the limitations of the study. +FM noted on POCUS. +FHR motion noted, no m-mode available, appears normal rate.    Assessment and Plan:  Pregnancy: G3P1011 at 965w6d. Supervision of high risk pregnancy, antepartum - CBC/D/Plt+RPR+Rh+ABO+RubIgG... - Culture, OB Urine - Hemoglobin A1c  2. Pre-existing type 2 diabetes mellitus during pregnancy in first trimester - Advised to start ASA at 12 weeks  - aspirin EC 81 MG tablet; Take 1 tablet (81 mg total) by mouth daily. Swallow whole.  Dispense: 30 tablet; Refill: 11 - Continue current regimen of insulin and Metformin until next appointment - Hgb A1C drawn  - Patient advised she will need fetal echo and eye exam during pregnancy   3. Pre-existing essential hypertension during pregnancy, antepartum  - Advised to start ASA at 12 weeks  - aspirin EC 81 MG tablet; Take 1 tablet (81 mg total) by mouth daily. Swallow whole.  Dispense: 30 tablet; Refill: 11 - Comp Met (CMET) baseline  - Continue Labetalol as previously prescribed, normotensive today.   4. [redacted] weeks gestation of pregnancy  Preterm labor/first trimester warning symptoms and general obstetric precautions including but not limited to vaginal bleeding, contractions, leaking of fluid and fetal movement were reviewed in detail with the patient. Please  refer to After Visit Summary for other counseling recommendations.   Discussed the normal visit cadence for prenatal care Discussed the nature of our practice with multiple providers including residents and students   Return in about 4 weeks (around 09/12/2020) for [facility] MD only, In-Person.  Future  Appointments  Date Time Provider [place]  09/14/2020  3:35 PM Donnamae Jude, MD [facility] [place]  10/19/2020  9:30 AM [facility] NURSE [facility]  10/19/2020  9:45 AM [facility] US4 [facility]    Kerry Hough, PA-C

## 2020-08-17 LAB — CULTURE, OB URINE

## 2020-08-17 LAB — URINE CULTURE, OB REFLEX

## 2020-08-18 ENCOUNTER — Encounter: Payer: Self-pay | Admitting: Medical

## 2020-08-18 ENCOUNTER — Other Ambulatory Visit: Payer: Self-pay

## 2020-08-18 MED ORDER — PROMETHAZINE HCL 25 MG PO TABS: 25.0000 mg | ORAL_TABLET | Freq: Four times a day (QID) | ORAL | 0 refills | Status: DC | PRN

## 2020-08-24 ENCOUNTER — Inpatient Hospital Stay (HOSPITAL_COMMUNITY)
Admission: AD | Admit: 2020-08-24 | Discharge: 2020-08-24 | Disposition: A | Discharge status: Home / Self Care | Payer: BC Managed Care – PPO | Attending: Obstetrics & Gynecology | Admitting: Obstetrics & Gynecology

## 2020-08-24 ENCOUNTER — Other Ambulatory Visit: Payer: Self-pay

## 2020-08-24 ENCOUNTER — Encounter (HOSPITAL_COMMUNITY): Payer: Self-pay | Admitting: Obstetrics & Gynecology

## 2020-08-24 DIAGNOSIS — Z3A11 11 weeks gestation of pregnancy: Secondary | ICD-10-CM

## 2020-08-24 DIAGNOSIS — O24111 Pre-existing diabetes mellitus, type 2, in pregnancy, first trimester: Secondary | ICD-10-CM | POA: Diagnosis not present

## 2020-08-24 DIAGNOSIS — R102 Pelvic and perineal pain: Secondary | ICD-10-CM

## 2020-08-24 DIAGNOSIS — R109 Unspecified abdominal pain: Secondary | ICD-10-CM | POA: Diagnosis not present

## 2020-08-24 DIAGNOSIS — Z79899 Other long term (current) drug therapy: Secondary | ICD-10-CM | POA: Diagnosis not present

## 2020-08-24 DIAGNOSIS — Z791 Long term (current) use of non-steroidal anti-inflammatories (NSAID): Secondary | ICD-10-CM | POA: Diagnosis not present

## 2020-08-24 DIAGNOSIS — Z880 Allergy status to penicillin: Secondary | ICD-10-CM | POA: Diagnosis not present

## 2020-08-24 DIAGNOSIS — F17210 Nicotine dependence, cigarettes, uncomplicated: Secondary | ICD-10-CM | POA: Diagnosis not present

## 2020-08-24 DIAGNOSIS — Z794 Long term (current) use of insulin: Secondary | ICD-10-CM | POA: Insufficient documentation

## 2020-08-24 DIAGNOSIS — Z7982 Long term (current) use of aspirin: Secondary | ICD-10-CM | POA: Insufficient documentation

## 2020-08-24 DIAGNOSIS — O99331 Smoking (tobacco) complicating pregnancy, first trimester: Secondary | ICD-10-CM | POA: Diagnosis not present

## 2020-08-24 DIAGNOSIS — O10911 Unspecified pre-existing hypertension complicating pregnancy, first trimester: Secondary | ICD-10-CM | POA: Diagnosis not present

## 2020-08-24 DIAGNOSIS — O26899 Other specified pregnancy related conditions, unspecified trimester: Secondary | ICD-10-CM

## 2020-08-24 DIAGNOSIS — O368310 Maternal care for abnormalities of the fetal heart rate or rhythm, first trimester, not applicable or unspecified: Secondary | ICD-10-CM

## 2020-08-24 DIAGNOSIS — O26891 Other specified pregnancy related conditions, first trimester: Secondary | ICD-10-CM

## 2020-08-24 DIAGNOSIS — O368390 Maternal care for abnormalities of the fetal heart rate or rhythm, unspecified trimester, not applicable or unspecified: Secondary | ICD-10-CM

## 2020-08-24 LAB — URINALYSIS, ROUTINE W REFLEX MICROSCOPIC
Bilirubin Urine: NEGATIVE
Glucose, UA: NEGATIVE mg/dL
Hgb urine dipstick: NEGATIVE
Ketones, ur: NEGATIVE mg/dL
Leukocytes,Ua: NEGATIVE
Nitrite: NEGATIVE
Protein, ur: NEGATIVE mg/dL
Specific Gravity, Urine: >1.03 — ABNORMAL HIGH (ref 1.005–1.030)
pH: 6 (ref 5.0–8.0)

## 2020-08-24 NOTE — Discharge Instructions (Signed)
Return to care  If you have heavier bleeding that soaks through more than 2 pads per hour for an hour or more If you bleed so much that you feel like you might pass out or you do pass out If you have significant abdominal pain that is not improved with Tylenol   

## 2020-08-24 NOTE — MAU Note (Signed)
Presents with /co pelvic pain and pressure that began yesterday but has worsened throughout the day.  Denies VB.  Hasn't taken any meds for the discomfort.

## 2020-08-24 NOTE — MAU Provider Note (Signed)
History     CSN: [id number]  Arrival date and time: 08/24/20 1329   Event Date/Time   First Provider Initiated Contact with Patient 08/24/20 1453      Chief Complaint  Patient presents with   Pelvic Pain   Pelvic Pressure   HPI Joanne Waller is a 34 y.o. G3P1011 at [redacted]w[redacted]d who presents with pelvic pain. Symptoms started yesterday. Reports intermittent abdominal cramping & pelvic pain. Denies n/v/d, constipation, dysuria, vaginal bleeding, or abnormal vaginal discharge. Hasn't treated symptoms.   OB History     Gravida  3   Para  1   Term  1   Preterm      AB  1   Living  1      SAB  1   IAB      Ectopic      Multiple      Live Births  1           Past Medical History:  Diagnosis Date   Diabetes mellitus without complication (HCC)    Hypertension    Pyelonephritis    Renal disorder    pyelonephritis   Seizures (HCC)     Past Surgical History:  Procedure Laterality Date   CHOLECYSTECTOMY      Family History  Problem Relation Age of Onset   Kidney disease Mother    Hypertension Mother    Sickle cell anemia Father    Hyperlipidemia Maternal Grandmother     Social History   Tobacco Use   Smoking status: Every Day    Packs/day: 0.50    Years: 7.00    Pack years: 3.50    Types: Cigarettes   Smokeless tobacco: Never  Vaping Use   Vaping Use: Never used  Substance Use Topics   Alcohol use: Yes    Comment: occ   Drug use: No    Allergies:  Allergies  Allergen Reactions   Amoxicillin Other (See Comments)    Pt stated, "I get a severe blinding headache"   Penicillins Other (See Comments)    Sharp pain in temple    Latex Itching, Swelling and Rash    Medications Prior to Admission  Medication Sig Dispense Refill Last Dose   labetalol (NORMODYNE) 100 MG tablet Take 100 mg by mouth 2 (two) times daily.   Past Week   LANTUS SOLOSTAR 100 UNIT/ML Solostar Pen SMARTSIG:40 Unit(s) SUB-Q Every Evening   08/23/2020   metFORMIN  (GLUCOPHAGE) 500 MG tablet Take by mouth 2 (two) times daily with a meal.   08/23/2020   ACCU-CHEK GUIDE test strip  (Patient not taking: Reported on 08/15/2020)      Accu-Chek Softclix Lancets lancets daily. (Patient not taking: Reported on 08/15/2020)      acetaminophen (TYLENOL) 325 MG tablet Take 2 tablets (650 mg total) by mouth every 6 (six) hours as needed. (Patient not taking: Reported on 08/15/2020) 30 tablet 0    aspirin EC 81 MG tablet Take 1 tablet (81 mg total) by mouth daily. Swallow whole. 30 tablet 11    B-D ULTRAFINE III SHORT PEN 31G X 8 MM MISC daily. (Patient not taking: Reported on 08/15/2020)      Blood Glucose Monitoring Suppl (ACCU-CHEK GUIDE) w/Device KIT FOR DAILY GLUCOSE TESTING (Patient not taking: Reported on 08/15/2020)      Blood Pressure Monitoring (BLOOD PRESSURE KIT) DEVI 1 each by Does not apply route once a week. (Patient not taking: Reported on 08/15/2020) 1 each 0      brompheniramine-pseudoephedrine-DM 30-2-10 MG/5ML syrup Take 5 mLs by mouth 4 (four) times daily as needed. (Patient not taking: Reported on 08/15/2020) 120 mL 0    cabergoline (DOSTINEX) 0.5 MG tablet Take 0.5 mg by mouth 2 (two) times a week. (Patient not taking: Reported on 08/15/2020)      cephALEXin (KEFLEX) 250 MG capsule Take 250 mg by mouth 3 (three) times daily. (Patient not taking: Reported on 08/15/2020)      emtricitabine-tenofovir (TRUVADA) 200-300 MG tablet Take 1 tablet by mouth daily. (Patient not taking: Reported on 08/15/2020)      fluticasone (FLONASE) 50 MCG/ACT nasal spray Place 2 sprays into both nostrils daily. 16 g 0    ibuprofen (ADVIL) 600 MG tablet Take 1 tablet (600 mg total) by mouth every 6 (six) hours as needed. (Patient not taking: No sig reported) 30 tablet 0    insulin glargine, 2 Unit Dial, (TOUJEO MAX SOLOSTAR) 300 UNIT/ML Solostar Pen  (Patient not taking: No sig reported)      JANUVIA 100 MG tablet Take 100 mg by mouth daily. (Patient not taking: No sig reported)       letrozole (FEMARA) 2.5 MG tablet Take by mouth.      losartan (COZAAR) 25 MG tablet  (Patient not taking: No sig reported)  5    LOSARTAN POTASSIUM PO Take by mouth.      metroNIDAZOLE (FLAGYL) 500 MG tablet Take 1 tablet (500 mg total) by mouth 2 (two) times daily. (Patient not taking: Reported on 08/15/2020) 14 tablet 0    metroNIDAZOLE (FLAGYL) 500 MG tablet Take 1 tablet (500 mg total) by mouth 2 (two) times daily. (Patient not taking: Reported on 08/15/2020) 14 tablet 0    metroNIDAZOLE (FLAGYL) 500 MG tablet Take 1 tablet (500 mg total) by mouth 2 (two) times daily. (Patient not taking: Reported on 08/15/2020) 14 tablet 2    OZEMPIC, 0.25 OR 0.5 MG/DOSE, 2 MG/1.5ML SOPN Inject 0.5 mg into the skin once a week.      phentermine (ADIPEX-P) 37.5 MG tablet Take 37.5 mg by mouth every morning. (Patient not taking: Reported on 08/15/2020)      promethazine (PHENERGAN) 25 MG tablet Take 1 tablet (25 mg total) by mouth every 6 (six) hours as needed for nausea or vomiting. 30 tablet 0    TOUJEO SOLOSTAR 300 UNIT/ML Solostar Pen SMARTSIG:30 Unit(s) SUB-Q Every Evening (Patient not taking: Reported on 08/15/2020)       Review of Systems  Constitutional: Negative.   Gastrointestinal:  Positive for abdominal pain. Negative for constipation, diarrhea, nausea and vomiting.  Genitourinary:  Positive for pelvic pain. Negative for dysuria, vaginal bleeding and vaginal discharge.  Physical Exam   Blood pressure 114/77, pulse (!) 104, temperature 98 F (36.7 C), temperature source Oral, resp. rate 19, height 5' 10" (1.778 m), weight 126.4 kg, last menstrual period 06/09/2020, SpO2 97 %.  Physical Exam Vitals and nursing note reviewed. Exam conducted with a chaperone present.  Constitutional:      General: She is not in acute distress.    Appearance: Normal appearance.  HENT:     Head: Normocephalic and atraumatic.  Eyes:     General: No scleral icterus. Pulmonary:     Effort: Pulmonary effort is normal.  No respiratory distress.  Abdominal:     Palpations: Abdomen is soft.     Tenderness: There is no abdominal tenderness.  Genitourinary:    Comments: Cervix closed/thick Skin:    General: Skin is warm and dry.  Neurological:     Mental Status: She is alert.  Psychiatric:        Mood and Affect: Mood normal.        Behavior: Behavior normal.    MAU Course  Procedures Results for orders placed or performed during the hospital encounter of 08/24/20 (from the past 24 hour(s))  Urinalysis, Routine w reflex microscopic Urine, Clean Catch     Status: Abnormal   Collection Time: 08/24/20  2:25 PM  Result Value Ref Range   Color, Urine YELLOW YELLOW   APPearance HAZY (A) CLEAR   Specific Gravity, Urine >1.030 (H) 1.005 - 1.030   pH 6.0 5.0 - 8.0   Glucose, UA NEGATIVE NEGATIVE mg/dL   Hgb urine dipstick NEGATIVE NEGATIVE   Bilirubin Urine NEGATIVE NEGATIVE   Ketones, ur NEGATIVE NEGATIVE mg/dL   Protein, ur NEGATIVE NEGATIVE mg/dL   Nitrite NEGATIVE NEGATIVE   Leukocytes,Ua NEGATIVE NEGATIVE    MDM RN unable to doppler FHT. BSUS performed and showed live IUP.   Benign abdominal exam, cervix closed, & negative u/a.   Pt informed that the ultrasound is considered a limited OB ultrasound and is not intended to be a complete ultrasound exam.  Patient also informed that the ultrasound is not being completed with the intent of assessing for fetal or placental anomalies or any pelvic abnormalities.  Explained that the purpose of today's ultrasound is to assess for  viability.  Patient acknowledges the purpose of the exam and the limitations of the study.  Active fetus with FHR 164 bpm   Assessment and Plan   1. Abdominal cramping affecting pregnancy   2. Ultrasound scan done for inability to hear fetal heart tones   3. [redacted] weeks gestation of pregnancy    -reviewed reasons to return to MAU -keep f/u with OB  Erin Lawrence 08/24/2020, 2:53 PM  

## 2020-09-12 ENCOUNTER — Inpatient Hospital Stay (HOSPITAL_COMMUNITY)
Admission: AD | Admit: 2020-09-12 | Discharge: 2020-09-12 | Disposition: A | Discharge status: Home / Self Care | Payer: BC Managed Care – PPO | Attending: Obstetrics & Gynecology | Admitting: Obstetrics & Gynecology

## 2020-09-12 DIAGNOSIS — O26891 Other specified pregnancy related conditions, first trimester: Secondary | ICD-10-CM | POA: Diagnosis not present

## 2020-09-12 DIAGNOSIS — Z3A13 13 weeks gestation of pregnancy: Secondary | ICD-10-CM

## 2020-09-12 DIAGNOSIS — R110 Nausea: Secondary | ICD-10-CM | POA: Diagnosis not present

## 2020-09-12 DIAGNOSIS — O10911 Unspecified pre-existing hypertension complicating pregnancy, first trimester: Secondary | ICD-10-CM | POA: Diagnosis not present

## 2020-09-12 DIAGNOSIS — O10919 Unspecified pre-existing hypertension complicating pregnancy, unspecified trimester: Secondary | ICD-10-CM

## 2020-09-12 NOTE — MAU Provider Note (Signed)
Chief Complaint:  Hypertension   Event Date/Time   First Provider Initiated Contact with Patient 09/12/20 1321     HPI: Joanne Waller is a 35 y.o. G3P1011 at 40w5dwho presents to maternity admissions reporting high blood pressure. She reports her BP was 131/102. Patient reports a history of cHTN, takes labetalol daily, however has not taken in 3 days as she has not picked up her BP meds. She also reports that she had a headache earlier, but has improved, rates 3/10. She takes Tylenol prn, but has not taken any since yesterday. Has not had anything to eat recently, but also has nausea. Has not taken her promethazine as it makes her sleepy and she has to work. She otherwise denies chest pain, SOB, vision changes, or other neurological symptoms.  Pregnancy Course:   Past Medical History:  Diagnosis Date   Diabetes mellitus without complication ([place]    Hypertension    Pyelonephritis    Renal disorder    pyelonephritis   Seizures ([place]    OB History  Gravida Para Term Preterm AB Living  _0 SAB IAB Ectopic Multiple Live Births  1       1    # Outcome Date GA Lbr Len/2nd Weight Sex Delivery Anes PTL Lv  3 Current           2 SAB 10/06/08        DEC  1 Term 03/17/04    M Vag-Spont   LIV   Past Surgical History:  Procedure Laterality Date   CHOLECYSTECTOMY     Family History  Problem Relation Age of Onset   Kidney disease Mother    Hypertension Mother    Sickle cell anemia Father    Hyperlipidemia Maternal Grandmother    Social History   Tobacco Use   Smoking status: Every Day    Packs/day: 0.50    Years: 7.00    Pack years: 3.50    Types: Cigarettes   Smokeless tobacco: Never  Vaping Use   Vaping Use: Never used  Substance Use Topics   Alcohol use: Yes    Comment: occ   Drug use: No   Allergies  Allergen Reactions   Amoxicillin Other (See Comments)    Pt stated, "I get a severe blinding headache"   Penicillins Other (See Comments)    Sharp pain in  temple    Latex Itching, Swelling and Rash   No medications prior to admission.    I have reviewed patient's Past Medical Hx, Surgical Hx, Family Hx, Social Hx, medications and allergies.   ROS:  Review of Systems  Constitutional: Negative.   Respiratory: Negative.    Cardiovascular: Negative.   Gastrointestinal:  Positive for nausea. Negative for diarrhea and vomiting.  Genitourinary: Negative.   Musculoskeletal: Negative.   Neurological:  Positive for headaches. Negative for dizziness and syncope.   Physical Exam  Patient Vitals for the past 24 hrs:  BP Temp Pulse Resp Height Weight  09/12/20 1215 121/84 98.7 F (37.1 C) (!) 105 18 _1  (1.778 m) 127 kg   Constitutional: well-developed, well-nourished female in no acute distress.  Cardiovascular: normal rate Respiratory: normal effort GI: abd soft, non-tender MS: Extremities nontender, no edema, normal ROM Neurologic: Alert and oriented x 4, no neurological deficits  FHT: 152 bpm via doppler    Labs: No results found for this or any previous visit (from the past 24 hour(s)).  Imaging:  No  results found.  MAU Course: Orders Placed This Encounter  Procedures   Urinalysis, Routine w reflex microscopic Urine, Clean Catch   Discharge patient   No orders of the defined types were placed in this encounter.   MDM: 56w5dcHTN, BP normotensive here, no neurological deficits FHR 152bpm Encouraged patient to pick up BP medication when she leaves today Nausea but does not take medication because it makes her sleepy. Offered to change medication or recommend taking 1/2 tablet of phenergan. Patient opts to try 1/2 tablet at bedtime Patient wishes to return to work today Recommend eating and drinking regularly given T2DM and nausea. Also take Tylenol on schedule Reviewed strict return precautions   Assessment: 1. Chronic hypertension affecting pregnancy   2. Nausea     Plan: Discharge home in stable  condition Pick up prescription today  Keep appointment as scheduled on 09/14/2020 Return to MAU as needed    [place] [facility].   Specialty: Obstetrics and Gynecology Why: as scheduled on 09/14/2020. Return to MAU as needed. Contact information: [address] [place] [id number] [phone]               Allergies as of 09/12/2020       Reactions   Amoxicillin Other (See Comments)   Pt stated, "I get a severe blinding headache"   Penicillins Other (See Comments)   Sharp pain in temple    Latex Itching, Swelling, Rash        Medication List     TAKE these medications    Accu-Chek Guide test strip Generic drug: glucose blood   Accu-Chek Guide w/Device Kit FOR DAILY GLUCOSE TESTING   Accu-Chek Softclix Lancets lancets daily.   acetaminophen 325 MG tablet Commonly known as: Tylenol Take 2 tablets (650 mg total) by mouth every 6 (six) hours as needed.   aspirin EC 81 MG tablet Take 1 tablet (81 mg total) by mouth daily. Swallow whole.   B-D ULTRAFINE III SHORT PEN 31G X 8 MM Misc Generic drug: Insulin Pen Needle daily.   Blood Pressure Kit Devi 1 each by Does not apply route once a week.   labetalol 100 MG tablet Commonly known as: NORMODYNE Take 100 mg by mouth 2 (two) times daily.   Lantus SoloStar 100 UNIT/ML Solostar Pen Generic drug: insulin glargine SMARTSIG:40 Unit(s) SUB-Q Every Evening   metFORMIN 500 MG tablet Commonly known as: GLUCOPHAGE Take by mouth 2 (two) times daily with a meal.   promethazine 25 MG tablet Commonly known as: PHENERGAN Take 1 tablet (25 mg total) by mouth every 6 (six) hours as needed for nausea or vomiting.         DMaryagnes Amos MSN, CNM 09/12/2020 2:03 PM

## 2020-09-12 NOTE — MAU Note (Addendum)
Pt reports increased b/p. Chronic Hypertension.Stated she has been off her medication had not picked up refill. 130/102. C/O headache all weekend.Taking tylenol without relief. Reports some floaters. And some n/v as well over the weekend( has had morning sickness )

## 2020-09-14 ENCOUNTER — Ambulatory Visit (INDEPENDENT_AMBULATORY_CARE_PROVIDER_SITE_OTHER): Payer: BC Managed Care – PPO | Admitting: Family Medicine

## 2020-09-14 ENCOUNTER — Other Ambulatory Visit: Payer: Self-pay

## 2020-09-14 VITALS — BP 113/69 | HR 112 | Wt 278.6 lb

## 2020-09-14 DIAGNOSIS — Z315 Encounter for genetic counseling: Secondary | ICD-10-CM | POA: Diagnosis not present

## 2020-09-14 DIAGNOSIS — O24119 Pre-existing diabetes mellitus, type 2, in pregnancy, unspecified trimester: Secondary | ICD-10-CM | POA: Diagnosis not present

## 2020-09-14 DIAGNOSIS — O10019 Pre-existing essential hypertension complicating pregnancy, unspecified trimester: Secondary | ICD-10-CM

## 2020-09-14 DIAGNOSIS — O09892 Supervision of other high risk pregnancies, second trimester: Secondary | ICD-10-CM | POA: Diagnosis not present

## 2020-09-14 DIAGNOSIS — O0990 Supervision of high risk pregnancy, unspecified, unspecified trimester: Secondary | ICD-10-CM

## 2020-09-14 MED ORDER — INSULIN ASPART 100 UNIT/ML IJ SOLN: 20.0000 [IU] | Freq: Three times a day (TID) | INTRAMUSCULAR | 12 refills | Status: DC

## 2020-09-14 NOTE — Patient Instructions (Addendum)
Following an appropriate diet and keeping your blood sugar under control is the most important thing to do for your health and that of your unborn baby.  Please check your blood sugar 4 times daily.  Please keep accurate BS logs and bring them with you to every visit.  Please bring your meter also.  Goals for Blood sugar should be: 1. Fasting (first thing in the morning before eating) should be less than 90.   2.  2 hours after meals should be less than 120.  Please eat 3 meals and 3 snacks.  Include protein (meat, dairy-cheese, eggs, nuts) with all meals.  Be mindful that carbohydrates increase your blood sugar.  Not just sweet food (cookies, cake, donuts, fruit, juice, soda) but also bread, pasta, rice, and potatoes.  You have to limit how many carbs you are eating.  Adding exercise, as little as 30 minutes a day can decrease your blood sugar.   Second Trimester of Pregnancy The second trimester of pregnancy is from week 13 through week 27. This is months 4 through 6 of pregnancy. The second trimester is often a time when you feel your best. Your body has adjusted to being pregnant, and you begin to feel better physically. During the second trimester: Morning sickness has lessened or stopped completely. You may have more energy. You may have an increase in appetite. The second trimester is also a time when the unborn baby (fetus) is growing rapidly. At the end of the sixth month, the fetus may be up to 12 inches long and weigh about 1 pounds. You will likely begin to feel the baby move (quickening) between 16 and 20 weeks of pregnancy. Body changes during your second trimester Your body continues to go through many changes during your second trimester. The changes vary and generally return to normal after the baby is born. Physical changes Your weight will continue to increase. You will notice your lower abdomen bulging out. You may begin to get stretch marks on your hips, abdomen, and  breasts. Your breasts will continue to grow and to become tender. Dark spots or blotches (chloasma or mask of pregnancy) may develop on your face. A dark line from your belly button to the pubic area (linea nigra) may appear. You may have changes in your hair. These can include thickening of your hair, rapid growth, and changes in texture. Some people also have hair loss during or after pregnancy, or hair that feels dry or thin. Health changes You may develop headaches. You may have heartburn. You may develop constipation. You may develop hemorrhoids or swollen, bulging veins (varicose veins). Your gums may bleed and may be sensitive to brushing and flossing. You may urinate more often because the fetus is pressing on your bladder. You may have back pain. This is caused by: Weight gain. Pregnancy hormones that are relaxing the joints in your pelvis. A shift in weight and the muscles that support your balance. Follow these instructions at home: Medicines Follow your health care provider's instructions regarding medicine use. Specific medicines may be either safe or unsafe to take during pregnancy. Do not take any medicines unless approved by your health care provider. Take a prenatal vitamin that contains at least 600 micrograms (mcg) of folic acid. Eating and drinking Eat a healthy diet that includes fresh fruits and vegetables, whole grains, good sources of protein such as meat, eggs, or tofu, and low-fat dairy products. Avoid raw meat and unpasteurized juice, milk, and cheese. These carry   germs that can harm you and your baby. You may need to take these actions to prevent or treat constipation: Drink enough fluid to keep your urine pale yellow. Eat foods that are high in fiber, such as beans, whole grains, and fresh fruits and vegetables. Limit foods that are high in fat and processed sugars, such as fried or sweet foods. Activity Exercise only as directed by your health care provider.  Most people can continue their usual exercise routine during pregnancy. Try to exercise for 30 minutes at least 5 days a week. Stop exercising if you develop contractions in your uterus. Stop exercising if you develop pain or cramping in the lower abdomen or lower back. Avoid exercising if it is very hot or humid or if you are at a high altitude. Avoid heavy lifting. If you choose to, you may have sex unless your health care provider tells you not to. Relieving pain and discomfort Wear a supportive bra to prevent discomfort from breast tenderness. Take warm sitz baths to soothe any pain or discomfort caused by hemorrhoids. Use hemorrhoid cream if your health care provider approves. Rest with your legs raised (elevated) if you have leg cramps or low back pain. If you develop varicose veins: Wear support hose as told by your health care provider. Elevate your feet for 15 minutes, 3-4 times a day. Limit salt in your diet. Safety Wear your seat belt at all times when driving or riding in a car. Talk with your health care provider if someone is verbally or physically abusive to you. Lifestyle Do not use hot tubs, steam rooms, or saunas. Do not douche. Do not use tampons or scented sanitary pads. Avoid cat litter boxes and soil used by cats. These carry germs that can cause birth defects in the baby and possibly loss of the fetus by miscarriage or stillbirth. Do not use herbal remedies, alcohol, illegal drugs, or medicines that are not approved by your health care provider. Chemicals in these products can harm your baby. Do not use any products that contain nicotine or tobacco, such as cigarettes, e-cigarettes, and chewing tobacco. If you need help quitting, ask your health care provider. General instructions During a routine prenatal visit, your health care provider will do a physical exam and other tests. He or she will also discuss your overall health. Keep all follow-up visits. This is  important. Ask your health care provider for a referral to a local prenatal education class. Ask for help if you have counseling or nutritional needs during pregnancy. Your health care provider can offer advice or refer you to specialists for help with various needs. Where to find more information American Pregnancy Association: americanpregnancy.org American College of Obstetricians and Gynecologists: acog.org/en/Womens%20Health/Pregnancy Office on Women's Health: womenshealth.gov/pregnancy Contact a health care provider if you have: A headache that does not go away when you take medicine. Vision changes or you see spots in front of your eyes. Mild pelvic cramps, pelvic pressure, or nagging pain in the abdominal area. Persistent nausea, vomiting, or diarrhea. A bad-smelling vaginal discharge or foul-smelling urine. Pain when you urinate. Sudden or extreme swelling of your face, hands, ankles, feet, or legs. A fever. Get help right away if you: Have fluid leaking from your vagina. Have spotting or bleeding from your vagina. Have severe abdominal cramping or pain. Have difficulty breathing. Have chest pain. Have fainting spells. Have not felt your baby move for the time period told by your health care provider. Have new or increased pain, swelling,   or redness in an arm or leg. Summary The second trimester of pregnancy is from week 13 through week 27 (months 4 through 6). Do not use herbal remedies, alcohol, illegal drugs, or medicines that are not approved by your health care provider. Chemicals in these products can harm your baby. Exercise only as directed by your health care provider. Most people can continue their usual exercise routine during pregnancy. Keep all follow-up visits. This is important. This information is not intended to replace advice given to you by your health care provider. Make sure you discuss any questions you have with your health care provider. Document  Revised: 07/01/2019 Document Reviewed: 05/07/2019 Elsevier Patient Education  2022 Elsevier Inc.  

## 2020-09-14 NOTE — Progress Notes (Signed)
   PRENATAL VISIT NOTE  Subjective:  Joanne Waller is a 35 y.o. G3P1011 at [redacted]w[redacted]d being seen today for ongoing prenatal care.  She is currently monitored for the following issues for this high-risk pregnancy and has Closed avulsion fracture of left ankle; Supervision of high risk pregnancy, antepartum; Hypertension; Pre-existing type 2 diabetes affecting pregnancy, antepartum; and Pre-existing essential hypertension during pregnancy, antepartum on their problem list.  Patient reports nausea and vomiting.  Contractions: Not present. Vag. Bleeding: None.   . Denies leaking of fluid.   The following portions of the patient's history were reviewed and updated as appropriate: allergies, current medications, past family history, past medical history, past social history, past surgical history and problem list.   Objective:   Vitals:   09/14/20 1600  BP: 113/69  Pulse: (!) 112  Weight: 278 lb 9.6 oz (126.4 kg)    Fetal Status:           General:  Alert, oriented and cooperative. Patient is in no acute distress.  Skin: Skin is warm and dry. No rash noted.   Cardiovascular: Normal heart rate noted  Respiratory: Normal respiratory effort, no problems with respiration noted  Abdomen: Soft, gravid, appropriate for gestational age.  Pain/Pressure: Present     Pelvic: Cervical exam deferred        Extremities: Normal range of motion.  Edema: Trace  Mental Status: Normal mood and affect. Normal behavior. Normal judgment and thought content.   Assessment and Plan:  Pregnancy: G3P1011 at [redacted]w[redacted]d 1. Pre-existing essential hypertension during pregnancy, antepartum Baseline labs WNL On Labetalol and BP is well controlled  - Protein / creatinine ratio, urine  2. Pre-existing type 2 diabetes affecting pregnancy, antepartum Reports not checking CBGs unless feels bad--checked last week and 178. A1C is 11.3 on Lantus and Metformin. Added weight based short acting.  Reviewed diet, log blood sugar 4 times  daily. Bring them with BS log to every visit. Reviewed Goals of CBG, 3 meals and 3 snacks, include protein (meat, dairy-cheese, eggs, nuts) with all meals. Adding exercise, as little as 30 minutes a day Risks of poor CBG control at length with patient and her mother - Ambulatory referral to Pediatric Cardiology - Ambulatory referral to Ophthalmology - insulin aspart (NOVOLOG) 100 UNIT/ML injection; Inject 20 Units into the skin 3 (three) times daily with meals.  Dispense: 10 mL; Refill: 12 - Protein / creatinine ratio, urine  3. Supervision of high risk pregnancy, antepartum - Genetic Screening  General obstetric precautions including but not limited to vaginal bleeding, contractions, leaking of fluid and fetal movement were reviewed in detail with the patient. Please refer to After Visit Summary for other counseling recommendations.   Return in 2 weeks (on 09/28/2020) for diabetes educator.  Future Appointments  Date Time Provider Department Center  10/19/2020  9:30 AM [facility] NURSE [facility] [place]  10/19/2020  9:45 AM [facility]    Reva Bores, MD

## 2020-09-15 LAB — PROTEIN / CREATININE RATIO, URINE
Creatinine, Urine: 346.5 mg/dL
Protein, Ur: 33.7 mg/dL
Protein/Creat Ratio: 97 mg/g creat (ref 0–200)

## 2020-09-19 NOTE — Addendum Note (Signed)
Addended by: Aviva Signs on: 09/19/2020 09:42 AM   Modules accepted: Orders

## 2020-09-20 NOTE — Progress Notes (Unsigned)
Call placed, identified patient with name and DOB. Informed patient that ophthalmology appointment had been scheduled with [facility] at [facility]. Address and phone number given to patient. Appointment is scheduled for Tuesday, 12/20/20 at 12:30pm.  Informed patient that [facility] would be in contact with her to schedule fetal echo. Verbalizes understanding. Encouraged to call our office with any questions or concerns.   Wynona Canes, CMA

## 2020-09-27 ENCOUNTER — Ambulatory Visit: Payer: BC Managed Care – PPO | Admitting: Registered"

## 2020-09-27 ENCOUNTER — Encounter: Payer: BC Managed Care – PPO | Attending: Family Medicine | Admitting: Registered"

## 2020-09-27 ENCOUNTER — Other Ambulatory Visit: Payer: Self-pay

## 2020-09-27 DIAGNOSIS — O24119 Pre-existing diabetes mellitus, type 2, in pregnancy, unspecified trimester: Secondary | ICD-10-CM | POA: Diagnosis not present

## 2020-09-27 DIAGNOSIS — E118 Type 2 diabetes mellitus with unspecified complications: Secondary | ICD-10-CM | POA: Insufficient documentation

## 2020-09-27 DIAGNOSIS — Z3A00 Weeks of gestation of pregnancy not specified: Secondary | ICD-10-CM | POA: Insufficient documentation

## 2020-09-27 NOTE — Progress Notes (Signed)
Patient was seen for Pre-existing Type 2 Diabetes in pregnancy self-management on 09/27/20  Start time 1415 and End time 1515   Estimated due date: 03/15/21; [redacted]w[redacted]d  Clinical: Medications: Lantus 45 units qhs, Novolog 20 units with meals Medical History: Type 2 Diabetes diagnosis 2014 Labs: A1c 11.3% 08/15/20  Dietary and Lifestyle History: Patient reports she started metformin 500 bid when diagnosed in 2014 and not given much direction for management at that time. Pt reports when she started insulin she stopped metformin. Although Rx for Lantus is 45 units tid, patient states she only takes qhs.  Patient reports her fasting blood sugar tends to be high but then comes down during the day. Patient reports that she was told to eat a snack at night. Patient states that she will snack in the evening while watching TV on things such as chips, fruit, etc. Pt reports sometimes not hungry in the afternoon and skips lunch.  Physical Activity: not assessed Stress: not asssessed Sleep: not asssessed  24 hr Recall: First Meal: chicken fajita omelette, 2 slices whole wheat toast, avocado Snack: Second meal: teriyaki chicken, salad Snack: fruit Third meal: fish, greens Snack: lollipop, chips, or fruit  Beverages: 3-4 bottles water, sugar-free soda, sugar-free juice (Twist it)  NUTRITION INTERVENTION  Nutrition education (E-1) on the following topics:   Initial Follow-up  [x]  []  Definition of Gestational Diabetes vs T2DM in pregnancy [x]  []  Why dietary management is important in controlling blood glucose [x]  []  Effects each nutrient has on blood glucose levels [x]  []  Simple carbohydrates vs complex carbohydrates [x]  []  Fluid intake [x]  []  Creating a balanced meal plan [x]  []  Carbohydrate counting  [x]  []  When to check blood glucose levels [x]  []  Proper blood glucose monitoring techniques [x]  []  Effect of stress and stress reduction techniques  [x]  []  Exercise effect on blood glucose levels,  appropriate exercise during pregnancy [x]  []  Importance of limiting caffeine and abstaining from alcohol and smoking [x]  []  Medications used for blood sugar control during pregnancy [x]  []  Hypoglycemia and rule of 15 [x]  []  Postpartum self care  Patient already has a meter, is testing pre breakfast and 2 hours after each meal. Patient reported values: FBS: 155 mg/dL Postprandial: mg/dL  Patient instructed to monitor glucose levels: FBS: 70 - ? 95 mg/dL (some clinics use 90 for cutoff) 1 hour: ? 140 mg/dL 2 hour: ? mg/dL  Patient received handouts: Nutrition Diabetes and Pregnancy Carbohydrate Counting List  Patient will be seen for follow-up this week to start sample DexcomG6 CGM.

## 2020-09-29 ENCOUNTER — Other Ambulatory Visit: Payer: Self-pay

## 2020-09-29 ENCOUNTER — Other Ambulatory Visit: Payer: Self-pay | Admitting: Lactation Services

## 2020-09-29 ENCOUNTER — Ambulatory Visit: Payer: BC Managed Care – PPO | Admitting: Registered"

## 2020-09-29 ENCOUNTER — Encounter: Payer: BC Managed Care – PPO | Admitting: Registered"

## 2020-09-29 DIAGNOSIS — E118 Type 2 diabetes mellitus with unspecified complications: Secondary | ICD-10-CM | POA: Diagnosis not present

## 2020-09-29 DIAGNOSIS — O24119 Pre-existing diabetes mellitus, type 2, in pregnancy, unspecified trimester: Secondary | ICD-10-CM | POA: Diagnosis not present

## 2020-09-29 DIAGNOSIS — Z3A00 Weeks of gestation of pregnancy not specified: Secondary | ICD-10-CM | POA: Diagnosis not present

## 2020-09-29 MED ORDER — DEXCOM G6 TRANSMITTER MISC: 1.0000 | 3 refills | Status: DC

## 2020-09-29 MED ORDER — DEXCOM G6 SENSOR MISC: 1.0000 | 6 refills | Status: AC

## 2020-09-29 MED ORDER — OMNIPOD DASH INTRO (GEN 4) KIT: 1.0000 | PACK | Freq: Once | 0 refills | Status: AC

## 2020-09-29 MED ORDER — OMNIPOD DASH PODS (GEN 4) MISC: 1.0000 | 6 refills | Status: DC

## 2020-09-29 NOTE — Progress Notes (Signed)
CGM Training - Dexcom G6 Lot # [id number] Exp# 2021-06-20  Start JOIT:2549    End time: 0910 Total time: 45  [x]   Download Dexcom G6 and Clarity apps to phone [x]   Accept invite to share data through Clarity [x]   Getting to know device: Sensor:  2 hour warmup for new sensor/transmitter Waterproof Receiver: Phone vs reader. Turn on Location and bluetooth [x]   Setting up device (high alert  200, low alert 60  ) [x]   Setting alert profile: alarms are very loud. Also, not to panic if getting message that blood sugar is dropping [x]   Inserting sensor: After application it takes 2 hr initial warm up before first reading.  [x]   Calibrating  [x]   Ending sensor session [x]   Trouble shooting:  []   Tape guide:  [x]   Insulin dosing from CGM readings.   Patient has Dexcom tech support and my contact information.  Rx for Omnipod DASH changed every 48 hrs and DexcomG6 to be sent to [facility] pharmacy. Message sent to [facility] clinical Pool  Current insulin medication: TDD = 105 units Lantus 45 units at bedtime Novolog 20 units tid with meals

## 2020-09-29 NOTE — Progress Notes (Signed)
Omnipods and Dexcom ordered per Heywood Bene, RD.

## 2020-10-06 ENCOUNTER — Telehealth: Payer: Self-pay

## 2020-10-06 ENCOUNTER — Other Ambulatory Visit: Payer: Self-pay | Admitting: Lactation Services

## 2020-10-06 ENCOUNTER — Telehealth: Payer: Self-pay | Admitting: Registered"

## 2020-10-06 MED ORDER — OMNIPOD DASH PODS (GEN 4) MISC: 1.0000 | 6 refills | Status: DC

## 2020-10-06 NOTE — Telephone Encounter (Signed)
[employer] carrier test resulted positive for silent carrier of Alpha-Thalassemia. Called pt with results. Joanne Waller Dentist phone number given. Explained that the recommendation is for partner to complete carrier testing. Offered for patient to come to office to pick up partner test or coordinate this with [employer] directly.

## 2020-10-06 NOTE — Telephone Encounter (Signed)
Called pt to follow-up after starting a sample Dexcom. Patient states she has not received notification from pharmacy that the Rx has been filled. 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 Pt states she will get insulin today.

## 2020-10-06 NOTE — Progress Notes (Signed)
Reordered Omni Pod to be sent to patients Pharmacy per Heywood Bene, RD.

## 2020-10-07 ENCOUNTER — Other Ambulatory Visit: Payer: Self-pay

## 2020-10-07 DIAGNOSIS — O24119 Pre-existing diabetes mellitus, type 2, in pregnancy, unspecified trimester: Secondary | ICD-10-CM

## 2020-10-07 MED ORDER — OMNIPOD DASH INTRO (GEN 4) KIT: 105.0000 [IU] | PACK | Freq: Every day | 0 refills | Status: DC

## 2020-10-07 MED ORDER — INSULIN ASPART 100 UNIT/ML IJ SOLN: 100.0000 [IU] | Freq: Every day | INTRAMUSCULAR | 12 refills | Status: DC

## 2020-10-07 NOTE — Telephone Encounter (Signed)
Joanne Waller, RD  P [facility] Clinical Pool Please e-scribe Omnipod DASH intro kit (Gen 4) Rx to [facility] pls  Also Novolog vials to her local pharmacy. Her current TDD of Lantus and Novolog is 105 u/day if that helps.   Thanks!  [name]

## 2020-10-11 ENCOUNTER — Ambulatory Visit (INDEPENDENT_AMBULATORY_CARE_PROVIDER_SITE_OTHER): Payer: BC Managed Care – PPO | Admitting: Family Medicine

## 2020-10-11 ENCOUNTER — Other Ambulatory Visit: Payer: Self-pay

## 2020-10-11 ENCOUNTER — Encounter: Payer: Self-pay | Admitting: Registered"

## 2020-10-11 VITALS — BP 121/72 | HR 98 | Wt 281.0 lb

## 2020-10-11 DIAGNOSIS — Z3A17 17 weeks gestation of pregnancy: Secondary | ICD-10-CM

## 2020-10-11 DIAGNOSIS — O0990 Supervision of high risk pregnancy, unspecified, unspecified trimester: Secondary | ICD-10-CM | POA: Diagnosis not present

## 2020-10-11 DIAGNOSIS — O24119 Pre-existing diabetes mellitus, type 2, in pregnancy, unspecified trimester: Secondary | ICD-10-CM

## 2020-10-11 DIAGNOSIS — O10019 Pre-existing essential hypertension complicating pregnancy, unspecified trimester: Secondary | ICD-10-CM

## 2020-10-11 MED ORDER — OMNIPOD DASH INTRO (GEN 4) KIT: 1.0000 | PACK | 0 refills | Status: DC

## 2020-10-11 MED ORDER — OMNIPOD DASH PODS (GEN 4) MISC: 1.0000 "application " | 2 refills | Status: DC

## 2020-10-11 NOTE — Progress Notes (Signed)
Subjective:  Joanne Waller is a 35 y.o. G3P1011 at [redacted]w[redacted]d being seen today for ongoing prenatal care.  She is currently monitored for the following issues for this high-risk pregnancy and has Closed avulsion fracture of left ankle; Supervision of high risk pregnancy, antepartum; Hypertension; Pre-existing type 2 diabetes affecting pregnancy, antepartum; and Pre-existing essential hypertension during pregnancy, antepartum on their problem list.  Reports that her Dexcom came off two days ago and she will be inserting a new one today.   #DM FBG: 93 (range 90s) 2 hr Postprandial 123 Novolog restarted Sunday (two days ago), was out for 5 days Lantus out since Friday (4 days ago), pharmacy saying she needs prior auth for refill  Patient reports no bleeding.  Contractions: Not present. Vag. Bleeding: None.  Movement: Absent. Denies leaking of fluid.   The following portions of the patient's history were reviewed and updated as appropriate: allergies, current medications, past family history, past medical history, past social history, past surgical history and problem list. Problem list updated.  Objective:   Vitals:   10/11/20 1115  BP: 121/72  Pulse: 98  Weight: 281 lb (127.5 kg)    Fetal Status: Fetal Heart Rate (bpm): 147 Fundal Height: 18 cm Movement: Absent     General:  Alert, oriented and cooperative. Patient is in no acute distress.  Skin: Skin is warm and dry. No rash noted.   Cardiovascular: Normal heart rate noted  Respiratory: Normal respiratory effort, no problems with respiration noted  Abdomen: Soft, gravid, appropriate for gestational age. Pain/Pressure: Present     Pelvic: Vag. Bleeding: None     Cervical exam deferred        Extremities: Normal range of motion.  Edema: Trace  Mental Status: Normal mood and affect. Normal behavior. Normal judgment and thought content.    Assessment and Plan:  Pregnancy: G3P1011 at [redacted]w[redacted]d  1. Supervision of high risk pregnancy,  antepartum 2. [redacted] weeks gestation of pregnancy Pregnancy high risk with DM2 on insulin and cHTN. Otherwise progressing appropriately and as expected with fetal heart tones and fundal height being appropriate. Has [place] scheduled on 9/14. AFP testing today.  - AFP, Serum, Open Spina Bifida  3. Pre-existing type 2 diabetes affecting pregnancy, antepartum On Metformin, Novolog 20U TID (missed for 4 days when out but now resumed), and Lantus 45u qHS(hasn't used for 4 days due to running out and insurance not approving refill without prior auth). BG at home fasting in 90s and 2 hr post prandial 120s-130s, therefore continues to be slightly above goal. Discussed in detail regarding appropriate goals for BG levels and reasons for close monitoring and potential complications of uncontrolled diabetes in pregnancy. Patient expressed understanding. - Omnipod supplies ordered today and MD signed form - Talked with pharmacy, will resend prior auth form for Lantus - Continue Novolog 20UTID and resume Lantus 45U qHS as soon as insurance prior [place] approval goes through.  - F/up in 2 weeks to ensure able to restart Lantus, go over BG levels, and check in about Omnipod. If all settled can likely space out to q4weeks until 28 weeks.   4. Pre-existing essential hypertension during pregnancy, antepartum On labetalol 100mg . Taking ASA. BP today at goal at 121/72 - Continue above medications   Preterm labor symptoms and general obstetric precautions including but not limited to vaginal bleeding, contractions, leaking of fluid and fetal movement were reviewed in detail with the patient. Please refer to After Visit Summary for other counseling recommendations.  Return  in about 2 weeks (around 10/25/2020) for HROB, any MD.   Warner Mccreedy, MD, MPH OB Fellow, Faculty Practice

## 2020-10-11 NOTE — Progress Notes (Unsigned)
Patient has started using Dexcom G6 CGM. Patient has started the process of receiving Omnipod supplies and working on getting set up with a trainer.   Patient states she is only using Novolog with meals since the pharmacy wouldn't fill the lantus  RD will be following up with clinical staff this week to make sure the process doesn't get hung up with pre-authorization, etc.

## 2020-10-12 MED ORDER — INSULIN GLARGINE-YFGN 100 UNIT/ML ~~LOC~~ SOPN: 45.0000 [IU] | PEN_INJECTOR | Freq: Every day | SUBCUTANEOUS | 5 refills | Status: DC

## 2020-10-12 NOTE — Addendum Note (Signed)
Addended by: Isabell Jarvis on: 10/12/2020 08:59 AM   Modules accepted: Orders

## 2020-10-14 ENCOUNTER — Encounter: Payer: Self-pay | Admitting: Family Medicine

## 2020-10-14 DIAGNOSIS — D563 Thalassemia minor: Secondary | ICD-10-CM | POA: Insufficient documentation

## 2020-10-15 LAB — AFP, SERUM, OPEN SPINA BIFIDA
AFP MoM: 1.38
AFP Value: 37.9 ng/mL
Gest. Age on Collection Date: 17.9 weeks
Maternal Age At EDD: 33.3 yr
OSBR Risk 1 IN: 2275
Test Results:: NEGATIVE
Weight: 281 [lb_av]

## 2020-10-17 ENCOUNTER — Telehealth: Payer: Self-pay

## 2020-10-17 NOTE — Telephone Encounter (Addendum)
-----   Message from Reva Bores, MD sent at 10/14/2020  6:27 PM EDT ----- Ensure she is set up with genetic counseling through [employer]  Pt concern addressed in telephone encounter 10/06/20.  Leonette Nutting  10/17/20

## 2020-10-19 ENCOUNTER — Ambulatory Visit: Payer: BC Managed Care – PPO | Attending: Medical

## 2020-10-19 ENCOUNTER — Encounter: Payer: Self-pay | Admitting: *Deleted

## 2020-10-19 ENCOUNTER — Other Ambulatory Visit: Payer: Self-pay | Admitting: *Deleted

## 2020-10-19 ENCOUNTER — Ambulatory Visit (HOSPITAL_BASED_OUTPATIENT_CLINIC_OR_DEPARTMENT_OTHER): Payer: BC Managed Care – PPO | Admitting: Obstetrics

## 2020-10-19 ENCOUNTER — Other Ambulatory Visit: Payer: Self-pay

## 2020-10-19 ENCOUNTER — Ambulatory Visit: Payer: BC Managed Care – PPO | Admitting: *Deleted

## 2020-10-19 VITALS — BP 120/70 | HR 105

## 2020-10-19 DIAGNOSIS — O99212 Obesity complicating pregnancy, second trimester: Secondary | ICD-10-CM | POA: Diagnosis not present

## 2020-10-19 DIAGNOSIS — O10912 Unspecified pre-existing hypertension complicating pregnancy, second trimester: Secondary | ICD-10-CM

## 2020-10-19 DIAGNOSIS — Z3A19 19 weeks gestation of pregnancy: Secondary | ICD-10-CM

## 2020-10-19 DIAGNOSIS — I10 Essential (primary) hypertension: Secondary | ICD-10-CM | POA: Insufficient documentation

## 2020-10-19 DIAGNOSIS — O0990 Supervision of high risk pregnancy, unspecified, unspecified trimester: Secondary | ICD-10-CM | POA: Insufficient documentation

## 2020-10-19 DIAGNOSIS — O24119 Pre-existing diabetes mellitus, type 2, in pregnancy, unspecified trimester: Secondary | ICD-10-CM

## 2020-10-19 DIAGNOSIS — O24112 Pre-existing diabetes mellitus, type 2, in pregnancy, second trimester: Secondary | ICD-10-CM | POA: Diagnosis not present

## 2020-10-19 DIAGNOSIS — Z6839 Body mass index (BMI) 39.0-39.9, adult: Secondary | ICD-10-CM

## 2020-10-19 NOTE — Progress Notes (Signed)
MFM Note  Joanne Waller was seen for a detailed fetal anatomy scan due to maternal obesity, pregestational diabetes treated with insulin and metformin, and chronic hypertension treated with labetalol 100 mg twice a day.    She reports that she was diagnosed with diabetes about 8 years ago.  Her hemoglobin A1c drawn earlier in her pregnancy was 11.3%.  She reports that her fingerstick values are improving now that she has been started on insulin.  She has an OmniPod insulin pump ordered.    Her P/C ratio performed earlier in her pregnancy did not indicate significant proteinuria.  Her serum creatinine levels were 0.69 (within normal limits).  She has an eye exam to screen for diabetic retinopathy scheduled in about 2 months.    She was diagnosed with hypertension about 6 years ago.  She had a cell free DNA test earlier in her pregnancy which indicated a low risk for trisomy 45, 4, and 13. A female fetus is predicted.  She has screened positive as a silent carrier for alpha thalassemia.  She was informed that the fetal growth and amniotic fluid level were appropriate for her gestational age.   There were no obvious fetal anomalies noted on today's ultrasound exam.  However, the views of the fetal anatomy were limited today due to the fetal position and maternal body habitus.  The patient was informed that anomalies may be missed due to technical limitations. If the fetus is in a suboptimal position or maternal habitus is increased, visualization of the fetus in the maternal uterus may be impaired.  The following were discussed during our consultation today:  Pregestational diabetes and pregnancy  The implications and management of diabetes in pregnancy was discussed in detail with the patient.  She was advised to continue to monitor her fingersticks 4 times daily (fasting and 2 hours after each meal).    She was advised that our goals for her fingerstick values are fasting values of 90-95 or less  and two-hour postprandial values of 120 or less.  Should the majority of her fingerstick results be above these values, her insulin/metformin dosages may have to be increased to help her achieve better glycemic control. The patient was advised that getting her fingerstick values as close to these goals as possible would provide her with the most optimal obstetrical outcome.  The increased risks of fetal congenital malformations such as heart defects and other abnormalities associated with diabetes was discussed.  She was reassured by today's normal ultrasound exam.  She was referred to [facility] pediatric cardiology for a fetal echocardiogram.   We will continue to follow her with monthly growth ultrasounds.  Weekly fetal testing should be started at around 32 weeks.    The increased risk of polyhydramnios, fetal macrosomia, and preeclampsia associated with diabetes was also discussed.    The patient was advised that delivery for well-controlled diabetes in pregnancy is usually recommended at around 39 weeks.  Delivery at 37 weeks may be considered should her glycemic control be poor or should she develop preeclampsia.  Chronic hypertension in pregnancy  The implications and management of chronic hypertension in pregnancy was discussed. The patient was advised that should her blood pressures be elevated later in pregnancy, the dosage of labetalol may have to be increased to help her achieve better blood pressure control.    The increased risk of superimposed preeclampsia, an indicated preterm delivery, and possible fetal growth restriction due to chronic hypertension in pregnancy was discussed. The patient was advised  that we will continue to follow her closely throughout her pregnancy.   To decrease her risk of superimposed preeclampsia, she should continue taking a daily baby aspirin (81 mg daily) for preeclampsia prophylaxis.   Obesity in pregnancy  The recommended total weight gain in pregnancy for  obese women's between 10 to 20 pounds.  As maternal obesity may present challenges associated with the management of anesthesia, an anesthesia consult should be obtained when she is admitted in labor.  The patient was reassured that many pregnant women have pregnancies complicated by pregestational diabetes, chronic hypertension, and obesity.  With proper management, most women with these conditions may anticipate a successful pregnancy outcome.    The patient stated that all of her questions have been answered to her satisfaction.    A follow-up exam was scheduled in 4 weeks to complete the views of the fetal anatomy and to assess the fetal growth.  A total of 45 minutes was spent counseling and coordinating the care for this patient.  Greater than 50% of the time was spent in direct face-to-face contact.   Recommendations:  Continue insulin and metformin for glycemic control Continue labetalol for blood pressure control Continue daily baby aspirin Monthly growth ultrasounds Weekly fetal testing to be started at 32 weeks Delivery at between 37 to 39 weeks depending on glycemic control and blood pressures Anesthesia evaluation when she is admitted for delivery

## 2020-10-20 ENCOUNTER — Other Ambulatory Visit: Payer: Self-pay | Admitting: Lactation Services

## 2020-10-20 MED ORDER — OMNIPOD DASH INTRO (GEN 4) KIT: 1.0000 | PACK | 0 refills | Status: DC

## 2020-10-20 NOTE — Progress Notes (Signed)
Reordered kit to be sent to her pharmacy instead of [facility].

## 2020-10-26 ENCOUNTER — Ambulatory Visit (INDEPENDENT_AMBULATORY_CARE_PROVIDER_SITE_OTHER): Payer: BC Managed Care – PPO | Admitting: Obstetrics & Gynecology

## 2020-10-26 ENCOUNTER — Other Ambulatory Visit: Payer: Self-pay

## 2020-10-26 VITALS — BP 103/65 | HR 124 | Wt 280.0 lb

## 2020-10-26 DIAGNOSIS — O10019 Pre-existing essential hypertension complicating pregnancy, unspecified trimester: Secondary | ICD-10-CM

## 2020-10-26 DIAGNOSIS — O0990 Supervision of high risk pregnancy, unspecified, unspecified trimester: Secondary | ICD-10-CM

## 2020-10-26 DIAGNOSIS — O24119 Pre-existing diabetes mellitus, type 2, in pregnancy, unspecified trimester: Secondary | ICD-10-CM

## 2020-11-10 ENCOUNTER — Other Ambulatory Visit: Payer: Self-pay

## 2020-11-10 ENCOUNTER — Encounter: Payer: Self-pay | Admitting: Obstetrics & Gynecology

## 2020-11-10 ENCOUNTER — Ambulatory Visit (INDEPENDENT_AMBULATORY_CARE_PROVIDER_SITE_OTHER): Payer: BC Managed Care – PPO | Admitting: Obstetrics & Gynecology

## 2020-11-10 VITALS — BP 116/80 | HR 127 | Wt 282.5 lb

## 2020-11-10 DIAGNOSIS — O10019 Pre-existing essential hypertension complicating pregnancy, unspecified trimester: Secondary | ICD-10-CM

## 2020-11-10 DIAGNOSIS — O24119 Pre-existing diabetes mellitus, type 2, in pregnancy, unspecified trimester: Secondary | ICD-10-CM

## 2020-11-10 DIAGNOSIS — O0990 Supervision of high risk pregnancy, unspecified, unspecified trimester: Secondary | ICD-10-CM

## 2020-11-10 DIAGNOSIS — Z3A22 22 weeks gestation of pregnancy: Secondary | ICD-10-CM

## 2020-11-10 MED ORDER — INSULIN ASPART 100 UNIT/ML IJ SOLN: 24.0000 [IU] | Freq: Every day | INTRAMUSCULAR | 12 refills | Status: DC

## 2020-11-10 MED ORDER — INSULIN GLARGINE-YFGN 100 UNIT/ML ~~LOC~~ SOPN: 50.0000 [IU] | PEN_INJECTOR | Freq: Every day | SUBCUTANEOUS | 5 refills | Status: DC

## 2020-11-10 NOTE — Progress Notes (Signed)
   PRENATAL VISIT NOTE  Subjective:  Joanne Waller is a 35 y.o. G3P1011 at [redacted]w[redacted]d being seen today for ongoing prenatal care.  She is currently monitored for the following issues for this high-risk pregnancy and has Closed avulsion fracture of left ankle; Supervision of high risk pregnancy, antepartum; Hypertension; Pre-existing type 2 diabetes affecting pregnancy, antepartum; Pre-existing essential hypertension during pregnancy, antepartum; and Alpha thalassemia silent carrier on their problem list.  Patient reports  low abdominal pressure/pain .  Contractions: Irritability. Vag. Bleeding: None.  Movement: Present. Denies leaking of fluid.   The following portions of the patient's history were reviewed and updated as appropriate: allergies, current medications, past family history, past medical history, past social history, past surgical history and problem list.   Objective:   Vitals:   11/10/20 1606  BP: 116/80  Pulse: (!) 127  Weight: 282 lb 8 oz (128.1 kg)    Fetal Status: Fetal Heart Rate (bpm): 141   Movement: Present     General:  Alert, oriented and cooperative. Patient is in no acute distress.  Skin: Skin is warm and dry. No rash noted.   Cardiovascular: Normal heart rate noted  Respiratory: Normal respiratory effort, no problems with respiration noted  Abdomen: Soft, gravid, appropriate for gestational age.  Pain/Pressure: Present     Pelvic: Cervical exam deferred        Extremities: Normal range of motion.  Edema: None  Mental Status: Normal mood and affect. Normal behavior. Normal judgment and thought content.   Assessment and Plan:  Pregnancy: G3P1011 at [redacted]w[redacted]d 1. Pre-existing type 2 diabetes affecting pregnancy, antepartum FBS 130, pp 160-p to 220 - insulin aspart (NOVOLOG) 100 UNIT/ML injection; Inject 24 Units into the skin daily.  Dispense: 10 mL; Refill: 12 - insulin glargine-yfgn (SEMGLEE) 100 UNIT/ML Pen; Inject 50 Units into the skin at bedtime.  Dispense: 15  mL; Refill: 5 Still waiting for her Omnipod 2. Pre-existing essential hypertension during pregnancy, antepartum Continue labetalol  3. Supervision of high risk pregnancy, antepartum F/u US next week  Preterm labor symptoms and general obstetric precautions including but not limited to vaginal bleeding, contractions, leaking of fluid and fetal movement were reviewed in detail with the patient. Please refer to After Visit Summary for other counseling recommendations.   Return in about 3 weeks (around 12/01/2020).  Future Appointments  Date Time Provider Department Center  11/16/2020  3:30 PM [facility] NURSE [facility]  11/16/2020  3:45 PM [facility] US5 [facility]    Scheryl Darter, MD

## 2020-11-16 ENCOUNTER — Other Ambulatory Visit: Payer: Self-pay

## 2020-11-16 ENCOUNTER — Ambulatory Visit: Payer: BC Managed Care – PPO | Admitting: *Deleted

## 2020-11-16 ENCOUNTER — Ambulatory Visit: Payer: BC Managed Care – PPO | Attending: Obstetrics

## 2020-11-16 VITALS — BP 126/70 | HR 99

## 2020-11-16 DIAGNOSIS — Z6839 Body mass index (BMI) 39.0-39.9, adult: Secondary | ICD-10-CM | POA: Insufficient documentation

## 2020-11-16 DIAGNOSIS — E669 Obesity, unspecified: Secondary | ICD-10-CM | POA: Diagnosis not present

## 2020-11-16 DIAGNOSIS — O24112 Pre-existing diabetes mellitus, type 2, in pregnancy, second trimester: Secondary | ICD-10-CM | POA: Insufficient documentation

## 2020-11-16 DIAGNOSIS — O0990 Supervision of high risk pregnancy, unspecified, unspecified trimester: Secondary | ICD-10-CM | POA: Diagnosis not present

## 2020-11-16 DIAGNOSIS — Z362 Encounter for other antenatal screening follow-up: Secondary | ICD-10-CM

## 2020-11-16 DIAGNOSIS — O10012 Pre-existing essential hypertension complicating pregnancy, second trimester: Secondary | ICD-10-CM

## 2020-11-16 DIAGNOSIS — I10 Essential (primary) hypertension: Secondary | ICD-10-CM | POA: Insufficient documentation

## 2020-11-16 DIAGNOSIS — O24119 Pre-existing diabetes mellitus, type 2, in pregnancy, unspecified trimester: Secondary | ICD-10-CM | POA: Insufficient documentation

## 2020-11-16 DIAGNOSIS — O99212 Obesity complicating pregnancy, second trimester: Secondary | ICD-10-CM | POA: Diagnosis not present

## 2020-11-16 DIAGNOSIS — O10912 Unspecified pre-existing hypertension complicating pregnancy, second trimester: Secondary | ICD-10-CM | POA: Insufficient documentation

## 2020-11-16 DIAGNOSIS — Z3A23 23 weeks gestation of pregnancy: Secondary | ICD-10-CM

## 2020-11-21 ENCOUNTER — Other Ambulatory Visit: Payer: Self-pay | Admitting: *Deleted

## 2020-11-21 DIAGNOSIS — O24112 Pre-existing diabetes mellitus, type 2, in pregnancy, second trimester: Secondary | ICD-10-CM

## 2020-11-24 ENCOUNTER — Telehealth: Payer: Self-pay | Admitting: Lactation Services

## 2020-11-24 ENCOUNTER — Encounter: Payer: Self-pay | Admitting: *Deleted

## 2020-11-24 MED ORDER — OMNIPOD DASH INTRO (GEN 4) KIT: 1.0000 | PACK | Freq: Once | 0 refills | Status: AC

## 2020-11-24 MED ORDER — OMNIPOD DASH PODS (GEN 4) MISC: 1.0000 | 6 refills | Status: DC

## 2020-11-24 NOTE — Telephone Encounter (Signed)
Received PA request form [facility] for [employer]. Called and spoke with Freda Munro at [facility] department. Per PA Department, prescription needs to go through her primary insurance first and then the Medicaid will pick up the balance. PA is not required if ran in this fashion.   Called patient and she reports she has the [facility], however has not received the Omnipod that was ordered in early October.   Called [facility] and they report Omnipods need a PA through [facility] and indicated there is a quanity limit. Reviewed order and updated to send kit to [facility] and to decrease amount of Omnipods in order.   Called patient back and informed her that I have resent correction prescriptions and am awaiting PA to come to be completed and faxed back. Patient voiced understanding.   Patient has an appointment in the office on Monday with Dr. Roselie Awkward. Will notify Levie Heritage, RD.

## 2020-11-24 NOTE — Telephone Encounter (Signed)
PA for Dex Com G6 Transmitter faxed to [facility]. Fax confirmation received.

## 2020-11-28 ENCOUNTER — Ambulatory Visit (INDEPENDENT_AMBULATORY_CARE_PROVIDER_SITE_OTHER): Payer: BC Managed Care – PPO | Admitting: Obstetrics & Gynecology

## 2020-11-28 ENCOUNTER — Other Ambulatory Visit: Payer: Self-pay

## 2020-11-28 ENCOUNTER — Other Ambulatory Visit (HOSPITAL_COMMUNITY)
Admission: RE | Admit: 2020-11-28 | Discharge: 2020-11-28 | Disposition: A | Discharge status: Home / Self Care | Payer: BC Managed Care – PPO | Source: Ambulatory Visit | Attending: Obstetrics & Gynecology | Admitting: Obstetrics & Gynecology

## 2020-11-28 VITALS — BP 127/76 | HR 103 | Wt 286.7 lb

## 2020-11-28 DIAGNOSIS — O0990 Supervision of high risk pregnancy, unspecified, unspecified trimester: Secondary | ICD-10-CM

## 2020-11-28 DIAGNOSIS — I10 Essential (primary) hypertension: Secondary | ICD-10-CM

## 2020-11-28 DIAGNOSIS — N898 Other specified noninflammatory disorders of vagina: Secondary | ICD-10-CM | POA: Insufficient documentation

## 2020-11-28 DIAGNOSIS — O26892 Other specified pregnancy related conditions, second trimester: Secondary | ICD-10-CM | POA: Diagnosis not present

## 2020-11-28 DIAGNOSIS — O24119 Pre-existing diabetes mellitus, type 2, in pregnancy, unspecified trimester: Secondary | ICD-10-CM

## 2020-11-28 NOTE — Progress Notes (Signed)
Patient stated that she has ain in lower abdomen when baby is moving, other than that patient is feeling fine.   Dawayne Patricia, CMA   11/28/20

## 2020-11-28 NOTE — Progress Notes (Signed)
   PRENATAL VISIT NOTE  Subjective:  Joanne Waller is a 35 y.o. G3P1011 at [redacted]w[redacted]d being seen today for ongoing prenatal care.  She is currently monitored for the following issues for this high-risk pregnancy and has Closed avulsion fracture of left ankle; Supervision of high risk pregnancy, antepartum; Hypertension; Pre-existing type 2 diabetes affecting pregnancy, antepartum; Pre-existing essential hypertension during pregnancy, antepartum; and Alpha thalassemia silent carrier on their problem list.  Patient reports  daily pelvic pressure/pain along with some upper abdominal pressure/pain and pain in her back. She does not endorse worsening headaches, changes in her vision, or swelling in her legs. She notes recent vaginal discharge which she suspects is related to BV. She is unable to provide details on color or consistency of the discharge. She has been monitoring her blood sugar at home with regular measurements between 136-150. She endorses urinary frequency at every 2 hours .  Contractions: Irritability. Vag. Bleeding: None.  Movement: Present. Denies leaking of fluid.   The following portions of the patient's history were reviewed and updated as appropriate: allergies, current medications, past family history, past medical history, past social history, past surgical history and problem list.   Objective:   Vitals:   11/28/20 1039  BP: 127/76  Pulse: (!) 103  Weight: 286 lb 11.2 oz (130 kg)    Fetal Status: Fetal Heart Rate (bpm): 156   Movement: Present     General:  Alert, oriented and cooperative. Patient is in no acute distress.  Skin: Skin is warm and dry. No rash noted.   Cardiovascular: Normal heart rate noted  Respiratory: Normal respiratory effort, no problems with respiration noted  Abdomen: Soft, gravid, appropriate for gestational age.  Pain/Pressure: Absent     Pelvic: Cervical exam performed in the presence of a chaperone. Specimen of discharge obtained.        Extremities: Normal range of motion.  Edema: None  Mental Status: Normal mood and affect. Normal behavior. Normal judgment and thought content.   Assessment and Plan:  Pregnancy: G3P1011 at [redacted]w[redacted]d 1. Pre-existing type 2 diabetes affecting pregnancy, antepartum Glucose levels ranging from 130-150. Patient to receive Omnipod tomorrow which should allow for improvements in glucose control moving forward. -Pt to continue monitoring blood glucose levels. -Transition from current daily insulin regimen to Omnipod pump  2. Supervision of high risk pregnancy, antepartum Pt reports new vaginal discharge. Specimen obtained today. -F/u on discharge specimen results  3. Hypertension, unspecified type -Continue labetalol  Preterm labor symptoms and general obstetric precautions including but not limited to vaginal bleeding, contractions, leaking of fluid and fetal movement were reviewed in detail with the patient. Please refer to After Visit Summary for other counseling recommendations.   Return in about 4 weeks (around 12/26/2020).  Future Appointments  Date Time Provider Department Center  12/15/2020  3:30 PM [facility] NURSE [facility]  12/15/2020  3:45 PM [facility] US4 [facility]    Val Eagle, Medical Student Attestation of Attending Supervision of Medical Student: Evaluation and management procedures were performed by the medical student under my supervision and collaboration.  I have reviewed the student's note and chart, and I agree with the management and plan.  Scheryl Darter, MD, FACOG Attending Obstetrician & Gynecologist Faculty Practice, [facility]

## 2020-11-29 ENCOUNTER — Telehealth: Payer: Self-pay | Admitting: Lactation Services

## 2020-11-29 LAB — CERVICOVAGINAL ANCILLARY ONLY
Bacterial Vaginitis (gardnerella): POSITIVE — AB
Candida Glabrata: NEGATIVE
Candida Vaginitis: NEGATIVE
Chlamydia: NEGATIVE
Comment: NEGATIVE
Comment: NEGATIVE
Comment: NEGATIVE
Comment: NEGATIVE
Comment: NEGATIVE
Comment: NORMAL
Neisseria Gonorrhea: NEGATIVE
Trichomonas: NEGATIVE

## 2020-11-29 NOTE — Telephone Encounter (Signed)
Received message from [facility] to call in regards to PA.   Called and spoke with Claris Che, at [facility] pharmacy. She was asking if PA had been completed, reviewed that PA request has not been seen in the office. Per her cahrt review, she did not see that PA had been sent, will have it resent.

## 2020-11-30 MED ORDER — METRONIDAZOLE 500 MG PO TABS: 500.0000 mg | ORAL_TABLET | Freq: Two times a day (BID) | ORAL | 0 refills | Status: AC

## 2020-11-30 NOTE — Addendum Note (Signed)
Addended by: Adam Phenix on: 11/30/2020 08:40 AM   Modules accepted: Orders

## 2020-12-09 NOTE — Telephone Encounter (Signed)
Received PA from [facility] for Omnipod DASH PODS.  Called pt and pt states was waiting on this Rx. Has not obtained.   PA form faxed back to [facility] today 12/09/20. Judeth Cornfield, RN

## 2020-12-15 ENCOUNTER — Telehealth: Payer: Self-pay | Admitting: Lactation Services

## 2020-12-15 ENCOUNTER — Encounter: Payer: Self-pay | Admitting: *Deleted

## 2020-12-15 ENCOUNTER — Ambulatory Visit: Payer: BC Managed Care – PPO | Admitting: *Deleted

## 2020-12-15 ENCOUNTER — Other Ambulatory Visit: Payer: Self-pay

## 2020-12-15 ENCOUNTER — Ambulatory Visit: Payer: BC Managed Care – PPO | Attending: Obstetrics and Gynecology

## 2020-12-15 VITALS — BP 121/68 | HR 95

## 2020-12-15 DIAGNOSIS — E119 Type 2 diabetes mellitus without complications: Secondary | ICD-10-CM

## 2020-12-15 DIAGNOSIS — O0990 Supervision of high risk pregnancy, unspecified, unspecified trimester: Secondary | ICD-10-CM

## 2020-12-15 DIAGNOSIS — O24119 Pre-existing diabetes mellitus, type 2, in pregnancy, unspecified trimester: Secondary | ICD-10-CM | POA: Insufficient documentation

## 2020-12-15 DIAGNOSIS — O24112 Pre-existing diabetes mellitus, type 2, in pregnancy, second trimester: Secondary | ICD-10-CM | POA: Diagnosis not present

## 2020-12-15 DIAGNOSIS — I10 Essential (primary) hypertension: Secondary | ICD-10-CM | POA: Insufficient documentation

## 2020-12-15 DIAGNOSIS — O09812 Supervision of pregnancy resulting from assisted reproductive technology, second trimester: Secondary | ICD-10-CM | POA: Diagnosis not present

## 2020-12-15 DIAGNOSIS — E669 Obesity, unspecified: Secondary | ICD-10-CM

## 2020-12-15 DIAGNOSIS — O99212 Obesity complicating pregnancy, second trimester: Secondary | ICD-10-CM | POA: Diagnosis not present

## 2020-12-15 DIAGNOSIS — Z3A27 27 weeks gestation of pregnancy: Secondary | ICD-10-CM

## 2020-12-15 NOTE — Telephone Encounter (Signed)
Received PA approval for Omnipods.   Called Pharmacy and was informed they are on order with hopes they will come in tomorrow. Patient has been by the Pharmacy this morning and is aware they are on order.

## 2020-12-16 ENCOUNTER — Other Ambulatory Visit: Payer: Self-pay | Admitting: *Deleted

## 2020-12-16 DIAGNOSIS — Z6841 Body Mass Index (BMI) 40.0 and over, adult: Secondary | ICD-10-CM

## 2020-12-16 DIAGNOSIS — O10912 Unspecified pre-existing hypertension complicating pregnancy, second trimester: Secondary | ICD-10-CM

## 2020-12-16 DIAGNOSIS — O24112 Pre-existing diabetes mellitus, type 2, in pregnancy, second trimester: Secondary | ICD-10-CM

## 2020-12-19 DIAGNOSIS — Q2112 Patent foramen ovale: Secondary | ICD-10-CM | POA: Diagnosis not present

## 2020-12-27 ENCOUNTER — Other Ambulatory Visit: Payer: Self-pay

## 2020-12-27 ENCOUNTER — Encounter: Payer: Self-pay | Admitting: Obstetrics and Gynecology

## 2020-12-27 ENCOUNTER — Encounter: Payer: Self-pay | Admitting: Family Medicine

## 2020-12-27 ENCOUNTER — Ambulatory Visit (INDEPENDENT_AMBULATORY_CARE_PROVIDER_SITE_OTHER): Payer: BC Managed Care – PPO | Admitting: Obstetrics and Gynecology

## 2020-12-27 VITALS — BP 131/78 | HR 117 | Wt 288.0 lb

## 2020-12-27 DIAGNOSIS — I10 Essential (primary) hypertension: Secondary | ICD-10-CM

## 2020-12-27 DIAGNOSIS — O0990 Supervision of high risk pregnancy, unspecified, unspecified trimester: Secondary | ICD-10-CM

## 2020-12-27 DIAGNOSIS — O24119 Pre-existing diabetes mellitus, type 2, in pregnancy, unspecified trimester: Secondary | ICD-10-CM

## 2020-12-27 NOTE — Progress Notes (Signed)
   PRENATAL VISIT NOTE  Subjective:  Joanne Waller is a 35 y.o. G3P1011 at [redacted]w[redacted]d being seen today for ongoing prenatal care.  She is currently monitored for the following issues for this high-risk pregnancy and has Closed avulsion fracture of left ankle; Supervision of high risk pregnancy, antepartum; Hypertension; Pre-existing type 2 diabetes affecting pregnancy, antepartum; Pre-existing essential hypertension during pregnancy, antepartum; and Alpha thalassemia silent carrier on their problem list.  Patient reports headache.  Contractions: Irritability. Vag. Bleeding: None.  Movement: Present. Denies leaking of fluid.   The following portions of the patient's history were reviewed and updated as appropriate: allergies, current medications, past family history, past medical history, past social history, past surgical history and problem list.   Objective:   Vitals:   12/27/20 1052  BP: 131/78  Pulse: (!) 117  Weight: 288 lb (130.6 kg)    Fetal Status: Fetal Heart Rate (bpm): 145   Movement: Present     General:  Alert, oriented and cooperative. Patient is in no acute distress.  Skin: Skin is warm and dry. No rash noted.   Cardiovascular: Normal heart rate noted  Respiratory: Normal respiratory effort, no problems with respiration noted  Abdomen: Soft, gravid, appropriate for gestational age.  Pain/Pressure: Present     Pelvic: Cervical exam deferred        Extremities: Normal range of motion.  Edema: None  Mental Status: Normal mood and affect. Normal behavior. Normal judgment and thought content.   Assessment and Plan:  Pregnancy: G3P1011 at [redacted]w[redacted]d 1. Supervision of high risk pregnancy, antepartum Patient is doing well without complaints Third trimester labs today  2. Pre-existing type 2 diabetes affecting pregnancy, antepartum Patient reports CBGs within range with fasting values as high as 95 and pp as high as 162 with most values being less than 120 Will continue with current  insulin dosing Follow up growth ultrasound  3. Hypertension, unspecified type Stable on labetalol  Preterm labor symptoms and general obstetric precautions including but not limited to vaginal bleeding, contractions, leaking of fluid and fetal movement were reviewed in detail with the patient. Please refer to After Visit Summary for other counseling recommendations.   Return in about 2 weeks (around 01/10/2021) for in person, ROB, High risk.  Future Appointments  Date Time Provider Department Center  12/28/2020 12:30 PM Carolan Shiver, RD [facility]  01/16/2021  1:30 PM [facility] NURSE [facility]  01/16/2021  1:45 PM [facility] US6 [facility]  01/26/2021  1:15 PM [facility]  01/26/2021  2:15 PM Warden Fillers, MD [facility]  02/02/2021  1:15 PM [facility] NST [facility]  02/02/2021  2:15 PM Reva Bores, MD [facility]    Catalina Antigua, MD

## 2020-12-28 ENCOUNTER — Encounter: Payer: BC Managed Care – PPO | Attending: Family Medicine | Admitting: Registered"

## 2020-12-28 ENCOUNTER — Encounter: Payer: Self-pay | Admitting: Obstetrics and Gynecology

## 2020-12-28 ENCOUNTER — Other Ambulatory Visit: Payer: Self-pay

## 2020-12-28 LAB — CBC
Hematocrit: 36.5 % (ref 34.0–46.6)
Hemoglobin: 12.5 g/dL (ref 11.1–15.9)
MCH: 26.9 pg (ref 26.6–33.0)
MCHC: 34.2 g/dL (ref 31.5–35.7)
MCV: 79 fL (ref 79–97)
Platelets: 359 10*3/uL (ref 150–450)
RBC: 4.65 x10E6/uL (ref 3.77–5.28)
RDW: 11.7 % (ref 11.7–15.4)
WBC: 6.1 10*3/uL (ref 3.4–10.8)

## 2020-12-28 LAB — RPR: RPR Ser Ql: NONREACTIVE

## 2020-12-28 LAB — HIV ANTIBODY (ROUTINE TESTING W REFLEX): HIV Screen 4th Generation wRfx: NONREACTIVE

## 2020-12-28 MED ORDER — CYCLOBENZAPRINE HCL 10 MG PO TABS: 10.0000 mg | ORAL_TABLET | Freq: Three times a day (TID) | ORAL | 0 refills | Status: DC | PRN

## 2020-12-28 NOTE — Telephone Encounter (Signed)
Per Dr. Mathis Fare e-prescribed flexeril for round ligament pain.   Alesia Richards, RN

## 2021-01-02 ENCOUNTER — Encounter (HOSPITAL_COMMUNITY): Payer: Self-pay

## 2021-01-02 ENCOUNTER — Other Ambulatory Visit: Payer: Self-pay

## 2021-01-02 ENCOUNTER — Ambulatory Visit (HOSPITAL_COMMUNITY)
Admission: EM | Admit: 2021-01-02 | Discharge: 2021-01-02 | Disposition: A | Discharge status: Home / Self Care | Payer: BC Managed Care – PPO | Attending: Student | Admitting: Student

## 2021-01-02 ENCOUNTER — Encounter: Payer: Self-pay | Admitting: Obstetrics and Gynecology

## 2021-01-02 DIAGNOSIS — O99513 Diseases of the respiratory system complicating pregnancy, third trimester: Secondary | ICD-10-CM

## 2021-01-02 DIAGNOSIS — Z3A29 29 weeks gestation of pregnancy: Secondary | ICD-10-CM | POA: Diagnosis not present

## 2021-01-02 DIAGNOSIS — J111 Influenza due to unidentified influenza virus with other respiratory manifestations: Secondary | ICD-10-CM

## 2021-01-02 LAB — POC INFLUENZA A AND B ANTIGEN (URGENT CARE ONLY)
INFLUENZA A ANTIGEN, POC: NEGATIVE
INFLUENZA B ANTIGEN, POC: NEGATIVE

## 2021-01-02 NOTE — ED Triage Notes (Signed)
Pt presents with cough, chills, congestion, and headache X 3 days.

## 2021-01-02 NOTE — ED Provider Notes (Signed)
[facility]    CSN: [id number] Arrival date & time: 01/02/21  5400      History   Chief Complaint Chief Complaint  Patient presents with   URI    HPI Joanne Waller is a 35 y.o. female presenting with flulike symptoms following exposure to the flu.  Medical history current pregnancy (29 weeks).  Describes 1 day of cough, subjective chills, nasal congestion.  Has not monitored her temperature at home.  Has not taken medications for the symptoms.  Denies abdominal pain, vaginal spotting.  HPI  Past Medical History:  Diagnosis Date   Diabetes mellitus without complication ([place])    Hypertension    Pyelonephritis    Renal disorder    pyelonephritis   Seizures ([place])     Patient Active Problem List   Diagnosis Date Noted   Alpha thalassemia silent carrier 10/14/2020   Pre-existing type 2 diabetes affecting pregnancy, antepartum 08/15/2020   Pre-existing essential hypertension during pregnancy, antepartum 08/15/2020   Supervision of high risk pregnancy, antepartum 08/04/2020   Hypertension    Closed avulsion fracture of left ankle 08/14/2019    Past Surgical History:  Procedure Laterality Date   CHOLECYSTECTOMY      OB History     Gravida  3   Para  1   Term  1   Preterm      AB  1   Living  1      SAB  1   IAB      Ectopic      Multiple      Live Births  1            Home Medications    Prior to Admission medications   Medication Sig Start Date End Date Taking? Authorizing Provider  ACCU-CHEK GUIDE test strip  07/25/20   [provider]  Accu-Chek Softclix Lancets lancets daily. 06/13/20   [provider]  acetaminophen (TYLENOL) 325 MG tablet Take 2 tablets (650 mg total) by mouth every 6 (six) hours as needed. 08/03/19   Darr, Edison Nasuti, PA-C  aspirin EC 81 MG tablet Take 1 tablet (81 mg total) by mouth daily. Swallow whole. 08/15/20   Luvenia Redden, PA-C  B-D ULTRAFINE III SHORT PEN 31G X 8 MM MISC daily. 07/03/20    [provider]  Blood Glucose Monitoring Suppl (ACCU-CHEK GUIDE) w/Device KIT  04/27/20   [provider]  Blood Pressure Monitoring (BLOOD PRESSURE KIT) DEVI 1 each by Does not apply route once a week. 08/15/20   Luvenia Redden, PA-C  Continuous Blood Gluc Transmit (DEXCOM G6 TRANSMITTER) MISC 1 Device by Does not apply route every 3 (three) months. 09/29/20   Donnamae Jude, MD  cyclobenzaprine (FLEXERIL) 10 MG tablet Take 1 tablet (10 mg total) by mouth every 8 (eight) hours as needed for muscle spasms. 12/28/20   Genia Del, MD  insulin aspart (NOVOLOG) 100 UNIT/ML injection Inject 24 Units into the skin daily. 11/10/20   Woodroe Mode, MD  Insulin Disposable Pump (OMNIPOD DASH PODS, GEN 4,) MISC 1 Device by Does not apply route every other day. Patient not taking: Reported on 11/28/2020 11/24/20   Renard Matter, MD  insulin glargine-yfgn (SEMGLEE) 100 UNIT/ML Pen Inject 50 Units into the skin at bedtime. 11/10/20   Woodroe Mode, MD  labetalol (NORMODYNE) 100 MG tablet Take 100 mg by mouth 2 (two) times daily. 06/17/20   [provider]  metFORMIN (GLUCOPHAGE) 500 MG  tablet Take by mouth 2 (two) times daily with a meal.    [provider]  promethazine (PHENERGAN) 25 MG tablet Take 1 tablet (25 mg total) by mouth every 6 (six) hours as needed for nausea or vomiting. Patient not taking: Reported on 11/10/2020 08/18/20   Anyanwu, Laray Anger A, MD  ipratropium (ATROVENT) 0.06 % nasal spray Place 2 sprays into both nostrils 4 (four) times daily. 04/20/18 11/21/18  Ok Edwards, PA-C    Family History Family History  Problem Relation Age of Onset   Kidney disease Mother    Hypertension Mother    Sickle cell anemia Father    Hyperlipidemia Maternal Grandmother     Social History Social History   Tobacco Use   Smoking status: Former    Packs/day: 0.50    Years: 7.00    Pack years: 3.50    Types: Cigarettes    Quit date: 06/23/2020    Years since  quitting: 0.5   Smokeless tobacco: Never  Vaping Use   Vaping Use: Never used  Substance Use Topics   Alcohol use: Not Currently    Comment: not while preg   Drug use: No     Allergies   Amoxicillin, Penicillins, and Latex   Review of Systems Review of Systems  Constitutional:  Negative for appetite change, chills and fever.  HENT:  Positive for congestion. Negative for ear pain, rhinorrhea, sinus pressure, sinus pain and sore throat.   Eyes:  Negative for redness and visual disturbance.  Respiratory:  Positive for cough. Negative for chest tightness, shortness of breath and wheezing.   Cardiovascular:  Negative for chest pain and palpitations.  Gastrointestinal:  Negative for abdominal pain, constipation, diarrhea, nausea and vomiting.  Genitourinary:  Negative for dysuria, frequency and urgency.  Musculoskeletal:  Negative for myalgias.  Neurological:  Negative for dizziness, weakness and headaches.  Psychiatric/Behavioral:  Negative for confusion.   All other systems reviewed and are negative.   Physical Exam Triage Vital Signs ED Triage Vitals  Enc Vitals Group     BP 01/02/21 1131 128/81     Pulse Rate 01/02/21 1131 (!) 102     Resp 01/02/21 1131 20     Temp 01/02/21 1131 98.4 F (36.9 C)     Temp Source 01/02/21 1131 Oral     SpO2 01/02/21 1131 95 %     Weight --      Height --      Head Circumference --      Peak Flow --      Pain Score 01/02/21 1139 5     Pain Loc --      Pain Edu? --      Excl. in [place]? --    No data found.  Updated Vital Signs BP 128/81 (BP Location: Left Arm)   Pulse (!) 102   Temp 98.4 F (36.9 C) (Oral)   Resp 20   LMP 06/09/2020   SpO2 95%   Visual Acuity Right Eye Distance:   Left Eye Distance:   Bilateral Distance:    Right Eye Near:   Left Eye Near:    Bilateral Near:     Physical Exam Vitals reviewed.  Constitutional:      General: She is not in acute distress.    Appearance: Normal appearance. She is not  ill-appearing.  HENT:     Head: Normocephalic and atraumatic.     Right Ear: Tympanic membrane, ear canal and external ear normal. No tenderness. No  middle ear effusion. There is no impacted cerumen. Tympanic membrane is not perforated, erythematous, retracted or bulging.     Left Ear: Tympanic membrane, ear canal and external ear normal. No tenderness.  No middle ear effusion. There is no impacted cerumen. Tympanic membrane is not perforated, erythematous, retracted or bulging.     Nose: Nose normal. No congestion.     Mouth/Throat:     Mouth: Mucous membranes are moist.     Pharynx: Uvula midline. No oropharyngeal exudate or posterior oropharyngeal erythema.  Eyes:     Extraocular Movements: Extraocular movements intact.     Pupils: Pupils are equal, round, and reactive to light.  Cardiovascular:     Rate and Rhythm: Normal rate and regular rhythm.     Heart sounds: Normal heart sounds.  Pulmonary:     Effort: Pulmonary effort is normal.     Breath sounds: Normal breath sounds. No decreased breath sounds, wheezing, rhonchi or rales.  Abdominal:     Palpations: Abdomen is soft.     Tenderness: There is no abdominal tenderness. There is no guarding or rebound.     Comments: pregnant  Lymphadenopathy:     Cervical: No cervical adenopathy.     Right cervical: No superficial cervical adenopathy.    Left cervical: No superficial cervical adenopathy.  Neurological:     General: No focal deficit present.     Mental Status: She is alert and oriented to person, place, and time.  Psychiatric:        Mood and Affect: Mood normal.        Behavior: Behavior normal.        Thought Content: Thought content normal.        Judgment: Judgment normal.     UC Treatments / Results  Labs (all labs ordered are listed, but only abnormal results are displayed) Labs Reviewed  POC INFLUENZA A AND B ANTIGEN (URGENT CARE ONLY)    EKG   Radiology No results found.  Procedures Procedures  (including critical care time)  Medications Ordered in UC Medications - No data to display  Initial Impression / Assessment and Plan / UC Course  I have reviewed the triage vital signs and the nursing notes.  Pertinent labs & imaging results that were available during my care of the patient were reviewed by me and considered in my medical decision making (see chart for details).     This patient is a very pleasant 35 y.o. year old female presenting with influenza following exposure to this. Today this pt is afebrile nontachycardic nontachypneic, oxygenating well on room air, no wheezes rhonchi or rales.   Rapid influenza negative, suspect test was collected too early.  Tamiflu deferred given current pregnancy. List of medications safe in pregnancy provided.   Strict ED return precautions discussed. Patient verbalizes understanding and agreement.    Final Clinical Impressions(s) / UC Diagnoses   Final diagnoses:  Influenza with respiratory manifestation  Diseases of the resp sys comp pregnancy, third trimester     Discharge Instructions      -With a virus, you're typically contagious for 5-7 days, or as long as you're having fevers.  -Tylenol for fever reduction -You can also take mucinex  -Refer to list of medications safe in pregnancy for additional treatment options  -New symptoms like abdominal pain, vaginal spotting- call your OB or head to the ED     ED Prescriptions   None    PDMP not reviewed this encounter.  Hazel Sams, PA-C 01/02/21 1158

## 2021-01-02 NOTE — Discharge Instructions (Addendum)
-  With a virus, you're typically contagious for 5-7 days, or as long as you're having fevers.  -Tylenol for fever reduction -You can also take mucinex  -Refer to list of medications safe in pregnancy for additional treatment options  -New symptoms like abdominal pain, vaginal spotting- call your OB or head to the ED

## 2021-01-12 ENCOUNTER — Ambulatory Visit (INDEPENDENT_AMBULATORY_CARE_PROVIDER_SITE_OTHER): Payer: BC Managed Care – PPO | Admitting: Obstetrics & Gynecology

## 2021-01-12 ENCOUNTER — Other Ambulatory Visit: Payer: Self-pay

## 2021-01-12 ENCOUNTER — Encounter: Payer: Self-pay | Admitting: Obstetrics & Gynecology

## 2021-01-12 VITALS — BP 121/88 | HR 117 | Wt 285.0 lb

## 2021-01-12 DIAGNOSIS — O24119 Pre-existing diabetes mellitus, type 2, in pregnancy, unspecified trimester: Secondary | ICD-10-CM

## 2021-01-12 DIAGNOSIS — O0990 Supervision of high risk pregnancy, unspecified, unspecified trimester: Secondary | ICD-10-CM

## 2021-01-12 DIAGNOSIS — I10 Essential (primary) hypertension: Secondary | ICD-10-CM

## 2021-01-12 DIAGNOSIS — O10019 Pre-existing essential hypertension complicating pregnancy, unspecified trimester: Secondary | ICD-10-CM

## 2021-01-12 NOTE — Progress Notes (Signed)
Patient complains about sciatica nerve pain

## 2021-01-12 NOTE — Progress Notes (Signed)
   PRENATAL VISIT NOTE  Subjective:  Joanne Waller is a 35 y.o. G3P1011 at [redacted]w[redacted]d being seen today for ongoing prenatal care.  She is currently monitored for the following issues for this high-risk pregnancy and has Closed avulsion fracture of left ankle; Supervision of high risk pregnancy, antepartum; Hypertension; Pre-existing type 2 diabetes affecting pregnancy, antepartum; Pre-existing essential hypertension during pregnancy, antepartum; and Alpha thalassemia silent carrier on their problem list.  Patient reports backache and right sciatic pain .  Contractions: Not present.  .  Movement: Present. Denies leaking of fluid.   The following portions of the patient's history were reviewed and updated as appropriate: allergies, current medications, past family history, past medical history, past social history, past surgical history and problem list.   Objective:   Vitals:   01/12/21 1029  BP: (!) 118/93  Pulse: (!) 115  Weight: 285 lb (129.3 kg)    Fetal Status: Fetal Heart Rate (bpm): 146 Fundal Height: 33 cm Movement: Present     General:  Alert, oriented and cooperative. Patient is in no acute distress.  Skin: Skin is warm and dry. No rash noted.   Cardiovascular: Normal heart rate noted  Respiratory: Normal respiratory effort, no problems with respiration noted  Abdomen: Soft, gravid, appropriate for gestational age.  Pain/Pressure: Present     Pelvic: Cervical exam deferred        Extremities: Normal range of motion.  Edema: None  Mental Status: Normal mood and affect. Normal behavior. Normal judgment and thought content.   Assessment and Plan:  Pregnancy: G3P1011 at [redacted]w[redacted]d 1. Supervision of high risk pregnancy, antepartum Sciatic pain discussed  2. Pre-existing type 2 diabetes affecting pregnancy, antepartum Omnipod with fair control  3. Pre-existing essential hypertension during pregnancy, antepartum Continue labetalol  4. Hypertension, unspecified type   Preterm labor  symptoms and general obstetric precautions including but not limited to vaginal bleeding, contractions, leaking of fluid and fetal movement were reviewed in detail with the patient. Please refer to After Visit Summary for other counseling recommendations.   Return in about 2 weeks (around 01/26/2021).  Future Appointments  Date Time Provider Department Center  01/16/2021  1:30 PM [facility] NURSE [facility]  01/16/2021  1:45 PM [facility] US6 [facility]  01/26/2021  1:15 PM [facility]ST [facility]  01/26/2021  2:15 PM Warden Fillers, MD [facility]  02/02/2021  1:15 PM [facility] NST [facility]  02/02/2021  2:15 PM Reva Bores, MD [facility]    Scheryl Darter, MD

## 2021-01-16 ENCOUNTER — Other Ambulatory Visit: Payer: Self-pay | Admitting: *Deleted

## 2021-01-16 ENCOUNTER — Other Ambulatory Visit: Payer: Self-pay

## 2021-01-16 ENCOUNTER — Ambulatory Visit: Payer: BC Managed Care – PPO | Admitting: *Deleted

## 2021-01-16 ENCOUNTER — Ambulatory Visit: Payer: BC Managed Care – PPO | Attending: Obstetrics and Gynecology

## 2021-01-16 ENCOUNTER — Encounter: Payer: Self-pay | Admitting: *Deleted

## 2021-01-16 VITALS — BP 121/86 | HR 103

## 2021-01-16 DIAGNOSIS — O10012 Pre-existing essential hypertension complicating pregnancy, second trimester: Secondary | ICD-10-CM | POA: Diagnosis not present

## 2021-01-16 DIAGNOSIS — O24113 Pre-existing diabetes mellitus, type 2, in pregnancy, third trimester: Secondary | ICD-10-CM

## 2021-01-16 DIAGNOSIS — O10912 Unspecified pre-existing hypertension complicating pregnancy, second trimester: Secondary | ICD-10-CM | POA: Insufficient documentation

## 2021-01-16 DIAGNOSIS — E119 Type 2 diabetes mellitus without complications: Secondary | ICD-10-CM | POA: Diagnosis not present

## 2021-01-16 DIAGNOSIS — O0990 Supervision of high risk pregnancy, unspecified, unspecified trimester: Secondary | ICD-10-CM

## 2021-01-16 DIAGNOSIS — O24119 Pre-existing diabetes mellitus, type 2, in pregnancy, unspecified trimester: Secondary | ICD-10-CM | POA: Insufficient documentation

## 2021-01-16 DIAGNOSIS — O24112 Pre-existing diabetes mellitus, type 2, in pregnancy, second trimester: Secondary | ICD-10-CM

## 2021-01-16 DIAGNOSIS — O99213 Obesity complicating pregnancy, third trimester: Secondary | ICD-10-CM

## 2021-01-16 DIAGNOSIS — Z3A31 31 weeks gestation of pregnancy: Secondary | ICD-10-CM

## 2021-01-16 DIAGNOSIS — O10913 Unspecified pre-existing hypertension complicating pregnancy, third trimester: Secondary | ICD-10-CM

## 2021-01-26 ENCOUNTER — Other Ambulatory Visit: Payer: Self-pay

## 2021-01-26 ENCOUNTER — Ambulatory Visit (INDEPENDENT_AMBULATORY_CARE_PROVIDER_SITE_OTHER): Payer: BC Managed Care – PPO

## 2021-01-26 ENCOUNTER — Ambulatory Visit: Payer: BC Managed Care – PPO | Admitting: *Deleted

## 2021-01-26 ENCOUNTER — Ambulatory Visit (INDEPENDENT_AMBULATORY_CARE_PROVIDER_SITE_OTHER): Payer: BC Managed Care – PPO | Admitting: Obstetrics and Gynecology

## 2021-01-26 VITALS — BP 117/79 | HR 113 | Wt 293.8 lb

## 2021-01-26 DIAGNOSIS — Z3A33 33 weeks gestation of pregnancy: Secondary | ICD-10-CM

## 2021-01-26 DIAGNOSIS — O24119 Pre-existing diabetes mellitus, type 2, in pregnancy, unspecified trimester: Secondary | ICD-10-CM

## 2021-01-26 DIAGNOSIS — O0990 Supervision of high risk pregnancy, unspecified, unspecified trimester: Secondary | ICD-10-CM

## 2021-01-26 DIAGNOSIS — O99210 Obesity complicating pregnancy, unspecified trimester: Secondary | ICD-10-CM

## 2021-01-26 DIAGNOSIS — O10919 Unspecified pre-existing hypertension complicating pregnancy, unspecified trimester: Secondary | ICD-10-CM

## 2021-01-26 DIAGNOSIS — O403XX0 Polyhydramnios, third trimester, not applicable or unspecified: Secondary | ICD-10-CM

## 2021-01-26 DIAGNOSIS — D563 Thalassemia minor: Secondary | ICD-10-CM

## 2021-01-26 DIAGNOSIS — O10019 Pre-existing essential hypertension complicating pregnancy, unspecified trimester: Secondary | ICD-10-CM

## 2021-01-26 NOTE — Progress Notes (Signed)
° °  PRENATAL VISIT NOTE  Subjective:  Joanne Waller is a 35 y.o. G3P1011 at [redacted]w[redacted]d being seen today for ongoing prenatal care.  She is currently monitored for the following issues for this high-risk pregnancy and has Closed avulsion fracture of left ankle; Supervision of high risk pregnancy, antepartum; Hypertension; Pre-existing type 2 diabetes affecting pregnancy, antepartum; Pre-existing essential hypertension during pregnancy, antepartum; Alpha thalassemia silent carrier; [redacted] weeks gestation of pregnancy; Polyhydramnios in third trimester; and Polyhydramnios affecting pregnancy in third trimester on their problem list.  Patient doing well with no acute concerns today. She reports no complaints.  Contractions: Irregular. Vag. Bleeding: None.  Movement: Present.  Denies leaking of fluid.   The following portions of the patient's history were reviewed and updated as appropriate: allergies, current medications, past family history, past medical history, past social history, past surgical history and problem list. Problem list updated.  Objective:   Vitals:   01/26/21 1406  BP: 117/79  Pulse: (!) 113  Weight: 293 lb 12.8 oz (133.3 kg)    Fetal Status: Fetal Heart Rate (bpm): NST Fundal Height: 37 cm Movement: Present     General:  Alert, oriented and cooperative. Patient is in no acute distress.  Skin: Skin is warm and dry. No rash noted.   Cardiovascular: Normal heart rate noted  Respiratory: Normal respiratory effort, no problems with respiration noted  Abdomen: Soft, gravid, appropriate for gestational age.  Pain/Pressure: Present     Pelvic: Cervical exam deferred        Extremities: Normal range of motion.  Edema: None  Mental Status:  Normal mood and affect. Normal behavior. Normal judgment and thought content.   Assessment and Plan:  Pregnancy: G3P1011 at [redacted]w[redacted]d  1. Supervision of high risk pregnancy, antepartum Continue routine care  2. Pre-existing type 2 diabetes affecting  pregnancy, antepartum FBS: estimated 93-98 PPBS: 180-200 Meds reviewed   Semglee 50 units q hs Novolog 20 units before each meal Increase novolog to 22 units before each meal, if ineffective can increase again to 24 units, hold there until sen by MD or diabetic specialist   3. Chronic hypertension affecting pregnancy Well controlled with labetalol  4. Obesity in pregnancy, antepartum   5. [redacted] weeks gestation of pregnancy   6. Pre-existing essential hypertension during pregnancy, antepartum   7. Alpha thalassemia silent carrier   Preterm labor symptoms and general obstetric precautions including but not limited to vaginal bleeding, contractions, leaking of fluid and fetal movement were reviewed in detail with the patient.  Please refer to After Visit Summary for other counseling recommendations.   Return in about 1 week (around 02/02/2021) for [facility] & NST/BPP as scheduled, HOB, in person.   Mariel Aloe, MD Faculty Attending Center for [facility]

## 2021-01-26 NOTE — Progress Notes (Signed)

## 2021-01-29 ENCOUNTER — Encounter: Payer: Self-pay | Admitting: Obstetrics and Gynecology

## 2021-01-31 ENCOUNTER — Encounter (HOSPITAL_COMMUNITY): Payer: Self-pay | Admitting: Obstetrics & Gynecology

## 2021-01-31 ENCOUNTER — Telehealth: Payer: Self-pay | Admitting: Lactation Services

## 2021-01-31 ENCOUNTER — Inpatient Hospital Stay (HOSPITAL_COMMUNITY)
Admission: AD | Admit: 2021-01-31 | Discharge: 2021-01-31 | Disposition: A | Discharge status: Home / Self Care | Payer: BC Managed Care – PPO | Source: Ambulatory Visit | Attending: Obstetrics & Gynecology | Admitting: Obstetrics & Gynecology

## 2021-01-31 ENCOUNTER — Other Ambulatory Visit: Payer: Self-pay

## 2021-01-31 DIAGNOSIS — O24119 Pre-existing diabetes mellitus, type 2, in pregnancy, unspecified trimester: Secondary | ICD-10-CM

## 2021-01-31 DIAGNOSIS — Z3A33 33 weeks gestation of pregnancy: Secondary | ICD-10-CM | POA: Diagnosis not present

## 2021-01-31 DIAGNOSIS — B9689 Other specified bacterial agents as the cause of diseases classified elsewhere: Secondary | ICD-10-CM | POA: Diagnosis not present

## 2021-01-31 DIAGNOSIS — O4703 False labor before 37 completed weeks of gestation, third trimester: Secondary | ICD-10-CM | POA: Insufficient documentation

## 2021-01-31 DIAGNOSIS — O368190 Decreased fetal movements, unspecified trimester, not applicable or unspecified: Secondary | ICD-10-CM | POA: Diagnosis not present

## 2021-01-31 DIAGNOSIS — O479 False labor, unspecified: Secondary | ICD-10-CM

## 2021-01-31 DIAGNOSIS — N760 Acute vaginitis: Secondary | ICD-10-CM

## 2021-01-31 DIAGNOSIS — O23593 Infection of other part of genital tract in pregnancy, third trimester: Secondary | ICD-10-CM | POA: Insufficient documentation

## 2021-01-31 DIAGNOSIS — Z3689 Encounter for other specified antenatal screening: Secondary | ICD-10-CM

## 2021-01-31 DIAGNOSIS — I10 Essential (primary) hypertension: Secondary | ICD-10-CM

## 2021-01-31 DIAGNOSIS — O0990 Supervision of high risk pregnancy, unspecified, unspecified trimester: Secondary | ICD-10-CM | POA: Diagnosis not present

## 2021-01-31 DIAGNOSIS — R102 Pelvic and perineal pain: Secondary | ICD-10-CM | POA: Diagnosis not present

## 2021-01-31 DIAGNOSIS — Z3A34 34 weeks gestation of pregnancy: Secondary | ICD-10-CM | POA: Diagnosis not present

## 2021-01-31 LAB — URINALYSIS, ROUTINE W REFLEX MICROSCOPIC
Bilirubin Urine: NEGATIVE
Glucose, UA: 150 mg/dL — AB
Hgb urine dipstick: NEGATIVE
Ketones, ur: 5 mg/dL — AB
Nitrite: NEGATIVE
Protein, ur: 30 mg/dL — AB
Specific Gravity, Urine: 1.026 (ref 1.005–1.030)
pH: 6 (ref 5.0–8.0)

## 2021-01-31 LAB — WET PREP, GENITAL
Clue Cells Wet Prep HPF POC: NONE SEEN
Sperm: NONE SEEN
Trich, Wet Prep: NONE SEEN
WBC, Wet Prep HPF POC: >=10 — AB (ref <10)
Yeast Wet Prep HPF POC: NONE SEEN

## 2021-01-31 LAB — POCT FERN TEST: POCT Fern Test: NEGATIVE

## 2021-01-31 MED ORDER — METRONIDAZOLE 500 MG PO TABS: 500.0000 mg | ORAL_TABLET | Freq: Two times a day (BID) | ORAL | 0 refills | Status: DC

## 2021-01-31 NOTE — MAU Provider Note (Signed)
History   [id number]   Chief Complaint  Patient presents with   Contractions   Pelvic Pressure   Rupture of Membranes    HPI Joanne Waller is a 35 y.o. female  G3P1011 _0 .6 wks here with report of leaking clear fluid today. No gush of fluid. Panty liner had clear fluid on it. Reports vaginal malodor and was treated a few months ago for BV.  Pt reports regular contractions q10 min before arrival, hasn't felt any since. She denies vaginal bleeding. Last intercourse was 1-2 days ago. She reports good fetal movement. All other systems negative.    Patient's last menstrual period was 06/09/2020.  OB History  Gravida Para Term Preterm AB Living  _1 SAB IAB Ectopic Multiple Live Births  1       1    # Outcome Date GA Lbr Len/2nd Weight Sex Delivery Anes PTL Lv  3 Current           2 SAB 10/06/08        DEC  1 Term 03/17/04 [redacted]w[redacted]d 3330 g M Vag-Spont   LIV    Past Medical History:  Diagnosis Date   Diabetes mellitus [place] complication ([place]    Hypertension    Pyelonephritis    Renal disorder    pyelonephritis   [place]     Family History  Problem Relation Age of Onset   Kidney disease Mother    Hypertension Mother    Sickle cell anemia Father    Hyperlipidemia Maternal Grandmother     Social History   Socioeconomic History   Marital status: Significant Other    Spouse name: Not on file   Number of children: Not on file   Years of education: Not on file   Highest education level: Not on file  Occupational History   Not on file  Tobacco Use   Smoking status: Former    Packs/day: 0.50    Years: 7.00    Pack years: 3.50    Types: Cigarettes    Quit date: 06/23/2020    Years since quitting: 0.6   Smokeless tobacco: Never  Vaping Use   Vaping Use: Never used  Substance and Sexual Activity   Alcohol use: Not Currently    Comment: not while preg   Drug use: No   Sexual activity: Yes    Partners: Male    Birth control/protection: None   Other Topics Concern   Not on file  Social History Narrative   Not on file   Social Determinants of Health   Financial Resource Strain: Not on file  Food Insecurity: No Food Insecurity   Worried About Running Out of Food in the Last Year: Never true   Ran Out of Food in the Last Year: Never true  Transportation Needs: No Transportation Needs   Lack of Transportation (Medical): No   Lack of Transportation (Non-Medical): No  Physical Activity: Not on file  Stress: Not on file  Social Connections: Not on file    Allergies  Allergen Reactions   Amoxicillin Other (See Comments)    Pt stated, "I get a severe blinding headache"   Penicillins Other (See Comments)    Sharp pain in temple    Latex Itching, Swelling and Rash    No current facility-administered medications on file prior to encounter.   Current Outpatient Medications on File Prior to Encounter  Medication Sig Dispense Refill   aspirin  EC 81 MG tablet Take 1 tablet (81 mg total) by mouth daily. Swallow whole. 30 tablet 11   Blood Pressure Monitoring (BLOOD PRESSURE KIT) DEVI 1 each by Does not apply route once a week. 1 each 0   labetalol (NORMODYNE) 100 MG tablet Take 100 mg by mouth 2 (two) times daily.     metFORMIN (GLUCOPHAGE) 500 MG tablet Take by mouth 2 (two) times daily with a meal.     ACCU-CHEK GUIDE test strip      Accu-Chek Softclix Lancets lancets daily.     acetaminophen (TYLENOL) 325 MG tablet Take 2 tablets (650 mg total) by mouth every 6 (six) hours as needed. 30 tablet 0   B-D ULTRAFINE III SHORT PEN 31G X 8 MM MISC daily.     Blood Glucose Monitoring Suppl (ACCU-CHEK GUIDE) w/Device KIT      Continuous Blood Gluc Transmit (DEXCOM G6 TRANSMITTER) MISC 1 Device by Does not apply route every 3 (three) months. 1 each 3   insulin aspart (NOVOLOG) 100 UNIT/ML injection Inject 24 Units into the skin daily. 10 mL 12   Insulin Disposable Pump (OMNIPOD DASH PODS, GEN 4,) MISC 1 Device by Does not apply route  every other day. 3 each 6   insulin glargine-yfgn (SEMGLEE) 100 UNIT/ML Pen Inject 50 Units into the skin at bedtime. 15 mL 5   [DISCONTINUED] ipratropium (ATROVENT) 0.06 % nasal spray Place 2 sprays into both nostrils 4 (four) times daily. 15 mL 0     Review of Systems  Gastrointestinal:  Positive for abdominal pain.  Genitourinary:  Positive for vaginal discharge. Negative for dysuria, hematuria, urgency and vaginal bleeding.    Physical Exam   Vitals:   01/31/21 1622 01/31/21 1628 01/31/21 1827  BP:  127/79 128/82  Pulse:  (!) 117 99  Resp:  20 15  Temp:  (!) 97.5 F (36.4 C)   TempSrc:  Oral   SpO2:  98% 98%  Weight: 133.3 kg    Height: 5' 10.5" (1.791 m)      Physical Exam Vitals and nursing note reviewed. Exam conducted with a chaperone present.  Constitutional:      General: She is not in acute distress.    Appearance: Normal appearance.  HENT:     Head: Normocephalic and atraumatic.  Pulmonary:     Effort: Pulmonary effort is normal. No respiratory distress.  Genitourinary:    Comments: SSE: no pool, thin frothy tan discharge  SVE: closed/thick Musculoskeletal:        General: Normal range of motion.     Cervical back: Normal range of motion.  Skin:    General: Skin is warm and dry.  Neurological:     General: No focal deficit present.     Mental Status: She is alert and oriented to person, place, and time.  Psychiatric:        Mood and Affect: Mood normal.        Behavior: Behavior normal.  EFM: 145 bpm, mod variability, + accels, no decels Toco: none  Results for orders placed or performed during the hospital encounter of 01/31/21 (from the past 24 hour(s))  Urinalysis, Routine w reflex microscopic Urine, Clean Catch     Status: Abnormal   Collection Time: 01/31/21  4:54 PM  Result Value Ref Range   Color, Urine YELLOW YELLOW   APPearance HAZY (A) CLEAR   Specific Gravity, Urine 1.026 1.005 - 1.030   pH 6.0 5.0 - 8.0   Glucose, UA  150 (A) NEGATIVE  mg/dL   Hgb urine dipstick NEGATIVE NEGATIVE   Bilirubin Urine NEGATIVE NEGATIVE   Ketones, ur 5 (A) NEGATIVE mg/dL   Protein, ur 30 (A) NEGATIVE mg/dL   Nitrite NEGATIVE NEGATIVE   Leukocytes,Ua SMALL (A) NEGATIVE   RBC / HPF 0-5 0 - 5 RBC/hpf   WBC, UA 11-20 0 - 5 WBC/hpf   Bacteria, UA MANY (A) NONE SEEN   Squamous Epithelial / LPF 11-20 0 - 5   Mucus PRESENT   Wet prep, genital     Status: Abnormal   Collection Time: 01/31/21  5:15 PM  Result Value Ref Range   Yeast Wet Prep HPF POC NONE SEEN NONE SEEN   Trich, Wet Prep NONE SEEN NONE SEEN   Clue Cells Wet Prep HPF POC NONE SEEN NONE SEEN   WBC, Wet Prep HPF POC >=10 (A) <10   Sperm NONE SEEN   POCT fern test     Status: None   Collection Time: 01/31/21  6:35 PM  Result Value Ref Range   POCT Fern Test Negative = intact amniotic membranes    MAU Course  Procedures  MDM Labs ordered and reviewed. No signs of SROM or PTL. Will treat for BV based on sx and clinical exam. UC ordered. Stable for discharge home.  Assessment and Plan  [redacted] weeks gestation NST reactive BH ctx Bacterial vaginosis Discharge home Follow up at [facility] as scheduled PTL precautions Rx Flagyl  Allergies as of 01/31/2021       Reactions   Amoxicillin Other (See Comments)   Pt stated, "I get a severe blinding headache"   Penicillins Other (See Comments)   Sharp pain in temple    Latex Itching, Swelling, Rash        Medication List     TAKE these medications    Accu-Chek Guide test strip Generic drug: glucose blood   Accu-Chek Guide w/Device Kit   Accu-Chek Softclix Lancets lancets daily.   acetaminophen 325 MG tablet Commonly known as: Tylenol Take 2 tablets (650 mg total) by mouth every 6 (six) hours as needed.   aspirin EC 81 MG tablet Take 1 tablet (81 mg total) by mouth daily. Swallow whole.   B-D ULTRAFINE III SHORT PEN 31G X 8 MM Misc Generic drug: Insulin Pen Needle daily.   Blood Pressure Kit Devi 1 each by Does not  apply route once a week.   Dexcom G6 Transmitter Misc 1 Device by Does not apply route every 3 (three) months.   insulin aspart 100 UNIT/ML injection Commonly known as: novoLOG Inject 24 Units into the skin daily.   insulin glargine-yfgn 100 UNIT/ML Pen Commonly known as: SEMGLEE Inject 50 Units into the skin at bedtime.   labetalol 100 MG tablet Commonly known as: NORMODYNE Take 100 mg by mouth 2 (two) times daily.   metFORMIN 500 MG tablet Commonly known as: GLUCOPHAGE Take by mouth 2 (two) times daily with a meal.   metroNIDAZOLE 500 MG tablet Commonly known as: FLAGYL Take 1 tablet (500 mg total) by mouth 2 (two) times daily.   Omnipod DASH Pods (Gen 4) Misc 1 Device by Does not apply route every other day.        Julianne Handler, CNM 01/31/2021 6:35 PM

## 2021-01-31 NOTE — MAU Note (Signed)
Presents with c/o ctxs, pelvic pressure, and watery vaginal discharge.  Reports ctxs are every 10 minutes that getting uncomfortable.  Endorses +FM.  Denies VB.

## 2021-01-31 NOTE — Telephone Encounter (Signed)
Called patient in response to c/o contractions and leaking. She did not answer. LM for her to call the office at her convenience and to check her My Chart message.

## 2021-02-01 ENCOUNTER — Inpatient Hospital Stay (HOSPITAL_COMMUNITY)
Admission: AD | Admit: 2021-02-01 | Discharge: 2021-02-01 | Disposition: A | Discharge status: Home / Self Care | Payer: BC Managed Care – PPO | Attending: Obstetrics & Gynecology | Admitting: Obstetrics & Gynecology

## 2021-02-01 ENCOUNTER — Encounter (HOSPITAL_COMMUNITY): Payer: Self-pay | Admitting: Obstetrics & Gynecology

## 2021-02-01 DIAGNOSIS — E119 Type 2 diabetes mellitus without complications: Secondary | ICD-10-CM | POA: Diagnosis not present

## 2021-02-01 DIAGNOSIS — O24113 Pre-existing diabetes mellitus, type 2, in pregnancy, third trimester: Secondary | ICD-10-CM

## 2021-02-01 DIAGNOSIS — O368130 Decreased fetal movements, third trimester, not applicable or unspecified: Secondary | ICD-10-CM | POA: Insufficient documentation

## 2021-02-01 DIAGNOSIS — O26893 Other specified pregnancy related conditions, third trimester: Secondary | ICD-10-CM | POA: Insufficient documentation

## 2021-02-01 DIAGNOSIS — O24119 Pre-existing diabetes mellitus, type 2, in pregnancy, unspecified trimester: Secondary | ICD-10-CM | POA: Diagnosis not present

## 2021-02-01 DIAGNOSIS — Z3A34 34 weeks gestation of pregnancy: Secondary | ICD-10-CM

## 2021-02-01 DIAGNOSIS — O368190 Decreased fetal movements, unspecified trimester, not applicable or unspecified: Secondary | ICD-10-CM

## 2021-02-01 DIAGNOSIS — O10913 Unspecified pre-existing hypertension complicating pregnancy, third trimester: Secondary | ICD-10-CM | POA: Diagnosis not present

## 2021-02-01 DIAGNOSIS — O0990 Supervision of high risk pregnancy, unspecified, unspecified trimester: Secondary | ICD-10-CM

## 2021-02-01 DIAGNOSIS — O212 Late vomiting of pregnancy: Secondary | ICD-10-CM | POA: Diagnosis not present

## 2021-02-01 DIAGNOSIS — R109 Unspecified abdominal pain: Secondary | ICD-10-CM | POA: Diagnosis not present

## 2021-02-01 DIAGNOSIS — O10919 Unspecified pre-existing hypertension complicating pregnancy, unspecified trimester: Secondary | ICD-10-CM

## 2021-02-01 DIAGNOSIS — O219 Vomiting of pregnancy, unspecified: Secondary | ICD-10-CM

## 2021-02-01 DIAGNOSIS — M549 Dorsalgia, unspecified: Secondary | ICD-10-CM | POA: Insufficient documentation

## 2021-02-01 HISTORY — DX: Depression, unspecified: F32.A

## 2021-02-01 LAB — URINALYSIS, ROUTINE W REFLEX MICROSCOPIC
Bilirubin Urine: NEGATIVE
Glucose, UA: NEGATIVE mg/dL
Hgb urine dipstick: NEGATIVE
Ketones, ur: 80 mg/dL — AB
Nitrite: NEGATIVE
Protein, ur: 100 mg/dL — AB
Specific Gravity, Urine: 1.026 (ref 1.005–1.030)
pH: 6 (ref 5.0–8.0)

## 2021-02-01 LAB — BASIC METABOLIC PANEL
Anion gap: 9 (ref 5–15)
BUN: 5 mg/dL — ABNORMAL LOW (ref 6–20)
CO2: 18 mmol/L — ABNORMAL LOW (ref 22–32)
Calcium: 8.4 mg/dL — ABNORMAL LOW (ref 8.9–10.3)
Chloride: 104 mmol/L (ref 98–111)
Creatinine, Ser: 0.71 mg/dL (ref 0.44–1.00)
GFR, Estimated: >60 mL/min (ref >60)
Glucose, Bld: 106 mg/dL — ABNORMAL HIGH (ref 70–99)
Potassium: 4.3 mmol/L (ref 3.5–5.1)
Sodium: 131 mmol/L — ABNORMAL LOW (ref 135–145)

## 2021-02-01 LAB — CBC
HCT: 35.6 % — ABNORMAL LOW (ref 36.0–46.0)
Hemoglobin: 12.3 g/dL (ref 12.0–15.0)
MCH: 27.4 pg (ref 26.0–34.0)
MCHC: 34.6 g/dL (ref 30.0–36.0)
MCV: 79.3 fL — ABNORMAL LOW (ref 80.0–100.0)
Platelets: 232 10*3/uL (ref 150–400)
RBC: 4.49 MIL/uL (ref 3.87–5.11)
RDW: 12.9 % (ref 11.5–15.5)
WBC: 5 10*3/uL (ref 4.0–10.5)
nRBC: 0 % (ref 0.0–0.2)

## 2021-02-01 LAB — CULTURE, OB URINE: Culture: 10000 — AB

## 2021-02-01 LAB — GC/CHLAMYDIA PROBE AMP (~~LOC~~) NOT AT ARMC
Chlamydia: NEGATIVE
Comment: NEGATIVE
Comment: NORMAL
Neisseria Gonorrhea: NEGATIVE

## 2021-02-01 MED ORDER — ONDANSETRON HCL 4 MG/2ML IJ SOLN
4.0000 mg | Freq: Once | INTRAMUSCULAR | Status: AC
  Administered 2021-02-01: 17:00:00 4 mg via INTRAVENOUS
  Filled 2021-02-01: qty 2

## 2021-02-01 MED ORDER — LACTATED RINGERS IV BOLUS
1000.0000 mL | Freq: Once | INTRAVENOUS | Status: AC
  Administered 2021-02-01: 17:00:00 1000 mL via INTRAVENOUS

## 2021-02-01 NOTE — MAU Provider Note (Signed)
History     CSN: [id number]  Arrival date and time: 02/01/21 1522  None    Chief Complaint  Patient presents with   Contractions   Back Pain   Emesis   Decreased Fetal Movement   HPI Joanne Waller is 35 y.o. G3P1011 at [redacted]w[redacted]d who presents to MAU for contractions, nausea/vomiting, and decreased fetal movement. Pregnancy is significant for cHTN and T2DM. Patient reports contractions started yesterday and have progressively gotten more painful and are more in her back. She has not been timing the frequency of contractions. She denies leaking fluid or vaginal bleeding. She reports that nausea and vomiting started at 0400 and she has had 3 episodes of emesis. She has not taken any medications. She thinks she is dehydrated. She was able to eat a few spoonfuls of soup this morning around 1000-1100. She has not checked her blood sugar or taken her medications. She also reports decreased fetal movement, although has felt baby move since arrival to MAU. She denies headache, vision changes, or RUQ pain.   OB History     Gravida  3   Para  1   Term  1   Preterm      AB  1   Living  1      SAB  1   IAB      Ectopic      Multiple      Live Births  1           Past Medical History:  Diagnosis Date   Depression    Diabetes mellitus without complication (HCC)    Hypertension    Pyelonephritis    Renal disorder    pyelonephritis   Seizures (HCC)    none since age 9    Past Surgical History:  Procedure Laterality Date   CHOLECYSTECTOMY     DILATION AND CURETTAGE OF UTERUS      Family History  Problem Relation Age of Onset   Kidney disease Mother    Hypertension Mother    Sickle cell anemia Father    Hyperlipidemia Maternal Grandmother     Social History   Tobacco Use   Smoking status: Former    Packs/day: 0.50    Years: 7.00    Pack years: 3.50    Types: Cigarettes    Quit date: 06/23/2020    Years since quitting: 0.6   Smokeless tobacco: Never  Vaping  Use   Vaping Use: Never used  Substance Use Topics   Alcohol use: Not Currently    Comment: not while preg   Drug use: No    Allergies:  Allergies  Allergen Reactions   Amoxicillin Other (See Comments)    Pt stated, "I get a severe blinding headache"   Penicillins Other (See Comments)    Sharp pain in temple    Latex Itching, Swelling and Rash    No medications prior to admission.    Review of Systems  Constitutional: Negative.   Respiratory: Negative.    Gastrointestinal:  Positive for abdominal pain, nausea and vomiting. Negative for diarrhea.  Genitourinary: Negative.   Musculoskeletal:  Positive for back pain.  Neurological: Negative.    Physical Exam   Blood pressure 134/74, pulse (!) 123, temperature 98.5 F (36.9 C), temperature source Oral, resp. rate 20, last menstrual period 06/09/2020, SpO2 98 %.  Physical Exam Vitals and nursing note reviewed.  Constitutional:      General: She is not in acute distress.    Appearance:  She is obese.  Eyes:     Extraocular Movements: Extraocular movements intact.     Conjunctiva/sclera: Conjunctivae normal.     Pupils: Pupils are equal, round, and reactive to light.  Cardiovascular:     Rate and Rhythm: Tachycardia present.  Pulmonary:     Effort: Pulmonary effort is normal.  Abdominal:     Palpations: Abdomen is soft.     Tenderness: There is no abdominal tenderness.  Genitourinary:    Comments: SVE: closed/thick/ballotable Musculoskeletal:        General: Normal range of motion.     Cervical back: Normal range of motion.  Skin:    General: Skin is warm and dry.  Neurological:     General: No focal deficit present.     Mental Status: She is alert and oriented to person, place, and time.  Psychiatric:        Mood and Affect: Mood normal.        Behavior: Behavior normal.        Thought Content: Thought content normal.        Judgment: Judgment normal.   NST FHR: 150 bpm, moderate variability, +15x15 accels,  no decels Toco: q3-7  MAU Course  Procedures NST UA CBC, BMP IVF bolus, Zofran IV  MDM UA with >80 ketones, labs wnl. Glucose 106 so I do not suspect DKA. Cervix closed/thick. Patient reports feeling "a lot of fetal movement" and reports pain improved after IVF bolus. Contractions initially 3-7 mins, irregular after fluids. Both nausea and pain improved. No episodes of emesis during stay.   Assessment and Plan  [redacted] weeks gestation of pregnancy Nausea in pregnancy Chronic hypertension Type 2 diabetes mellitus Decreased fetal movement  - Discharge home in stable condition - Discussed importance of eating small, frequent meals and taking medications as prescribed - Keep OB appointment as scheduled tomorrow 02/02/2021 - Strict return precautions reviewed. Return to MAU as needed or for worsening symptoms    Brand Males, CNM 02/01/2021, 7:30 PM

## 2021-02-01 NOTE — MAU Note (Addendum)
Feeling contractions, stronger and more uncomfortable than yesterday.  Did not time them, they don't seem to stop.  Now is also having pain in her low back.  Denies  bleeding or leaking.  Has been throwing up today, thinks she is dehydrated.  Baby isn't moving as much

## 2021-02-02 ENCOUNTER — Ambulatory Visit (INDEPENDENT_AMBULATORY_CARE_PROVIDER_SITE_OTHER): Payer: BC Managed Care – PPO | Admitting: Family Medicine

## 2021-02-02 ENCOUNTER — Other Ambulatory Visit: Payer: Self-pay

## 2021-02-02 ENCOUNTER — Ambulatory Visit (INDEPENDENT_AMBULATORY_CARE_PROVIDER_SITE_OTHER): Payer: BC Managed Care – PPO

## 2021-02-02 ENCOUNTER — Ambulatory Visit: Payer: BC Managed Care – PPO | Admitting: *Deleted

## 2021-02-02 VITALS — BP 126/89 | HR 85 | Wt 292.5 lb

## 2021-02-02 DIAGNOSIS — O99210 Obesity complicating pregnancy, unspecified trimester: Secondary | ICD-10-CM

## 2021-02-02 DIAGNOSIS — O10919 Unspecified pre-existing hypertension complicating pregnancy, unspecified trimester: Secondary | ICD-10-CM | POA: Diagnosis not present

## 2021-02-02 DIAGNOSIS — O24119 Pre-existing diabetes mellitus, type 2, in pregnancy, unspecified trimester: Secondary | ICD-10-CM

## 2021-02-02 DIAGNOSIS — Z0289 Encounter for other administrative examinations: Secondary | ICD-10-CM

## 2021-02-02 DIAGNOSIS — O403XX0 Polyhydramnios, third trimester, not applicable or unspecified: Secondary | ICD-10-CM

## 2021-02-02 DIAGNOSIS — K219 Gastro-esophageal reflux disease without esophagitis: Secondary | ICD-10-CM

## 2021-02-02 DIAGNOSIS — O0990 Supervision of high risk pregnancy, unspecified, unspecified trimester: Secondary | ICD-10-CM

## 2021-02-02 DIAGNOSIS — O10019 Pre-existing essential hypertension complicating pregnancy, unspecified trimester: Secondary | ICD-10-CM

## 2021-02-02 MED ORDER — OMEPRAZOLE MAGNESIUM 20 MG PO TBEC: 20.0000 mg | DELAYED_RELEASE_TABLET | Freq: Every day | ORAL | 3 refills | Status: DC

## 2021-02-02 MED ORDER — INSULIN GLARGINE-YFGN 100 UNIT/ML ~~LOC~~ SOPN: 52.0000 [IU] | PEN_INJECTOR | Freq: Every day | SUBCUTANEOUS | 5 refills | Status: DC

## 2021-02-02 MED ORDER — INSULIN ASPART 100 UNIT/ML IJ SOLN: 26.0000 [IU] | Freq: Every day | INTRAMUSCULAR | 12 refills | Status: DC

## 2021-02-02 NOTE — Patient Instructions (Signed)

## 2021-02-02 NOTE — Progress Notes (Signed)
° °  PRENATAL VISIT NOTE  Subjective:  Joanne Waller is a 35 y.o. G3P1011 at [redacted]w[redacted]d being seen today for ongoing prenatal care.  She is currently monitored for the following issues for this high-risk pregnancy and has Closed avulsion fracture of left ankle; Supervision of high risk pregnancy, antepartum; Hypertension; Pre-existing type 2 diabetes affecting pregnancy, antepartum; Pre-existing essential hypertension during pregnancy, antepartum; Alpha thalassemia silent carrier; Polyhydramnios in third trimester; and Polyhydramnios affecting pregnancy in third trimester on their problem list.  Patient reports nausea and vomiting. Has not taken insulin or BP meds in several days. Contractions: Irregular.  .  Movement: Present. Denies leaking of fluid.   The following portions of the patient's history were reviewed and updated as appropriate: allergies, current medications, past family history, past medical history, past social history, past surgical history and problem list.   Objective:   Vitals:   02/02/21 1425  BP: 126/89  Pulse: 85  Weight: 292 lb 8 oz (132.7 kg)    Fetal Status: Fetal Heart Rate (bpm): NST   Movement: Present     General:  Alert, oriented and cooperative. Patient is in no acute distress.  Skin: Skin is warm and dry. No rash noted.   Cardiovascular: Normal heart rate noted  Respiratory: Normal respiratory effort, no problems with respiration noted  Abdomen: Soft, gravid, appropriate for gestational age.  Pain/Pressure: Present     Pelvic: Cervical exam deferred        Extremities: Normal range of motion.     Mental Status: Normal mood and affect. Normal behavior. Normal judgment and thought content.  NST:  Baseline: 135 bpm, Variability: Good {> 6 bpm), Accelerations: Reactive, and Decelerations: Absent  Assessment and Plan:  Pregnancy: G3P1011 at [redacted]w[redacted]d 1. Supervision of high risk pregnancy, antepartum Cultures next visit  2. Pre-existing type 2 diabetes affecting  pregnancy, antepartum No book, left log in husband's car CBGs are ok, highest in 153, not hitting 200 When eating, please increase insulin as follows - insulin glargine-yfgn (SEMGLEE) 100 UNIT/ML Pen; Inject 52 Units into the skin at bedtime.  Dispense: 15 mL; Refill: 5 - insulin aspart (NOVOLOG) 100 UNIT/ML injection; Inject 26 Units into the skin daily.  Dispense: 10 mL; Refill: 12  3. Chronic hypertension affecting pregnancy BP is well controlled on Labetalol  4. Obesity in pregnancy, antepartum   5. Pre-existing essential hypertension during pregnancy, antepartum   6. Polyhydramnios affecting pregnancy in third trimester Resolved today  7. Gastroesophageal reflux disease without esophagitis Add PPI will likely help with nausea - omeprazole (PRILOSEC OTC) 20 MG tablet; Take 1 tablet (20 mg total) by mouth daily.  Dispense: 30 tablet; Refill: 3  Preterm labor symptoms and general obstetric precautions including but not limited to vaginal bleeding, contractions, leaking of fluid and fetal movement were reviewed in detail with the patient. Please refer to After Visit Summary for other counseling recommendations.   Return in about 6 days (around 02/08/2021) for [facility], NST/BPP as scheduled.  Future Appointments  Date Time Provider Department Center  02/08/2021  2:15 PM [facility] NST [facility]  02/08/2021  4:15 PM Warden Fillers, MD [facility]  02/13/2021 11:00 AM [facility] NURSE [facility]  02/13/2021 11:15 AM [facility] US2 [facility]    Reva Bores, MD

## 2021-02-02 NOTE — Progress Notes (Signed)
Pt had visit to MAU on 12/27 d/t fluid leaking and UC's.  She also had MAU visit yesterday d/t decreased FM, nausea, vomiting and dehydration.  Pt has not taken insulin or Labetalol x2 days due to nausea and not eating much food.

## 2021-02-02 NOTE — Progress Notes (Signed)

## 2021-02-03 ENCOUNTER — Encounter: Payer: Self-pay | Admitting: Family Medicine

## 2021-02-03 ENCOUNTER — Encounter: Payer: Self-pay | Admitting: Lactation Services

## 2021-02-05 ENCOUNTER — Other Ambulatory Visit: Payer: Self-pay

## 2021-02-05 ENCOUNTER — Encounter (HOSPITAL_COMMUNITY): Payer: Self-pay | Admitting: Obstetrics and Gynecology

## 2021-02-05 ENCOUNTER — Inpatient Hospital Stay (HOSPITAL_COMMUNITY)
Admission: AD | Admit: 2021-02-05 | Discharge: 2021-02-05 | Disposition: A | Discharge status: Home / Self Care | Payer: BC Managed Care – PPO | Attending: Obstetrics and Gynecology | Admitting: Obstetrics and Gynecology

## 2021-02-05 DIAGNOSIS — O98513 Other viral diseases complicating pregnancy, third trimester: Secondary | ICD-10-CM | POA: Insufficient documentation

## 2021-02-05 DIAGNOSIS — Z20822 Contact with and (suspected) exposure to covid-19: Secondary | ICD-10-CM | POA: Insufficient documentation

## 2021-02-05 DIAGNOSIS — Z3A34 34 weeks gestation of pregnancy: Secondary | ICD-10-CM | POA: Diagnosis not present

## 2021-02-05 DIAGNOSIS — U071 COVID-19: Secondary | ICD-10-CM

## 2021-02-05 LAB — URINALYSIS, ROUTINE W REFLEX MICROSCOPIC
Bilirubin Urine: NEGATIVE
Glucose, UA: NEGATIVE mg/dL
Hgb urine dipstick: NEGATIVE
Ketones, ur: NEGATIVE mg/dL
Nitrite: NEGATIVE
Protein, ur: 100 mg/dL — AB
Specific Gravity, Urine: 1.023 (ref 1.005–1.030)
pH: 6 (ref 5.0–8.0)

## 2021-02-05 LAB — RESP PANEL BY RT-PCR (FLU A&B, COVID) ARPGX2
Influenza A by PCR: NEGATIVE
Influenza B by PCR: NEGATIVE
SARS Coronavirus 2 by RT PCR: POSITIVE — AB

## 2021-02-05 MED ORDER — LACTATED RINGERS IV BOLUS
1000.0000 mL | Freq: Once | INTRAVENOUS | Status: AC
  Administered 2021-02-05: 1000 mL via INTRAVENOUS

## 2021-02-05 MED ORDER — ONDANSETRON HCL 4 MG/2ML IJ SOLN
4.0000 mg | Freq: Once | INTRAMUSCULAR | Status: AC
Start: 2021-02-05 — End: 2021-02-05
  Administered 2021-02-05: 4 mg via INTRAVENOUS
  Filled 2021-02-05: qty 2

## 2021-02-05 NOTE — Discharge Instructions (Signed)

## 2021-02-05 NOTE — MAU Provider Note (Addendum)
History     CSN: [id number]  Arrival date and time: 02/05/21 1727   Event Date/Time   First Provider Initiated Contact with Patient 02/05/21 1917      Chief Complaint  Patient presents with   +Covid Test   HPI [name] is a 36 y.o. year old G69P1011 female at 6w4dweeks gestation who presents to MAU reporting husband catching COVID while at work. She tested (+) using 2 at home tests. She reports having sneezing, coughing, congestion, runny nose, and nausea; "but all mild sx's." She denies vomiting or fever; unlike her husband. She denies and VB or LOF. She has been having UC's since last week. She was seen in MAU last week for UC's, but not dilated.   She was unsure of what she could take OTC, so she has not taken anything for her sx's. [facility]; next appt 02/08/2021.  OB History     Gravida  3   Para  1   Term  1   Preterm      AB  1   Living  1      SAB  1   IAB      Ectopic      Multiple      Live Births  1           Past Medical History:  Diagnosis Date   Depression    Diabetes mellitus without complication ([facility]    Hypertension    Pyelonephritis    Renal disorder    pyelonephritis   Seizures ([facility]    none since age 36   Past Surgical History:  Procedure Laterality Date   CHOLECYSTECTOMY     DILATION AND CURETTAGE OF UTERUS      Family History  Problem Relation Age of Onset   Kidney disease Mother    Hypertension Mother    Sickle cell anemia Father    Hyperlipidemia Maternal Grandmother     Social History   Tobacco Use   Smoking status: Former    Packs/day: 0.50    Years: 7.00    Pack years: 3.50    Types: Cigarettes    Quit date: 06/23/2020    Years since quitting: 0.6   Smokeless tobacco: Never  Vaping Use   Vaping Use: Never used  Substance Use Topics   Alcohol use: Not Currently    Comment: not while preg   Drug use: No    Allergies:  Allergies  Allergen Reactions   Amoxicillin  Other (See Comments)    Pt stated, "I get a severe blinding headache"   Penicillins Other (See Comments)    Sharp pain in temple    Latex Itching, Swelling and Rash    Medications Prior to Admission  Medication Sig Dispense Refill Last Dose   ACCU-CHEK GUIDE test strip       Accu-Chek Softclix Lancets lancets daily.      acetaminophen (TYLENOL) 325 MG tablet Take 2 tablets (650 mg total) by mouth every 6 (six) hours as needed. 30 tablet 0    aspirin EC 81 MG tablet Take 1 tablet (81 mg total) by mouth daily. Swallow whole. 30 tablet 11    B-D ULTRAFINE III SHORT PEN 31G X 8 MM MISC daily.      Blood Glucose Monitoring Suppl (ACCU-CHEK GUIDE) w/Device KIT       Blood Pressure Monitoring (BLOOD PRESSURE KIT) DEVI 1 each by Does not apply route once  a week. 1 each 0    Continuous Blood Gluc Transmit (DEXCOM G6 TRANSMITTER) MISC 1 Device by Does not apply route every 3 (three) months. 1 each 3    insulin aspart (NOVOLOG) 100 UNIT/ML injection Inject 26 Units into the skin daily. 10 mL 12    Insulin Disposable Pump (OMNIPOD DASH PODS, GEN 4,) MISC 1 Device by Does not apply route every other day. (Patient not taking: Reported on 02/02/2021) 3 each 6    insulin glargine-yfgn (SEMGLEE) 100 UNIT/ML Pen Inject 52 Units into the skin at bedtime. 15 mL 5    labetalol (NORMODYNE) 100 MG tablet Take 100 mg by mouth 2 (two) times daily. (Patient not taking: Reported on 02/02/2021)      metFORMIN (GLUCOPHAGE) 500 MG tablet Take by mouth 2 (two) times daily with a meal. (Patient not taking: Reported on 02/02/2021)      metroNIDAZOLE (FLAGYL) 500 MG tablet Take 1 tablet (500 mg total) by mouth 2 (two) times daily. (Patient not taking: Reported on 02/02/2021) 14 tablet 0    omeprazole (PRILOSEC OTC) 20 MG tablet Take 1 tablet (20 mg total) by mouth daily. 30 tablet 3     Review of Systems  Constitutional: Negative.   HENT:  Positive for congestion, rhinorrhea and sneezing.   Eyes: Negative.    Respiratory:  Positive for cough.   Cardiovascular: Negative.   Gastrointestinal: Negative.   Endocrine: Negative.   Genitourinary:  Positive for pelvic pain (contractions).  Musculoskeletal: Negative.   Skin: Negative.   Allergic/Immunologic: Negative.   Neurological: Negative.   Hematological: Negative.   Psychiatric/Behavioral: Negative.    Physical Exam   Blood pressure 138/88, pulse 100, temperature 97.8 F (36.6 C), temperature source Oral, resp. rate 20, height '5\' 10"'  (1.778 m), weight 132.3 kg, last menstrual period 06/09/2020, SpO2 96 %.  Physical Exam Constitutional:      Appearance: Normal appearance. She is obese.  Cardiovascular:     Rate and Rhythm: Normal rate.     Pulses: Normal pulses.  Abdominal:     Palpations: Abdomen is soft.  Genitourinary:    General: Normal vulva.     Comments: Dilation: 1 Effacement (%): 40 Cervical Position: Posterior Station: Ballotable Presentation: Vertex Exam by:: Sunday Corn, CNM  Musculoskeletal:        General: Normal range of motion.     Cervical back: Normal range of motion.  Skin:    General: Skin is warm and dry.  Neurological:     Mental Status: She is alert and oriented to person, place, and time.  Psychiatric:        Mood and Affect: Mood normal.        Behavior: Behavior normal.        Thought Content: Thought content normal.        Judgment: Judgment normal.   REACTIVE NST - FHR: 150 bpm / moderate variability / accels present / decels absent / TOCO: regular every 3-4 mins  MAU Course  Procedures  MDM CCUA UCx -- Results pending  COVID/flu swab LR 1000 ml bolus @ 999 mL/hr Zofran 4 mg IVPB  Results for orders placed or performed during the hospital encounter of 02/05/21 (from the past 24 hour(s))  Urinalysis, Routine w reflex microscopic Urine, Clean Catch     Status: Abnormal   Collection Time: 02/05/21  5:49 PM  Result Value Ref Range   Color, Urine AMBER (A) YELLOW   APPearance CLOUDY (A) CLEAR    Specific Gravity,  Urine 1.023 1.005 - 1.030   pH 6.0 5.0 - 8.0   Glucose, UA NEGATIVE NEGATIVE mg/dL   Hgb urine dipstick NEGATIVE NEGATIVE   Bilirubin Urine NEGATIVE NEGATIVE   Ketones, ur NEGATIVE NEGATIVE mg/dL   Protein, ur 100 (A) NEGATIVE mg/dL   Nitrite NEGATIVE NEGATIVE   Leukocytes,Ua SMALL (A) NEGATIVE   RBC / HPF 6-10 0 - 5 RBC/hpf   WBC, UA 11-20 0 - 5 WBC/hpf   Bacteria, UA MANY (A) NONE SEEN   Squamous Epithelial / LPF 21-50 0 - 5   Mucus PRESENT   Resp Panel by RT-PCR (Flu A&B, Covid) Nasopharyngeal Swab     Status: Abnormal   Collection Time: 02/05/21  6:20 PM   Specimen: Nasopharyngeal Swab; Nasopharyngeal(NP) swabs in vial transport medium  Result Value Ref Range   SARS Coronavirus 2 by RT PCR POSITIVE (A) NEGATIVE   Influenza A by PCR NEGATIVE NEGATIVE   Influenza B by PCR NEGATIVE NEGATIVE    Report given to and care assumed by Jorje Guild, FNP @ [address], CNM 02/05/2021, 7:18 PM   Cervix unchanged after monitoring. Patient stable & ready for discharge. Discussed OTC meds for symptomatic treatment & quarantine.  Patient declines paxlovid.  Assessment and Plan   1. COVID-19 affecting pregnancy in third trimester   2. [redacted] weeks gestation of pregnancy    -discussed reasons to return to MAU  Jorje Guild, NP

## 2021-02-05 NOTE — L&D Delivery Note (Signed)
Delivery Note After a few increases in pitocin, MVUs were adequate and progress was steady.  She received an amnioinfusion for about an hour d/t repetititve variable decelerations.  She felt an urge to push, and was noted to be C/C/+2.  After a brief 2nd stage, at 7:53 PM a viable female was delivered via Vaginal, Spontaneous (Presentation: Left Occiput Anterior).  APGAR: 8, 8; weight pending. After 1 minute, the cord was clamped and cut. 40 units of pitocin diluted in 1000cc LR was infused rapidly IV.  The placenta separated spontaneously and delivered via CCT and maternal pushing effort.  It was inspected and appears to be intact with a 3 VC.   Anesthesia: Epidural Episiotomy: None Lacerations: 2nd degree;Perineal Suture Repair: 3.0 chromic Est. Blood Loss (mL): 350  Mom to postpartum.  Baby to Couplet care / Skin to Skin  The above was performed by Dr. Donnetta Simpers under my direct supervision and guidance.  Joanne Waller 02/23/2021, 8:38 PM

## 2021-02-05 NOTE — MAU Note (Signed)
Presents stating she had a +Covid test at home today and wanted to be evaluated.  Reports sneezing, coughing, congestion, and weakness.   Denies VB or LOF.  Endorses +FM.

## 2021-02-06 LAB — CULTURE, OB URINE

## 2021-02-07 ENCOUNTER — Telehealth: Payer: Self-pay

## 2021-02-07 NOTE — Telephone Encounter (Signed)
Patient called front office to notify our office that she tested positive for COVID on 02/05/21. Symptoms began 02/04/21. Pt is currently having weekly antenatal testing for chronic hypertension and preexisting diabetes. Last NST/BPP completed in office on 02/02/21 during prenatal appt. Pt was seen in MAU on 02/06/20 and has reactice NST. Reviewed with Crissie Reese, MD who states NST/BPP may be cancelled tomorrow. Pt's visit to be changed to virtual. Pt should keep appt with MFM for 02/13/21 for follow-up US and BPP.

## 2021-02-08 ENCOUNTER — Telehealth: Payer: BC Managed Care – PPO | Admitting: Obstetrics and Gynecology

## 2021-02-08 ENCOUNTER — Other Ambulatory Visit: Payer: BC Managed Care – PPO

## 2021-02-08 ENCOUNTER — Other Ambulatory Visit: Payer: Self-pay

## 2021-02-08 DIAGNOSIS — O219 Vomiting of pregnancy, unspecified: Secondary | ICD-10-CM

## 2021-02-08 MED ORDER — PROMETHAZINE HCL 25 MG PO TABS: 25.0000 mg | ORAL_TABLET | Freq: Four times a day (QID) | ORAL | 0 refills | Status: DC | PRN

## 2021-02-09 ENCOUNTER — Encounter: Payer: Self-pay | Admitting: Radiology

## 2021-02-09 ENCOUNTER — Encounter: Payer: Self-pay | Admitting: Family Medicine

## 2021-02-13 ENCOUNTER — Other Ambulatory Visit: Payer: Self-pay | Admitting: Obstetrics

## 2021-02-13 ENCOUNTER — Encounter: Payer: Self-pay | Admitting: *Deleted

## 2021-02-13 ENCOUNTER — Encounter: Payer: Self-pay | Admitting: Family Medicine

## 2021-02-13 ENCOUNTER — Other Ambulatory Visit: Payer: Self-pay | Admitting: *Deleted

## 2021-02-13 ENCOUNTER — Ambulatory Visit (HOSPITAL_BASED_OUTPATIENT_CLINIC_OR_DEPARTMENT_OTHER): Payer: BC Managed Care – PPO | Admitting: Obstetrics and Gynecology

## 2021-02-13 ENCOUNTER — Ambulatory Visit: Payer: BC Managed Care – PPO

## 2021-02-13 ENCOUNTER — Ambulatory Visit: Payer: BC Managed Care – PPO | Attending: Obstetrics

## 2021-02-13 ENCOUNTER — Ambulatory Visit: Payer: BC Managed Care – PPO | Admitting: *Deleted

## 2021-02-13 ENCOUNTER — Other Ambulatory Visit: Payer: Self-pay

## 2021-02-13 VITALS — BP 136/91 | HR 86

## 2021-02-13 DIAGNOSIS — O10913 Unspecified pre-existing hypertension complicating pregnancy, third trimester: Secondary | ICD-10-CM

## 2021-02-13 DIAGNOSIS — O24113 Pre-existing diabetes mellitus, type 2, in pregnancy, third trimester: Secondary | ICD-10-CM

## 2021-02-13 DIAGNOSIS — O24119 Pre-existing diabetes mellitus, type 2, in pregnancy, unspecified trimester: Secondary | ICD-10-CM

## 2021-02-13 DIAGNOSIS — O99213 Obesity complicating pregnancy, third trimester: Secondary | ICD-10-CM | POA: Insufficient documentation

## 2021-02-13 DIAGNOSIS — O403XX0 Polyhydramnios, third trimester, not applicable or unspecified: Secondary | ICD-10-CM

## 2021-02-13 DIAGNOSIS — O0990 Supervision of high risk pregnancy, unspecified, unspecified trimester: Secondary | ICD-10-CM | POA: Insufficient documentation

## 2021-02-13 DIAGNOSIS — E669 Obesity, unspecified: Secondary | ICD-10-CM

## 2021-02-13 DIAGNOSIS — O10013 Pre-existing essential hypertension complicating pregnancy, third trimester: Secondary | ICD-10-CM | POA: Diagnosis not present

## 2021-02-13 DIAGNOSIS — Z3A35 35 weeks gestation of pregnancy: Secondary | ICD-10-CM | POA: Insufficient documentation

## 2021-02-13 DIAGNOSIS — E119 Type 2 diabetes mellitus without complications: Secondary | ICD-10-CM

## 2021-02-13 DIAGNOSIS — O10919 Unspecified pre-existing hypertension complicating pregnancy, unspecified trimester: Secondary | ICD-10-CM

## 2021-02-13 NOTE — Progress Notes (Signed)
Maternal-Fetal Medicine   Name: Joanne Waller DOB: Jun 21, 1985 MRN: [id number] Referring Provider: Tinnie Gens, MD  I had the pleasure of seeing [facility] at [facility].  She return for fetal growth assessment and antenatal testing.  Her pregnancy is complicated by pregestational diabetes.  Patient reports she has not been checking her blood glucose regularly.  Her fasting levels are in the low 100s and postprandial levels are reportedly normal.  Patient takes insulin glargine 52 units at night and NovoLog 26 units with meals. She has chronic hypertension and takes labetalol and blood pressure today at her office is 136/91 mmHg.  She does not have symptoms and signs of severe features of preeclampsia.  Ultrasound On today's ultrasound, severe polyhydramnios (AFI 34 cm) is seen.  Cephalic presentation.  The estimated fetal weight is at the 98th percentile and the abdominal circumference measurement is at the 99th percentile.  Antenatal testing is reassuring.  BPP 8/8.  I recommended NST monitoring today but the patient could not stay because of other commitments.  She reports good fetal movements.  Our concerns include Pregestational diabetes Patient has been irregular in checking her blood glucose and seems to have suboptimal out for control of diabetes.  I explained the finding of polyhydramnios and fetal macrosomia.  Ultrasound has limitations in accurately estimating fetal weights.  Both polyhydramnios and macrosomia are associated with increased risk of stillbirth. I encouraged her to check her blood glucose regularly. Timing of delivery: Because of poor control, I recommend delivery at [redacted] weeks gestation.  Both hypertension and polyhydramnios can increase the risk of perinatal mortality. Patient agreed with my recommendations and will be making a prenatal visit appointment now.  Recommendations -An appointment was made for her to return next week for BPP and  NST. -Delivery at 37 weeks' gestation.   Thank you for consultation.  If you have any questions or concerns, please contact me the [facility].  Consultation including face-to-face (more than 50%) counseling 20 minutes.

## 2021-02-15 ENCOUNTER — Other Ambulatory Visit: Payer: Self-pay

## 2021-02-15 ENCOUNTER — Other Ambulatory Visit (HOSPITAL_COMMUNITY)
Admission: RE | Admit: 2021-02-15 | Discharge: 2021-02-15 | Disposition: A | Discharge status: Home / Self Care | Payer: BC Managed Care – PPO | Source: Ambulatory Visit | Attending: Obstetrics and Gynecology | Admitting: Obstetrics and Gynecology

## 2021-02-15 ENCOUNTER — Encounter: Payer: Self-pay | Admitting: Obstetrics and Gynecology

## 2021-02-15 ENCOUNTER — Other Ambulatory Visit: Payer: Self-pay | Admitting: Advanced Practice Midwife

## 2021-02-15 ENCOUNTER — Ambulatory Visit (INDEPENDENT_AMBULATORY_CARE_PROVIDER_SITE_OTHER): Payer: BC Managed Care – PPO | Admitting: Obstetrics and Gynecology

## 2021-02-15 ENCOUNTER — Encounter: Payer: Self-pay | Admitting: *Deleted

## 2021-02-15 VITALS — BP 140/91 | HR 82 | Wt 302.2 lb

## 2021-02-15 DIAGNOSIS — O0993 Supervision of high risk pregnancy, unspecified, third trimester: Secondary | ICD-10-CM | POA: Insufficient documentation

## 2021-02-15 DIAGNOSIS — O3663X0 Maternal care for excessive fetal growth, third trimester, not applicable or unspecified: Secondary | ICD-10-CM

## 2021-02-15 DIAGNOSIS — Z3A36 36 weeks gestation of pregnancy: Secondary | ICD-10-CM

## 2021-02-15 DIAGNOSIS — O0990 Supervision of high risk pregnancy, unspecified, unspecified trimester: Secondary | ICD-10-CM

## 2021-02-15 DIAGNOSIS — O3660X0 Maternal care for excessive fetal growth, unspecified trimester, not applicable or unspecified: Secondary | ICD-10-CM | POA: Insufficient documentation

## 2021-02-15 DIAGNOSIS — O10019 Pre-existing essential hypertension complicating pregnancy, unspecified trimester: Secondary | ICD-10-CM

## 2021-02-15 DIAGNOSIS — O24119 Pre-existing diabetes mellitus, type 2, in pregnancy, unspecified trimester: Secondary | ICD-10-CM

## 2021-02-15 DIAGNOSIS — O403XX0 Polyhydramnios, third trimester, not applicable or unspecified: Secondary | ICD-10-CM

## 2021-02-15 LAB — OB RESULTS CONSOLE GC/CHLAMYDIA: Gonorrhea: NEGATIVE

## 2021-02-15 NOTE — Progress Notes (Signed)
° ° °  PRENATAL VISIT NOTE  Subjective:  Joanne Waller is a 36 y.o. G3P1011 at [redacted]w[redacted]d being seen today for ongoing prenatal care.  She is currently monitored for the following issues for this high-risk pregnancy and has Supervision of high risk pregnancy, antepartum; Pre-existing type 2 diabetes affecting pregnancy, antepartum; Pre-existing essential hypertension during pregnancy, antepartum; Alpha thalassemia silent carrier; Polyhydramnios in third trimester; and LGA (large for gestational age) fetus affecting management of mother on their problem list.  Patient reports no complaints.  Contractions: Irritability. Vag. Bleeding: None.  Movement: Present. Denies leaking of fluid.   The following portions of the patient's history were reviewed and updated as appropriate: allergies, current medications, past family history, past medical history, past social history, past surgical history and problem list.   Objective:   Vitals:   02/15/21 1611  BP: (!) 140/91  Pulse: 82  Weight: (!) 302 lb 3.2 oz (137.1 kg)    Fetal Status: Fetal Heart Rate (bpm): 137   Movement: Present  Presentation: Vertex  General:  Alert, oriented and cooperative. Patient is in no acute distress.  Skin: Skin is warm and dry. No rash noted.   Cardiovascular: Normal heart rate noted  Respiratory: Normal respiratory effort, no problems with respiration noted  Abdomen: Soft, gravid, appropriate for gestational age.  Pain/Pressure: Present     Pelvic: Exam done with chaperone present.  Dilation: 1 Effacement (%): 50 Station: Ballotable  Extremities: Normal range of motion.  Edema: None  Mental Status: Normal mood and affect. Normal behavior. Normal judgment and thought content.   Assessment and Plan:  Pregnancy: G3P1011 at [redacted]w[redacted]d 1. [redacted] weeks gestation of pregnancy - Strep Gp B Culture+Rflx - GC/Chlamydia probe amp ([place] [facility])not at [facility]  2. Pre-existing essential hypertension during pregnancy, antepartum Continue  labetalol 100 bid and low dose asa  3. Pre-existing type 2 diabetes affecting pregnancy, antepartum Pt on semglee 20 qhs and aspart 20 with food and metformin 500 w/ breakfast and lunch. Sugars normal. Pt had 1/9 u/s that showed poly and LGA and recommendation for 37wk delivery, which was set up for 1/19. S/p normal fetal echo.  -1/9: 98%, 3440gm, Q000111Q, afi 34, cephalic, bpp 8/8  4. Supervision of high risk pregnancy, antepartum GBS today  5. Polyhydramnios in third trimester complication, single or unspecified fetus See above. Recommend u/s for presentation on admission and with ROM  6. Excessive fetal growth affecting management of pregnancy in third trimester, single or unspecified fetus Pt states this baby feels bigger and prior child was 7lbs 5oz. Shoulder dystocia precautions in place and watch labor curve carefully.  Preterm labor symptoms and general obstetric precautions including but not limited to vaginal bleeding, contractions, leaking of fluid and fetal movement were reviewed in detail with the patient. Please refer to After Visit Summary for other counseling recommendations.   Return in 1 week (on 02/22/2021) for md visit, in person, high risk ob.  Future Appointments  Date Time Provider [place]  02/21/2021 10:45 AM [facility] NURSE [facility]  02/21/2021 11:00 AM [facility] US1 [facility]  02/21/2021 11:30 AM [facility] NST [facility]  02/21/2021  1:15 PM Clarnce Flock, MD [facility]  02/23/2021  6:30 AM MC-LD SCHED ROOM [facility]-INDC None    Aletha Halim, MD

## 2021-02-16 ENCOUNTER — Telehealth (HOSPITAL_COMMUNITY): Payer: Self-pay | Admitting: *Deleted

## 2021-02-16 LAB — GC/CHLAMYDIA PROBE AMP (~~LOC~~) NOT AT ARMC
Chlamydia: NEGATIVE
Comment: NEGATIVE
Comment: NORMAL
Neisseria Gonorrhea: NEGATIVE

## 2021-02-16 NOTE — Telephone Encounter (Signed)
Preadmission screen  

## 2021-02-17 ENCOUNTER — Telehealth (HOSPITAL_COMMUNITY): Payer: Self-pay | Admitting: *Deleted

## 2021-02-17 ENCOUNTER — Other Ambulatory Visit: Payer: Self-pay | Admitting: Advanced Practice Midwife

## 2021-02-17 NOTE — Telephone Encounter (Signed)
Preadmission screen  

## 2021-02-18 LAB — STREP GP B CULTURE+RFLX: Strep Gp B Culture+Rflx: NEGATIVE

## 2021-02-20 ENCOUNTER — Telehealth (HOSPITAL_COMMUNITY): Payer: Self-pay | Admitting: *Deleted

## 2021-02-20 NOTE — Progress Notes (Deleted)
° °  PRENATAL VISIT NOTE  Subjective:  Joanne Waller is a 37 y.o. G3P1011 at [redacted]w[redacted]d being seen today for ongoing prenatal care.  She is currently monitored for the following issues for this high-risk pregnancy and has Supervision of high risk pregnancy, antepartum; Pre-existing type 2 diabetes affecting pregnancy, antepartum; Pre-existing essential hypertension during pregnancy, antepartum; Alpha thalassemia silent carrier; Polyhydramnios in third trimester; and LGA (large for gestational age) fetus affecting management of mother on their problem list.  Patient reports {sx:14538}.   .  .   . Denies leaking of fluid.   The following portions of the patient's history were reviewed and updated as appropriate: allergies, current medications, past family history, past medical history, past social history, past surgical history and problem list.   Objective:  There were no vitals filed for this visit.  Fetal Status:           General:  Alert, oriented and cooperative. Patient is in no acute distress.  Skin: Skin is warm and dry. No rash noted.   Cardiovascular: Normal heart rate noted  Respiratory: Normal respiratory effort, no problems with respiration noted  Abdomen: Soft, gravid, appropriate for gestational age.        Pelvic: {Blank single:19197::"Cervical exam performed in the presence of a chaperone","Cervical exam deferred"}        Extremities: Normal range of motion.     Mental Status: Normal mood and affect. Normal behavior. Normal judgment and thought content.   Assessment and Plan:  Pregnancy: G3P1011 at [redacted]w[redacted]d 1. Pre-existing essential hypertension during pregnancy, antepartum Continue labetalol 100 mg bid with ldASA  2. Pre-existing type 2 diabetes affecting pregnancy, antepartum Pt on semglee 20 qhs and aspart 20 with food and metformin 500 w/ breakfast and lunch. Sugars normal. Pt had 1/9 u/s that showed poly and LGA and recommendation for 37wk delivery, which was set up for 1/19.  S/p normal fetal echo -1/9: 98%, 3440gm, Q000111Q, afi 34, cephalic, bpp 8/8  3. Supervision of high risk pregnancy, antepartum GBS neg  4. Polyhydramnios in third trimester complication, single or unspecified fetus See above  5. Excessive fetal growth affecting management of pregnancy in third trimester, single or unspecified fetus See above  Term labor symptoms and general obstetric precautions including but not limited to vaginal bleeding, contractions, leaking of fluid and fetal movement were reviewed in detail with the patient. Please refer to After Visit Summary for other counseling recommendations.   No follow-ups on file.  Future Appointments  Date Time Provider [place]  02/21/2021 11:00 AM [facility] NURSE [facility]  02/21/2021 11:15 AM [facility] US2 [facility]  02/21/2021 11:30 AM [facility] NST [facility]  02/21/2021  1:15 PM Radene Gunning, MD [facility]  02/23/2021  6:30 AM MC-LD SCHED ROOM [facility]-INDC None    Radene Gunning, MD

## 2021-02-20 NOTE — Telephone Encounter (Signed)
Preadmission screen  

## 2021-02-21 ENCOUNTER — Encounter: Payer: BC Managed Care – PPO | Admitting: Obstetrics and Gynecology

## 2021-02-21 ENCOUNTER — Ambulatory Visit: Payer: BC Managed Care – PPO | Admitting: *Deleted

## 2021-02-21 ENCOUNTER — Ambulatory Visit (HOSPITAL_BASED_OUTPATIENT_CLINIC_OR_DEPARTMENT_OTHER): Payer: BC Managed Care – PPO

## 2021-02-21 ENCOUNTER — Telehealth (HOSPITAL_COMMUNITY): Payer: Self-pay | Admitting: *Deleted

## 2021-02-21 ENCOUNTER — Other Ambulatory Visit: Payer: Self-pay

## 2021-02-21 ENCOUNTER — Encounter: Payer: Self-pay | Admitting: *Deleted

## 2021-02-21 ENCOUNTER — Other Ambulatory Visit (HOSPITAL_COMMUNITY): Payer: Self-pay | Admitting: *Deleted

## 2021-02-21 ENCOUNTER — Encounter (HOSPITAL_COMMUNITY): Payer: Self-pay | Admitting: *Deleted

## 2021-02-21 VITALS — BP 142/80 | HR 74

## 2021-02-21 DIAGNOSIS — O24119 Pre-existing diabetes mellitus, type 2, in pregnancy, unspecified trimester: Secondary | ICD-10-CM

## 2021-02-21 DIAGNOSIS — O403XX0 Polyhydramnios, third trimester, not applicable or unspecified: Secondary | ICD-10-CM

## 2021-02-21 DIAGNOSIS — Z794 Long term (current) use of insulin: Secondary | ICD-10-CM | POA: Diagnosis not present

## 2021-02-21 DIAGNOSIS — Z7982 Long term (current) use of aspirin: Secondary | ICD-10-CM | POA: Diagnosis not present

## 2021-02-21 DIAGNOSIS — O0990 Supervision of high risk pregnancy, unspecified, unspecified trimester: Secondary | ICD-10-CM

## 2021-02-21 DIAGNOSIS — O1414 Severe pre-eclampsia complicating childbirth: Secondary | ICD-10-CM | POA: Diagnosis not present

## 2021-02-21 DIAGNOSIS — O24113 Pre-existing diabetes mellitus, type 2, in pregnancy, third trimester: Secondary | ICD-10-CM | POA: Insufficient documentation

## 2021-02-21 DIAGNOSIS — O10019 Pre-existing essential hypertension complicating pregnancy, unspecified trimester: Secondary | ICD-10-CM

## 2021-02-21 DIAGNOSIS — O10013 Pre-existing essential hypertension complicating pregnancy, third trimester: Secondary | ICD-10-CM | POA: Diagnosis not present

## 2021-02-21 DIAGNOSIS — Z87891 Personal history of nicotine dependence: Secondary | ICD-10-CM | POA: Diagnosis not present

## 2021-02-21 DIAGNOSIS — O10919 Unspecified pre-existing hypertension complicating pregnancy, unspecified trimester: Secondary | ICD-10-CM | POA: Insufficient documentation

## 2021-02-21 DIAGNOSIS — Z3A36 36 weeks gestation of pregnancy: Secondary | ICD-10-CM

## 2021-02-21 DIAGNOSIS — O114 Pre-existing hypertension with pre-eclampsia, complicating childbirth: Secondary | ICD-10-CM | POA: Diagnosis not present

## 2021-02-21 DIAGNOSIS — Z0542 Observation and evaluation of newborn for suspected metabolic condition ruled out: Secondary | ICD-10-CM | POA: Diagnosis not present

## 2021-02-21 DIAGNOSIS — O164 Unspecified maternal hypertension, complicating childbirth: Secondary | ICD-10-CM | POA: Diagnosis not present

## 2021-02-21 DIAGNOSIS — O3663X0 Maternal care for excessive fetal growth, third trimester, not applicable or unspecified: Secondary | ICD-10-CM | POA: Diagnosis not present

## 2021-02-21 DIAGNOSIS — Z3A37 37 weeks gestation of pregnancy: Secondary | ICD-10-CM | POA: Diagnosis not present

## 2021-02-21 DIAGNOSIS — E119 Type 2 diabetes mellitus without complications: Secondary | ICD-10-CM | POA: Diagnosis not present

## 2021-02-21 DIAGNOSIS — D563 Thalassemia minor: Secondary | ICD-10-CM | POA: Diagnosis not present

## 2021-02-21 DIAGNOSIS — Z880 Allergy status to penicillin: Secondary | ICD-10-CM | POA: Diagnosis not present

## 2021-02-21 DIAGNOSIS — O2412 Pre-existing diabetes mellitus, type 2, in childbirth: Secondary | ICD-10-CM | POA: Diagnosis not present

## 2021-02-21 DIAGNOSIS — Z7984 Long term (current) use of oral hypoglycemic drugs: Secondary | ICD-10-CM | POA: Diagnosis not present

## 2021-02-21 DIAGNOSIS — O24429 Gestational diabetes mellitus in childbirth, unspecified control: Secondary | ICD-10-CM | POA: Diagnosis not present

## 2021-02-21 DIAGNOSIS — Z23 Encounter for immunization: Secondary | ICD-10-CM | POA: Diagnosis not present

## 2021-02-21 NOTE — Procedures (Signed)
Joanne Waller 01-27-1986 [redacted]w[redacted]d  Fetus A Non-Stress Test Interpretation for 02/21/21  Indication: Gestational Diabetes medication controlled  Fetal Heart Rate A Mode: External Baseline Rate (A): 145 bpm Variability: Moderate Accelerations: 15 x 15 Decelerations: None Multiple birth?: No  Uterine Activity Mode: Palpation, Toco Contraction Frequency (min): Irreg Contraction Duration (sec): 20-40 Contraction Quality: Mild Resting Tone Palpated: Relaxed Resting Time: Adequate  Interpretation (Fetal Testing) Nonstress Test Interpretation: Reactive Comments: Dr. Judeth Cornfield reviewed tracing.

## 2021-02-21 NOTE — Telephone Encounter (Signed)
Preadmission screen  

## 2021-02-22 ENCOUNTER — Other Ambulatory Visit: Payer: Self-pay | Admitting: Medical

## 2021-02-22 LAB — SARS CORONAVIRUS 2 (TAT 6-24 HRS): SARS Coronavirus 2: POSITIVE — AB

## 2021-02-23 ENCOUNTER — Other Ambulatory Visit: Payer: Self-pay

## 2021-02-23 ENCOUNTER — Inpatient Hospital Stay (HOSPITAL_COMMUNITY): Payer: BC Managed Care – PPO | Admitting: Anesthesiology

## 2021-02-23 ENCOUNTER — Inpatient Hospital Stay (HOSPITAL_COMMUNITY)
Admission: AD | Admit: 2021-02-23 | Discharge: 2021-02-25 | DRG: 807 | Disposition: A | Discharge status: Home / Self Care | Payer: BC Managed Care – PPO | Attending: Obstetrics & Gynecology | Admitting: Obstetrics & Gynecology

## 2021-02-23 ENCOUNTER — Encounter (HOSPITAL_COMMUNITY): Payer: Self-pay | Admitting: Obstetrics and Gynecology

## 2021-02-23 ENCOUNTER — Other Ambulatory Visit: Payer: Self-pay | Admitting: Advanced Practice Midwife

## 2021-02-23 ENCOUNTER — Inpatient Hospital Stay (HOSPITAL_COMMUNITY): Payer: BC Managed Care – PPO

## 2021-02-23 DIAGNOSIS — O3663X0 Maternal care for excessive fetal growth, third trimester, not applicable or unspecified: Secondary | ICD-10-CM | POA: Diagnosis present

## 2021-02-23 DIAGNOSIS — Z87891 Personal history of nicotine dependence: Secondary | ICD-10-CM

## 2021-02-23 DIAGNOSIS — O1414 Severe pre-eclampsia complicating childbirth: Secondary | ICD-10-CM | POA: Diagnosis not present

## 2021-02-23 DIAGNOSIS — Z23 Encounter for immunization: Secondary | ICD-10-CM | POA: Diagnosis not present

## 2021-02-23 DIAGNOSIS — O1002 Pre-existing essential hypertension complicating childbirth: Secondary | ICD-10-CM | POA: Diagnosis present

## 2021-02-23 DIAGNOSIS — Z880 Allergy status to penicillin: Secondary | ICD-10-CM

## 2021-02-23 DIAGNOSIS — O2412 Pre-existing diabetes mellitus, type 2, in childbirth: Secondary | ICD-10-CM | POA: Diagnosis present

## 2021-02-23 DIAGNOSIS — E119 Type 2 diabetes mellitus without complications: Secondary | ICD-10-CM | POA: Diagnosis present

## 2021-02-23 DIAGNOSIS — Z7982 Long term (current) use of aspirin: Secondary | ICD-10-CM | POA: Diagnosis not present

## 2021-02-23 DIAGNOSIS — Z7984 Long term (current) use of oral hypoglycemic drugs: Secondary | ICD-10-CM

## 2021-02-23 DIAGNOSIS — O403XX0 Polyhydramnios, third trimester, not applicable or unspecified: Secondary | ICD-10-CM | POA: Diagnosis present

## 2021-02-23 DIAGNOSIS — O24919 Unspecified diabetes mellitus in pregnancy, unspecified trimester: Secondary | ICD-10-CM | POA: Diagnosis present

## 2021-02-23 DIAGNOSIS — O114 Pre-existing hypertension with pre-eclampsia, complicating childbirth: Secondary | ICD-10-CM | POA: Diagnosis present

## 2021-02-23 DIAGNOSIS — Z3A37 37 weeks gestation of pregnancy: Secondary | ICD-10-CM | POA: Diagnosis not present

## 2021-02-23 DIAGNOSIS — Z794 Long term (current) use of insulin: Secondary | ICD-10-CM

## 2021-02-23 DIAGNOSIS — D563 Thalassemia minor: Secondary | ICD-10-CM | POA: Diagnosis present

## 2021-02-23 DIAGNOSIS — O1413 Severe pre-eclampsia, third trimester: Secondary | ICD-10-CM | POA: Diagnosis not present

## 2021-02-23 LAB — CBC
HCT: 33.9 % — ABNORMAL LOW (ref 36.0–46.0)
HCT: 35.3 % — ABNORMAL LOW (ref 36.0–46.0)
Hemoglobin: 11.6 g/dL — ABNORMAL LOW (ref 12.0–15.0)
Hemoglobin: 12 g/dL (ref 12.0–15.0)
MCH: 27.4 pg (ref 26.0–34.0)
MCH: 26.9 pg (ref 26.0–34.0)
MCHC: 34.2 g/dL (ref 30.0–36.0)
MCHC: 34 g/dL (ref 30.0–36.0)
MCV: 80 fL (ref 80.0–100.0)
MCV: 79.1 fL — ABNORMAL LOW (ref 80.0–100.0)
Platelets: 199 10*3/uL (ref 150–400)
Platelets: 214 10*3/uL (ref 150–400)
RBC: 4.24 MIL/uL (ref 3.87–5.11)
RBC: 4.46 MIL/uL (ref 3.87–5.11)
RDW: 13.5 % (ref 11.5–15.5)
RDW: 13.5 % (ref 11.5–15.5)
WBC: 5.3 10*3/uL (ref 4.0–10.5)
WBC: 5.1 10*3/uL (ref 4.0–10.5)
nRBC: 0 % (ref 0.0–0.2)
nRBC: 0 % (ref 0.0–0.2)

## 2021-02-23 LAB — CBC WITH DIFFERENTIAL/PLATELET
Abs Immature Granulocytes: 0.04 10*3/uL (ref 0.00–0.07)
Basophils Absolute: 0 10*3/uL (ref 0.0–0.1)
Basophils Relative: 0 %
Eosinophils Absolute: 0 10*3/uL (ref 0.0–0.5)
Eosinophils Relative: 0 %
HCT: 35 % — ABNORMAL LOW (ref 36.0–46.0)
Hemoglobin: 11.5 g/dL — ABNORMAL LOW (ref 12.0–15.0)
Immature Granulocytes: 0 %
Lymphocytes Relative: 8 %
Lymphs Abs: 1 10*3/uL (ref 0.7–4.0)
MCH: 26.7 pg (ref 26.0–34.0)
MCHC: 32.9 g/dL (ref 30.0–36.0)
MCV: 81.2 fL (ref 80.0–100.0)
Monocytes Absolute: 1.2 10*3/uL — ABNORMAL HIGH (ref 0.1–1.0)
Monocytes Relative: 10 %
Neutro Abs: 9.6 10*3/uL — ABNORMAL HIGH (ref 1.7–7.7)
Neutrophils Relative %: 82 %
Platelets: 204 10*3/uL (ref 150–400)
RBC: 4.31 MIL/uL (ref 3.87–5.11)
RDW: 13.7 % (ref 11.5–15.5)
WBC: 11.8 10*3/uL — ABNORMAL HIGH (ref 4.0–10.5)
nRBC: 0 % (ref 0.0–0.2)

## 2021-02-23 LAB — COMPREHENSIVE METABOLIC PANEL
ALT: 13 U/L (ref 0–44)
AST: 21 U/L (ref 15–41)
Albumin: 2.2 g/dL — ABNORMAL LOW (ref 3.5–5.0)
Alkaline Phosphatase: 89 U/L (ref 38–126)
Anion gap: 8 (ref 5–15)
BUN: <5 mg/dL — ABNORMAL LOW (ref 6–20)
CO2: 21 mmol/L — ABNORMAL LOW (ref 22–32)
Calcium: 8.4 mg/dL — ABNORMAL LOW (ref 8.9–10.3)
Chloride: 105 mmol/L (ref 98–111)
Creatinine, Ser: 0.65 mg/dL (ref 0.44–1.00)
GFR, Estimated: >60 mL/min (ref >60)
Glucose, Bld: 96 mg/dL (ref 70–99)
Potassium: 3.6 mmol/L (ref 3.5–5.1)
Sodium: 134 mmol/L — ABNORMAL LOW (ref 135–145)
Total Bilirubin: 0.5 mg/dL (ref 0.3–1.2)
Total Protein: 5.7 g/dL — ABNORMAL LOW (ref 6.5–8.1)

## 2021-02-23 LAB — TYPE AND SCREEN
ABO/RH(D): B POS
Antibody Screen: NEGATIVE

## 2021-02-23 LAB — GLUCOSE, CAPILLARY
Glucose-Capillary: 107 mg/dL — ABNORMAL HIGH (ref 70–99)
Glucose-Capillary: 59 mg/dL — ABNORMAL LOW (ref 70–99)
Glucose-Capillary: 96 mg/dL (ref 70–99)

## 2021-02-23 LAB — RPR: RPR Ser Ql: NONREACTIVE

## 2021-02-23 MED ORDER — FUROSEMIDE 20 MG PO TABS
20.0000 mg | ORAL_TABLET | Freq: Every day | ORAL | Status: DC
  Administered 2021-02-24 – 2021-02-25 (×2): 20 mg via ORAL
  Filled 2021-02-23 (×3): qty 1

## 2021-02-23 MED ORDER — OXYTOCIN BOLUS FROM INFUSION
333.0000 mL | Freq: Once | INTRAVENOUS | Status: AC
  Administered 2021-02-23: 333 mL via INTRAVENOUS

## 2021-02-23 MED ORDER — HYDRALAZINE HCL 20 MG/ML IJ SOLN
10.0000 mg | INTRAMUSCULAR | Status: DC | PRN
  Administered 2021-02-23: 10 mg via INTRAVENOUS
  Filled 2021-02-23: qty 1

## 2021-02-23 MED ORDER — BENZOCAINE-MENTHOL 20-0.5 % EX AERO: 1.0000 "application " | INHALATION_SPRAY | CUTANEOUS | Status: DC | PRN

## 2021-02-23 MED ORDER — PRENATAL MULTIVITAMIN CH
1.0000 | ORAL_TABLET | Freq: Every day | ORAL | Status: DC
  Administered 2021-02-24 – 2021-02-25 (×2): 1 via ORAL
  Filled 2021-02-23 (×2): qty 1

## 2021-02-23 MED ORDER — MISOPROSTOL 50MCG HALF TABLET
50.0000 ug | ORAL_TABLET | ORAL | Status: DC | PRN
  Administered 2021-02-23: 50 ug via BUCCAL
  Filled 2021-02-23: qty 1

## 2021-02-23 MED ORDER — LABETALOL HCL 100 MG PO TABS
100.0000 mg | ORAL_TABLET | Freq: Two times a day (BID) | ORAL | Status: DC
  Administered 2021-02-23: 100 mg via ORAL
  Filled 2021-02-23 (×2): qty 1

## 2021-02-23 MED ORDER — EPHEDRINE 5 MG/ML INJ: 10.0000 mg | INTRAVENOUS | Status: DC | PRN

## 2021-02-23 MED ORDER — FLEET ENEMA 7-19 GM/118ML RE ENEM: 1.0000 | ENEMA | Freq: Every day | RECTAL | Status: DC | PRN

## 2021-02-23 MED ORDER — COCONUT OIL OIL: 1.0000 "application " | TOPICAL_OIL | Status: DC | PRN

## 2021-02-23 MED ORDER — LACTATED RINGERS IV SOLN: INTRAVENOUS | Status: DC

## 2021-02-23 MED ORDER — OXYTOCIN-SODIUM CHLORIDE 30-0.9 UT/500ML-% IV SOLN
1.0000 m[IU]/min | INTRAVENOUS | Status: DC
  Administered 2021-02-23: 2 m[IU]/min via INTRAVENOUS

## 2021-02-23 MED ORDER — LACTATED RINGERS IV SOLN: 500.0000 mL | Freq: Once | INTRAVENOUS | Status: DC

## 2021-02-23 MED ORDER — TERBUTALINE SULFATE 1 MG/ML IJ SOLN: 0.2500 mg | Freq: Once | INTRAMUSCULAR | Status: DC | PRN

## 2021-02-23 MED ORDER — METFORMIN HCL 500 MG PO TABS
500.0000 mg | ORAL_TABLET | Freq: Three times a day (TID) | ORAL | Status: DC
  Administered 2021-02-23: 500 mg via ORAL
  Filled 2021-02-23 (×3): qty 1

## 2021-02-23 MED ORDER — MAGNESIUM SULFATE 40 GM/1000ML IV SOLN
INTRAVENOUS | Status: AC
  Filled 2021-02-23: qty 1000

## 2021-02-23 MED ORDER — SIMETHICONE 80 MG PO CHEW: 80.0000 mg | CHEWABLE_TABLET | ORAL | Status: DC | PRN

## 2021-02-23 MED ORDER — DOCUSATE SODIUM 100 MG PO CAPS
100.0000 mg | ORAL_CAPSULE | Freq: Two times a day (BID) | ORAL | Status: DC
  Administered 2021-02-24 – 2021-02-25 (×3): 100 mg via ORAL
  Filled 2021-02-23 (×3): qty 1

## 2021-02-23 MED ORDER — LIDOCAINE HCL (PF) 1 % IJ SOLN
INTRAMUSCULAR | Status: DC | PRN
  Administered 2021-02-23: 11 mL via EPIDURAL

## 2021-02-23 MED ORDER — ONDANSETRON HCL 4 MG/2ML IJ SOLN
4.0000 mg | Freq: Four times a day (QID) | INTRAMUSCULAR | Status: DC | PRN
  Administered 2021-02-23: 4 mg via INTRAVENOUS
  Filled 2021-02-23: qty 2

## 2021-02-23 MED ORDER — TETANUS-DIPHTH-ACELL PERTUSSIS 5-2.5-18.5 LF-MCG/0.5 IM SUSY
0.5000 mL | PREFILLED_SYRINGE | Freq: Once | INTRAMUSCULAR | Status: AC
  Administered 2021-02-25: 0.5 mL via INTRAMUSCULAR
  Filled 2021-02-23: qty 0.5

## 2021-02-23 MED ORDER — DIBUCAINE (PERIANAL) 1 % EX OINT: 1.0000 "application " | TOPICAL_OINTMENT | CUTANEOUS | Status: DC | PRN

## 2021-02-23 MED ORDER — MAGNESIUM SULFATE 40 GM/1000ML IV SOLN
2.0000 g/h | INTRAVENOUS | Status: AC
  Administered 2021-02-24: 2 g/h via INTRAVENOUS
  Filled 2021-02-23: qty 1000

## 2021-02-23 MED ORDER — FENTANYL CITRATE (PF) 100 MCG/2ML IJ SOLN
100.0000 ug | INTRAMUSCULAR | Status: AC | PRN
  Administered 2021-02-23 (×3): 100 ug via INTRAVENOUS
  Filled 2021-02-23 (×3): qty 2

## 2021-02-23 MED ORDER — LACTATED RINGERS AMNIOINFUSION: INTRAVENOUS | Status: DC

## 2021-02-23 MED ORDER — WITCH HAZEL-GLYCERIN EX PADS: 1.0000 "application " | MEDICATED_PAD | CUTANEOUS | Status: DC | PRN

## 2021-02-23 MED ORDER — BISACODYL 10 MG RE SUPP: 10.0000 mg | Freq: Every day | RECTAL | Status: DC | PRN

## 2021-02-23 MED ORDER — IBUPROFEN 600 MG PO TABS
600.0000 mg | ORAL_TABLET | Freq: Four times a day (QID) | ORAL | Status: DC
  Administered 2021-02-24 – 2021-02-25 (×7): 600 mg via ORAL
  Filled 2021-02-23 (×7): qty 1

## 2021-02-23 MED ORDER — LABETALOL HCL 5 MG/ML IV SOLN
80.0000 mg | INTRAVENOUS | Status: DC | PRN
  Administered 2021-02-23: 80 mg via INTRAVENOUS
  Filled 2021-02-23: qty 16

## 2021-02-23 MED ORDER — DIPHENHYDRAMINE HCL 50 MG/ML IJ SOLN: 12.5000 mg | INTRAMUSCULAR | Status: DC | PRN

## 2021-02-23 MED ORDER — ACETAMINOPHEN 325 MG PO TABS
650.0000 mg | ORAL_TABLET | ORAL | Status: DC | PRN
  Administered 2021-02-23: 650 mg via ORAL
  Filled 2021-02-23: qty 2

## 2021-02-23 MED ORDER — LABETALOL HCL 5 MG/ML IV SOLN
20.0000 mg | INTRAVENOUS | Status: DC | PRN
  Administered 2021-02-23 (×2): 20 mg via INTRAVENOUS
  Filled 2021-02-23 (×2): qty 4

## 2021-02-23 MED ORDER — SOD CITRATE-CITRIC ACID 500-334 MG/5ML PO SOLN: 30.0000 mL | ORAL | Status: DC | PRN

## 2021-02-23 MED ORDER — PHENYLEPHRINE 40 MCG/ML (10ML) SYRINGE FOR IV PUSH (FOR BLOOD PRESSURE SUPPORT): 80.0000 ug | PREFILLED_SYRINGE | INTRAVENOUS | Status: DC | PRN

## 2021-02-23 MED ORDER — DIPHENHYDRAMINE HCL 25 MG PO CAPS: 25.0000 mg | ORAL_CAPSULE | Freq: Four times a day (QID) | ORAL | Status: DC | PRN

## 2021-02-23 MED ORDER — MEASLES, MUMPS & RUBELLA VAC IJ SOLR: 0.5000 mL | Freq: Once | INTRAMUSCULAR | Status: DC

## 2021-02-23 MED ORDER — FENTANYL CITRATE (PF) 100 MCG/2ML IJ SOLN: 50.0000 ug | INTRAMUSCULAR | Status: DC | PRN

## 2021-02-23 MED ORDER — NIFEDIPINE ER OSMOTIC RELEASE 30 MG PO TB24
30.0000 mg | ORAL_TABLET | Freq: Two times a day (BID) | ORAL | Status: DC
  Administered 2021-02-23 – 2021-02-25 (×4): 30 mg via ORAL
  Filled 2021-02-23 (×4): qty 1

## 2021-02-23 MED ORDER — METHYLERGONOVINE MALEATE 0.2 MG PO TABS: 0.2000 mg | ORAL_TABLET | ORAL | Status: DC | PRN

## 2021-02-23 MED ORDER — ONDANSETRON HCL 4 MG/2ML IJ SOLN: 4.0000 mg | INTRAMUSCULAR | Status: DC | PRN

## 2021-02-23 MED ORDER — LACTATED RINGERS IV SOLN: 500.0000 mL | INTRAVENOUS | Status: DC | PRN

## 2021-02-23 MED ORDER — MAGNESIUM SULFATE 40 GM/1000ML IV SOLN
2.0000 g/h | INTRAVENOUS | Status: DC
  Administered 2021-02-23: 2 g/h via INTRAVENOUS

## 2021-02-23 MED ORDER — LABETALOL HCL 5 MG/ML IV SOLN
40.0000 mg | INTRAVENOUS | Status: DC | PRN
  Administered 2021-02-23 (×2): 40 mg via INTRAVENOUS
  Filled 2021-02-23 (×2): qty 8

## 2021-02-23 MED ORDER — FERROUS SULFATE 325 (65 FE) MG PO TABS
325.0000 mg | ORAL_TABLET | ORAL | Status: DC
  Administered 2021-02-24: 325 mg via ORAL
  Filled 2021-02-23: qty 1

## 2021-02-23 MED ORDER — ONDANSETRON HCL 4 MG PO TABS: 4.0000 mg | ORAL_TABLET | ORAL | Status: DC | PRN

## 2021-02-23 MED ORDER — MAGNESIUM SULFATE BOLUS VIA INFUSION
4.0000 g | Freq: Once | INTRAVENOUS | Status: AC
  Administered 2021-02-23: 4 g via INTRAVENOUS

## 2021-02-23 MED ORDER — MEDROXYPROGESTERONE ACETATE 150 MG/ML IM SUSP: 150.0000 mg | INTRAMUSCULAR | Status: DC | PRN

## 2021-02-23 MED ORDER — OXYTOCIN-SODIUM CHLORIDE 30-0.9 UT/500ML-% IV SOLN
2.5000 [IU]/h | INTRAVENOUS | Status: DC
  Filled 2021-02-23 (×2): qty 500

## 2021-02-23 MED ORDER — METHYLERGONOVINE MALEATE 0.2 MG/ML IJ SOLN: 0.2000 mg | INTRAMUSCULAR | Status: DC | PRN

## 2021-02-23 MED ORDER — METFORMIN HCL 500 MG PO TABS
500.0000 mg | ORAL_TABLET | Freq: Two times a day (BID) | ORAL | Status: DC
  Administered 2021-02-24 – 2021-02-25 (×3): 500 mg via ORAL
  Filled 2021-02-23 (×3): qty 1

## 2021-02-23 MED ORDER — FENTANYL-BUPIVACAINE-NACL 0.5-0.125-0.9 MG/250ML-% EP SOLN
12.0000 mL/h | EPIDURAL | Status: DC | PRN
  Administered 2021-02-23: 12 mL/h via EPIDURAL
  Filled 2021-02-23: qty 250

## 2021-02-23 MED ORDER — ACETAMINOPHEN 325 MG PO TABS
650.0000 mg | ORAL_TABLET | ORAL | Status: DC | PRN
  Administered 2021-02-24 (×2): 650 mg via ORAL
  Filled 2021-02-23 (×2): qty 2

## 2021-02-23 MED ORDER — LIDOCAINE HCL (PF) 1 % IJ SOLN
30.0000 mL | INTRAMUSCULAR | Status: AC | PRN
  Administered 2021-02-23: 30 mL via SUBCUTANEOUS
  Filled 2021-02-23: qty 30

## 2021-02-23 NOTE — Anesthesia Preprocedure Evaluation (Signed)
Anesthesia Evaluation  Patient identified by MRN, date of birth, ID band Patient awake    Reviewed: Allergy & Precautions, NPO status , Patient's Chart, lab work & pertinent test results  Airway Mallampati: II  TM Distance: >3 FB Neck ROM: Full    Dental no notable dental hx.    Pulmonary neg pulmonary ROS, former smoker,    Pulmonary exam normal breath sounds clear to auscultation       Cardiovascular hypertension, Pt. on medications negative cardio ROS Normal cardiovascular exam Rhythm:Regular Rate:Normal     Neuro/Psych Depression negative neurological ROS  negative psych ROS   GI/Hepatic negative GI ROS, Neg liver ROS,   Endo/Other  negative endocrine ROSdiabetes, Gestational  Renal/GU negative Renal ROS  negative genitourinary   Musculoskeletal negative musculoskeletal ROS (+)   Abdominal (+) + obese,   Peds negative pediatric ROS (+)  Hematology negative hematology ROS (+)   Anesthesia Other Findings   Reproductive/Obstetrics (+) Pregnancy                             Anesthesia Physical Anesthesia Plan  ASA: 2  Anesthesia Plan: Epidural   Post-op Pain Management:    Induction:   PONV Risk Score and Plan:   Airway Management Planned:   Additional Equipment:   Intra-op Plan:   Post-operative Plan:   Informed Consent:   Plan Discussed with:   Anesthesia Plan Comments:         Anesthesia Quick Evaluation

## 2021-02-23 NOTE — Progress Notes (Addendum)
LABOR PROGRESS NOTE  ARABELA BASALDUA is a 36 y.o. G3P1011 at [redacted]w[redacted]d  admitted for IOL d/t T2DM, HTN, LGA fetus.  Subjective: Pt is doing well, smiling and laughing. She consents to AROM.  Objective: BP (!) 164/96    Pulse 75    Temp 98.4 F (36.9 C) (Oral)    Resp 16    Ht 5\' 10"  (1.778 m)    Wt 135.2 kg    LMP 06/09/2020    BMI 42.76 kg/m  or  Vitals:   02/23/21 1305 02/23/21 1332 02/23/21 1405 02/23/21 1407  BP: (!) 154/80 (!) 157/86 (!) 186/98 (!) 164/96  Pulse: 79 66 78 75  Resp:      Temp:      TempSrc:      Weight:      Height:         Dilation: 5 Station: -3 Presentation: Vertex Exam by:: cresenzo dishmon,cnm FHT: baseline rate 135, moderate varibility, accels present, no decels Toco: ctx q 1-3 minutes  Labs: Lab Results  Component Value Date   WBC 5.1 02/23/2021   HGB 12.0 02/23/2021   HCT 35.3 (L) 02/23/2021   MCV 79.1 (L) 02/23/2021   PLT 214 02/23/2021    Patient Active Problem List   Diagnosis Date Noted   Diabetes in pregnancy 02/23/2021   LGA (large for gestational age) fetus affecting management of mother 02/15/2021   Polyhydramnios in third trimester 01/26/2021   Alpha thalassemia silent carrier 10/14/2020   Pre-existing type 2 diabetes affecting pregnancy, antepartum 08/15/2020   Pre-existing essential hypertension during pregnancy, antepartum 08/15/2020   Supervision of high risk pregnancy, antepartum 08/04/2020    Assessment / Plan: 36 y.o. G3P1011 at [redacted]w[redacted]d here for IOL d/t T2DM, HTN, and LGA fetus. Pt now superimposed  pre-eclampsia with severe features d/t high BPs with systolic pressures >160  Labor: Cooks balloon out, pit started at 1405, AROM at 1400 with clear fluid Pre-eclampsia: magnesium 4 g bolus given and magnesium 2g/hr IV gtt. Labetalol prn, hydralazine prn Fetal Wellbeing:  cat 1 Pain Control:  PRN, plan for epidural Anticipated MOD:  SVD T2DM: CBGs q4h  [redacted]w[redacted]d PGY1, family medicine resident  02/23/2021, 2:13 PM

## 2021-02-23 NOTE — Anesthesia Procedure Notes (Signed)
Epidural Patient location during procedure: OB Start time: 02/23/2021 4:32 PM End time: 02/23/2021 4:49 PM  Staffing Anesthesiologist: Lowella Curb, MD Performed: anesthesiologist   Preanesthetic Checklist Completed: patient identified, IV checked, site marked, risks and benefits discussed, surgical consent, monitors and equipment checked, pre-op evaluation and timeout performed  Epidural Patient position: sitting Prep: ChloraPrep Patient monitoring: heart rate, cardiac monitor, continuous pulse ox and blood pressure Approach: midline Injection technique: LOR air  Needle:  Needle type: Tuohy  Needle gauge: 17 G Needle length: 9 cm Needle insertion depth: 7 cm Catheter type: closed end flexible Catheter size: 20 Guage Catheter at skin depth: 12 cm Test dose: negative  Assessment Events: blood not aspirated, injection not painful, no injection resistance, no paresthesia and negative IV test  Additional Notes Reason for block:procedure for pain

## 2021-02-23 NOTE — Lactation Note (Signed)
This note was copied from a baby's chart. Lactation Consultation Note  Patient Name: Joanne Waller Today's Date: 02/23/2021   Age:34 hours Mom declined [facility] services initially in L&D, but would like to be seen later in OB,  per RN ( Rachael) in L&D.  Mom decided she want to breast and bottle feed infant.  Maternal Data    Feeding Nipple Type: Slow - flow  LATCH Score                    Lactation Tools Discussed/Used    Interventions    Discharge    Consult Status      Danelle Earthly 02/23/2021, 10:23 PM

## 2021-02-23 NOTE — Progress Notes (Signed)
Patient Vitals for the past 4 hrs:  BP Pulse  02/23/21 1650 (!) 130/98 90  02/23/21 1645 (!) 159/99 91  02/23/21 1622 (!) 161/92 84  02/23/21 1612 (!) 150/95 89  02/23/21 1533 (!) 153/96 91  02/23/21 1532 (!) 162/96 88  02/23/21 1502 (!) 144/90 89  02/23/21 1456 (!) 159/94 87  02/23/21 1449 (!) 162/91 87  02/23/21 1435 (!) 167/98 88  02/23/21 1416 (!) 152/90 87  02/23/21 1407 (!) 164/96 75  02/23/21 1405 (!) 186/98 78  02/23/21 1332 (!) 157/86 66   Comfortable w/epidural.  Has received IV labetalol, Mg infusing at 2gm/hr.  FHR Cat 1.Cx 7/80-3.  IUPC placed, MVUs inadequate.  Pitocin at 8 mu/min, will increase until labor adequate.

## 2021-02-23 NOTE — OR Nursing (Signed)
[place] ice and ginger ale given

## 2021-02-23 NOTE — H&P (Addendum)
OBSTETRIC ADMISSION HISTORY AND PHYSICAL  Joanne Waller is a 36 y.o. female G33P1011 with IUP at 48w1dby clinical EDD presenting for IOL d/t T2DM, HTN, Has a suspected LGA fetus. She reports +FMs, No LOF, no VB, no blurry vision, headaches or peripheral edema, and RUQ pain.  She plans on breast and bottle feeding. She plans on condoms for birth control. [facility]  Dating: By clinical EDD --->  Estimated Date of Delivery: 03/15/21  Sono:    '@[redacted]w[redacted]d' , CWD, normal anatomy, cephalic presentation, anterior placenta, 3440g, 98% EFW   Prenatal History/Complications:  TY5KP(Metformin and Insulin) Alpha thalassemia carrier  Polyhydramnios  LGA Chronic hypertension (Labetalol)   Past Medical History: Past Medical History:  Diagnosis Date   Closed avulsion fracture of left ankle 08/14/2019   Depression    [place]    Hypertension    Pyelonephritis    Renal disorder    pyelonephritis   Seizures ([place]    none since age 36   Past Surgical History: Past Surgical History:  Procedure Laterality Date   CHOLECYSTECTOMY     DILATION AND CURETTAGE OF UTERUS      Obstetrical History: OB History     Gravida  3   Para  1   Term  1   Preterm      AB  1   Living  1      SAB  1   IAB      Ectopic      Multiple      Live Births  1           Social History Social History   Socioeconomic History   Marital status: Significant Other    Spouse name: Not on file   Number of children: Not on file   Years of education: Not on file   Highest education level: Not on file  Occupational History   Not on file  Tobacco Use   Smoking status: Former    Packs/day: 0.50    Years: 7.00    Pack years: 3.50    Types: Cigarettes    Quit date: 06/23/2020    Years since quitting: 0.6   Smokeless tobacco: Never  Vaping Use   Vaping Use: Never used  Substance and Sexual Activity   Alcohol use: Not Currently     Comment: not while preg   Drug use: No   Sexual activity: Yes    Partners: Male    Birth control/protection: None  Other Topics Concern   Not on file  Social History Narrative   Not on file   Social Determinants of Health   Financial Resource Strain: Not on file  Food Insecurity: No Food Insecurity   Worried About Running Out of Food in the Last Year: Never true   Ran Out of Food in the Last Year: Never true  Transportation Needs: No Transportation Needs   Lack of Transportation (Medical): No   Lack of Transportation (Non-Medical): No  Physical Activity: Not on file  Stress: Not on file  Social Connections: Not on file    Family History: Family History  Problem Relation Age of Onset   Kidney disease Mother    Hypertension Mother    Sickle cell anemia Father    Hyperlipidemia Maternal Grandmother     Allergies: Allergies  Allergen Reactions   Amoxicillin Other (See Comments)    Pt stated, "I get a severe blinding  headache"   Penicillins Other (See Comments)    Sharp pain in temple    Latex Itching, Swelling and Rash    Medications Prior to Admission  Medication Sig Dispense Refill Last Dose   ACCU-CHEK GUIDE test strip       Accu-Chek Softclix Lancets lancets daily.      acetaminophen (TYLENOL) 325 MG tablet Take 2 tablets (650 mg total) by mouth every 6 (six) hours as needed. 30 tablet 0    aspirin EC 81 MG tablet Take 1 tablet (81 mg total) by mouth daily. Swallow whole. 30 tablet 11    B-D ULTRAFINE III SHORT PEN 31G X 8 MM MISC daily.      Blood Glucose Monitoring Suppl (ACCU-CHEK GUIDE) w/Device KIT       Blood Pressure Monitoring (BLOOD PRESSURE KIT) DEVI 1 each by Does not apply route once a week. 1 each 0    Continuous Blood Gluc Transmit (DEXCOM G6 TRANSMITTER) MISC 1 Device by Does not apply route every 3 (three) months. 1 each 3    insulin aspart (NOVOLOG) 100 UNIT/ML injection Inject 26 Units into the skin daily. 10 mL 12    insulin glargine-yfgn  (SEMGLEE) 100 UNIT/ML Pen Inject 52 Units into the skin at bedtime. 15 mL 5    labetalol (NORMODYNE) 100 MG tablet Take 100 mg by mouth 2 (two) times daily.      metFORMIN (GLUCOPHAGE) 500 MG tablet Take by mouth 2 (two) times daily with a meal.      omeprazole (PRILOSEC OTC) 20 MG tablet Take 1 tablet (20 mg total) by mouth daily. 30 tablet 3    promethazine (PHENERGAN) 25 MG tablet Take 1 tablet (25 mg total) by mouth every 6 (six) hours as needed for nausea or vomiting. 30 tablet 0      Review of Systems   All systems reviewed and negative except as stated in HPI  Blood pressure (!) 158/99, pulse 85, temperature 98.4 F (36.9 C), temperature source Oral, resp. rate 16, height '5\' 10"'  (1.778 m), weight 135.2 kg, last menstrual period 06/09/2020. General appearance: alert, no distress, and morbidly obese Lungs: clear to auscultation bilaterally Heart: regular rate and rhythm Abdomen: soft, non-tender; bowel sounds normal Pelvic: normal female external genitalia with no lesions, warts, ulcerations Extremities: no significant edema Presentation: cephalic Fetal monitoringBaseline: 140 bpm, Variability: Good {> 6 bpm), Accelerations: Reactive, and Decelerations: Absent Uterine activity irregular ctx Dilation: 2.5 Exam by:: cresenzo dishmon,cnm   Prenatal labs: ABO, Rh: --/--/PENDING (01/19 0900) Antibody: PENDING (01/19 0900) Rubella: 2.87 (07/11 1627) RPR: Non Reactive (11/22 1134)  HBsAg: Negative (07/11 1627)  HIV: Non Reactive (11/22 1134)  GBS: Negative/-- (01/11 1634)  2 hr Glucola abnormal Genetic screening  NIPS LR, AFP neg, Horizon alpha thal silent carrier o/w neg Anatomy US normal  Prenatal Transfer Tool  Maternal Diabetes: Yes:  Diabetes Type:  Pre-pregnancy Genetic Screening: Abnormal:  Results: Other:alpha thal silent carrier  Maternal Ultrasounds/Referrals: Normal Fetal Ultrasounds or other Referrals:  Referred to Materal Fetal Medicine  Maternal Substance  Abuse:  No Significant Maternal Medications:  None Significant Maternal Lab Results: None  Results for orders placed or performed during the hospital encounter of 02/23/21 (from the past 24 hour(s))  Type and screen   Collection Time: 02/23/21  9:00 AM  Result Value Ref Range   ABO/RH(D) PENDING    Antibody Screen PENDING    Sample Expiration      02/26/2021,2359 Performed at [facility], [address] [place], [place] [address]   Glucose, capillary   Collection Time: 02/23/21  9:19 AM  Result Value Ref Range   Glucose-Capillary 107 (H) 70 - 99 mg/dL   Comment 1 Document in Chart   CBC   Collection Time: 02/23/21  9:20 AM  Result Value Ref Range   WBC 5.3 4.0 - 10.5 K/uL   RBC 4.24 3.87 - 5.11 MIL/uL   Hemoglobin 11.6 (L) 12.0 - 15.0 g/dL   HCT 33.9 (L) 36.0 - 46.0 %   MCV 80.0 80.0 - 100.0 fL   MCH 27.4 26.0 - 34.0 pg   MCHC 34.2 30.0 - 36.0 g/dL   RDW 13.5 11.5 - 15.5 %   Platelets 199 150 - 400 K/uL   nRBC 0.0 0.0 - 0.2 %  Comprehensive metabolic panel   Collection Time: 02/23/21  9:20 AM  Result Value Ref Range   Sodium 134 (L) 135 - 145 mmol/L   Potassium 3.6 3.5 - 5.1 mmol/L   Chloride 105 98 - 111 mmol/L   CO2 21 (L) 22 - 32 mmol/L   Glucose, Bld 96 70 - 99 mg/dL   BUN <5 (L) 6 - 20 mg/dL   Creatinine, Ser 0.65 0.44 - 1.00 mg/dL   Calcium 8.4 (L) 8.9 - 10.3 mg/dL   Total Protein 5.7 (L) 6.5 - 8.1 g/dL   Albumin 2.2 (L) 3.5 - 5.0 g/dL   AST 21 15 - 41 U/L   ALT 13 0 - 44 U/L   Alkaline Phosphatase 89 38 - 126 U/L   Total Bilirubin 0.5 0.3 - 1.2 mg/dL   GFR, Estimated >60 >60 mL/min   Anion gap 8 5 - 15    Patient Active Problem List   Diagnosis Date Noted   Diabetes in pregnancy 02/23/2021   LGA (large for gestational age) fetus affecting management of mother 02/15/2021   Polyhydramnios in third trimester 01/26/2021   Alpha thalassemia silent carrier 10/14/2020   Pre-existing type 2 diabetes affecting pregnancy, antepartum 08/15/2020    Pre-existing essential hypertension during pregnancy, antepartum 08/15/2020   Supervision of high risk pregnancy, antepartum 08/04/2020    Assessment/Plan:  EMIRA EUBANKS is a 36 y.o. G3P1011 at 80w1dhere for IOL d/t T2DM and HTN. Pt had CBG 107 this a.m. and BP of 158/99.  #Labor:Pt is dilated to 2.5 with irregular ctx. Pt has had Cytotex x1 and Cooks balloon has been placed.  #Pain: PRN, plan for epidural #FWB: Cat 1 #ID:  GBS neg #MOF: breast and bottle #MOC:condoms #Circ:  Yes #HTN: Labetalol 100 mg BID #T2DM: Metformin 500 mg TID with meals, CBGs q4h  SPrecious Gilding DO  02/23/2021, 10:22 AM  I personally saw and evaluated the patient, performing the key elements of the service. I developed and verified the management plan that is described in the resident's/student's note, and I agree with the content with my edits above. VSS, HRR&R, Resp unlabored, Legs neg.  FNigel Berthold CNM 02/23/2021 2:18 PM

## 2021-02-23 NOTE — Discharge Summary (Signed)
Postpartum Discharge Summary  Date of Service updated 1/21     Patient Name: Joanne Waller DOB: July 23, 1985 MRN: [id number]  Date of admission: 02/23/2021 Delivery date:02/23/2021  Delivering provider: Christin Fudge  Date of discharge: 02/25/2021  Admitting diagnosis: Diabetes in pregnancy [O24.919] Intrauterine pregnancy: [redacted]w[redacted]d    Secondary diagnosis:  Principal Problem:   Diabetes in pregnancy Active Problems:   Preeclampsia, severe, third trimester  Additional problems:  polyhydramnios    Discharge diagnosis: Term Pregnancy Delivered, Preeclampsia (severe), and Type 2 DM                                              Post partum procedures: none Augmentation: AROM, Pitocin, Cytotec, and IP Foley Complications: None  Hospital course: Induction of Labor With Vaginal Delivery   36y.o. yo G3P1011 at 372w1das admitted to the hospital 02/23/2021 for induction of labor.  Indication for induction:  CHTN and Class B DM .  Patient had an uncomplicated labor course as follows: Membrane Rupture Time/Date: 2:00 PM ,02/23/2021   Delivery Method:Vaginal, Spontaneous  Episiotomy: None  Lacerations:  2nd degree;Perineal  Details of delivery can be found in separate delivery note.  She received IV Magnesium x 24hr for seizure prophylaxis.  Her blood pressure was managed with procardia XL 3016mid and Lasix x 5 days.  She met postpartum milestones appropriately and she  is discharged home 02/25/21.  Newborn Data: Birth date:02/23/2021  Birth time:7:53 PM  Gender:Female  Living status:Living  Apgars:8 ,8  Weight:3530 g   Magnesium Sulfate received: Yes: Seizure prophylaxis BMZ received: No Rhophylac:N/A MMR:N/A T-DaP:Given postpartum Flu: Yes Transfusion:No  Physical exam  Vitals:   02/24/21 1950 02/25/21 0039 02/25/21 0428 02/25/21 0725  BP:  (!) 153/73 136/77 (!) 143/82  Pulse:  91 83 82  Resp:  _0 Temp:  98.7 F (37.1 C) 98.6 F (37 C) 97.7 F (36.5 C)   TempSrc:  Oral Oral Axillary  SpO2: 98%   98%  Weight:      Height:       General: alert, cooperative, and no distress Lochia: appropriate Uterine Fundus: firm Incision: N/A DVT Evaluation: No evidence of DVT seen on physical exam. Labs: Lab Results  Component Value Date   WBC 11.8 (H) 02/23/2021   HGB 11.5 (L) 02/23/2021   HCT 35.0 (L) 02/23/2021   MCV 81.2 02/23/2021   PLT 204 02/23/2021   CMP Latest Ref Rng & Units 02/23/2021  Glucose 70 - 99 mg/dL 96  BUN 6 - 20 mg/dL <5(L)  Creatinine 0.44 - 1.00 mg/dL 0.65  Sodium 135 - 145 mmol/L 134(L)  Potassium 3.5 - 5.1 mmol/L 3.6  Chloride 98 - 111 mmol/L 105  CO2 22 - 32 mmol/L 21(L)  Calcium 8.9 - 10.3 mg/dL 8.4(L)  Total Protein 6.5 - 8.1 g/dL 5.7(L)  Total Bilirubin 0.3 - 1.2 mg/dL 0.5  Alkaline Phos 38 - 126 U/L 89  AST 15 - 41 U/L 21  ALT 0 - 44 U/L 13   Edinburgh Score: Edinburgh Postnatal Depression Scale Screening Tool 02/25/2021  I have been able to laugh and see the funny side of things. 0  I have looked forward with enjoyment to things. 0  I have blamed myself unnecessarily when things went wrong. 1  I have been anxious or worried for no good  reason. 2  I have felt scared or panicky for no good reason. 2  Things have been getting on top of me. 1  I have been so unhappy that I have had difficulty sleeping. 0  I have felt sad or miserable. 1  I have been so unhappy that I have been crying. 0  The thought of harming myself has occurred to me. 0  Edinburgh Postnatal Depression Scale Total 7     After visit meds:  Allergies as of 02/25/2021       Reactions   Amoxicillin Other (See Comments)   Pt stated, "I get a severe blinding headache"   Penicillins Other (See Comments)   Sharp pain in temple    Latex Itching, Swelling, Rash        Medication List     STOP taking these medications    aspirin EC 81 MG tablet   insulin glargine-yfgn 100 UNIT/ML Pen Commonly known as: SEMGLEE   labetalol 100 MG  tablet Commonly known as: NORMODYNE   NovoLOG FlexPen 100 UNIT/ML FlexPen Generic drug: insulin aspart   omeprazole 20 MG tablet Commonly known as: PriLOSEC OTC   promethazine 25 MG tablet Commonly known as: PHENERGAN       TAKE these medications    Accu-Chek Guide test strip Generic drug: glucose blood   Accu-Chek Guide w/Device Kit   Accu-Chek Softclix Lancets lancets daily.   acetaminophen 325 MG tablet Commonly known as: Tylenol Take 2 tablets (650 mg total) by mouth every 4 (four) hours as needed for moderate pain (for pain scale < 4). What changed:  when to take this reasons to take this   B-D ULTRAFINE III SHORT PEN 31G X 8 MM Misc Generic drug: Insulin Pen Needle daily.   Blood Pressure Kit Devi 1 each by Does not apply route once a week.   Dexcom G6 Transmitter Misc 1 Device by Does not apply route every 3 (three) months.   furosemide 20 MG tablet Commonly known as: LASIX Take 1 tablet (20 mg total) by mouth daily for 3 days.   ibuprofen 600 MG tablet Commonly known as: ADVIL Take 1 tablet (600 mg total) by mouth every 6 (six) hours as needed.   insulin detemir 100 UNIT/ML injection Commonly known as: LEVEMIR Inject 0.14 mLs (14 Units total) into the skin at bedtime.   metFORMIN 500 MG tablet Commonly known as: GLUCOPHAGE Take by mouth 2 (two) times daily with a meal.   NIFEdipine 30 MG 24 hr tablet Commonly known as: ADALAT CC Take 1 tablet (30 mg total) by mouth 2 (two) times daily.   prenatal multivitamin Tabs tablet Take 1 tablet by mouth daily at 12 noon.         Discharge home in stable condition Infant Feeding: Bottle and Breast Infant Disposition:home with mother Discharge instruction: per After Visit Summary and Postpartum booklet. Activity: Advance as tolerated. Pelvic rest for 6 weeks.  Diet: carb modified diet Future Appointments:No future appointments. Follow up Visit:  [facility]. Schedule an appointment as soon as possible for a visit in 1 week(s).   Specialty: Obstetrics and Gynecology Why: Please follow up in 1 wk for a blood pressure check Contact information: [place] [phone]                 Please schedule this patient for a Virtual postpartum visit in  4 weeks with the following provider: Any provider. Additional Postpartum F/U:BP check 1 week  High risk pregnancy complicated by:  CHTN, Class B DM and polyhydramnios Delivery mode:  Vaginal, Spontaneous  Anticipated Birth Control:  Unsure   02/25/2021 Annalee Genta, DO

## 2021-02-24 LAB — GLUCOSE, CAPILLARY
Glucose-Capillary: 122 mg/dL — ABNORMAL HIGH (ref 70–99)
Glucose-Capillary: 136 mg/dL — ABNORMAL HIGH (ref 70–99)
Glucose-Capillary: 139 mg/dL — ABNORMAL HIGH (ref 70–99)
Glucose-Capillary: 141 mg/dL — ABNORMAL HIGH (ref 70–99)
Glucose-Capillary: 192 mg/dL — ABNORMAL HIGH (ref 70–99)

## 2021-02-24 MED ORDER — INSULIN DETEMIR 100 UNIT/ML ~~LOC~~ SOLN
14.0000 [IU] | Freq: Every day | SUBCUTANEOUS | Status: DC
  Administered 2021-02-24: 14 [IU] via SUBCUTANEOUS
  Filled 2021-02-24 (×2): qty 0.14

## 2021-02-24 MED ORDER — INSULIN ASPART 100 UNIT/ML IJ SOLN
0.0000 [IU] | Freq: Three times a day (TID) | INTRAMUSCULAR | Status: DC
  Administered 2021-02-24 (×2): 3 [IU] via SUBCUTANEOUS

## 2021-02-24 NOTE — Lactation Note (Addendum)
This note was copied from a baby's chart. Lactation Consultation Note  Patient Name: Joanne Waller [id number] Date: 02/24/2021 Reason for consult: Initial assessment;Early term 37-38.6wks;1st time breastfeeding;Maternal endocrine disorder Age:34 hours  P2, [redacted]w[redacted]d Mother did not breastfeed her first child. She wants to attempt with this child and formula feed. She has pumped x 1 with DEBP.  Baby recently had 20 ml bottle formula and is getting ready to be bathed. Reviewed hand expression with mother. Feed on demand with cues.  Goal 8-12+ times per day after first 24 hrs.  Place baby STS if not cueing.  Provided education that baby feeds with cues and not q 3 hours.  Mom made aware of O/P services, breastfeeding support groups, community resources, and our phone # for post-discharge questions.  After bath offered to assist with latching and mother declined.   Maternal Data Has patient been taught Hand Expression?: Yes Does the patient have breastfeeding experience prior to this delivery?: No  Feeding Mother's Current Feeding Choice: Breast Milk and Formula Nipple Type: Slow - flow   Lactation Tools Discussed/Used Tools: Pump Breast pump type: Double-Electric Breast Pump Pump Education: Setup, frequency, and cleaning;Milk Storage Reason for Pumping: stimulation and supplementation Pumping frequency:  (Recommend q 3 hours)  Interventions Interventions: Breast feeding basics reviewed;Hand express;DEBP;Education;LC Services brochure  Discharge Pump:  (no pump at home)  Consult Status Consult Status: Follow-up Date: 02/25/21 Follow-up type: In-patient    Vivianne Master [facility] 02/24/2021, 10:15 AM

## 2021-02-24 NOTE — Anesthesia Postprocedure Evaluation (Signed)
Anesthesia Post Note  Patient: CHANTEE CERINO  Procedure(s) Performed: AN AD HOC LABOR EPIDURAL     Patient location during evaluation: Mother Baby Anesthesia Type: Epidural Level of consciousness: awake and alert Pain management: pain level controlled Vital Signs Assessment: post-procedure vital signs reviewed and stable Respiratory status: spontaneous breathing, nonlabored ventilation and respiratory function stable Cardiovascular status: stable Postop Assessment: no headache, no backache, epidural receding, no apparent nausea or vomiting, patient able to bend at knees, adequate PO intake and able to ambulate Anesthetic complications: no   No notable events documented.  Last Vitals:  Vitals:   02/24/21 0740 02/24/21 0800  BP: 139/80   Pulse: 84   Resp: 18 18  Temp: 36.4 C   SpO2: 94%     Last Pain:  Vitals:   02/24/21 0750  TempSrc:   PainSc: 4    Pain Goal:                   Laban Emperor

## 2021-02-24 NOTE — Progress Notes (Signed)
Inpatient Diabetes Program Recommendations  AACE/ADA: New Consensus Statement on Inpatient Glycemic Control (2015)  Target Ranges:  Prepandial:   less than 140 mg/dL      Peak postprandial:   less than 180 mg/dL (1-2 hours)      Critically ill patients:  140 - 180 mg/dL   Lab Results  Component Value Date   GLUCAP 136 (H) 02/24/2021   HGBA1C 11.3 (H) 08/15/2020    Review of Glycemic Control  Latest Reference Range & Units 02/24/21 00:03 02/24/21 04:11 02/24/21 10:03  Glucose-Capillary 70 - 99 mg/dL 979 (H) 892 (H) 119 (H)  (H): Data is abnormally high Diabetes history: Type 2 DM Outpatient Diabetes medications: Semglee 52 units QHS, Novolog 20 units TID, Metformin 500 mg BID Current orders for Inpatient glycemic control: Novolog 0-20 units TID, Metformin 500 mg BID  Inpatient Diabetes Program Recommendations:    Consider adding Semglee 18 units QD (135 kg x 0.15) and plan to have patient follow up with PCP.  Of note, patient needs refill at discharge on Dexcom supplies.   Spoke with patient regarding outpatient diabetes management. Patient was only on Metformin prior to pregnancy, however A1C in July 2022 was 11.3%.  Reviewed patient's previous A1c of 11.3% and implications regarding postpartum accuracy and when to have lab redrawn. Explained what a A1c is and what it measures. Also reviewed goal A1c with patient, importance of good glucose control @ home, and blood sugar goals. Reviewed patho of DM, current inpatient glucose trends, insulin resistance, insulin needs and trends now that delivered, what to anticipate in the postpartum period from glycemic perspective, impact of lactation, risk of hypoglycemia, interventions, when to call MD, frequency for checking CBGs per Dexcom, vascular changes and comorbidites. Encouraged to continue with carb modified diet as she was doing in pregnancy and stressed importance of following up with PCP.    Thanks, Lujean Rave, MSN,  [facility] Diabetes Coordinator [phone] (8a-5p)

## 2021-02-24 NOTE — Progress Notes (Signed)
POSTPARTUM PROGRESS NOTE  PPD #1  Subjective:  Joanne Waller is a 36 y.o. [id number] s/p NSVD at [redacted]w[redacted]d. Today she notes mild headache this am, feeling a little foggy.  Rates her pain 3/10, improved with tylenol. She denies any problems with ambulating, voiding or po intake. Denies nausea or vomiting. She has passed flatus, no BM.  Pain is well controlled.  Lochia improving Denies fever/chills/chest pain/SOB.  no blurry vision, noRUQ pain  Objective: Blood pressure 139/80, pulse 84, temperature 97.6 F (36.4 C), temperature source Oral, resp. rate 18, height 5\' 10"  (1.778 m), weight 135.2 kg, last menstrual period 06/09/2020, SpO2 94 %, unknown if currently breastfeeding.  Physical Exam:  General: alert, cooperative and no distress Chest: no respiratory distress Heart: regular rate and rhythm Abdomen: soft, nontender Uterine Fundus: firm, appropriately tender DVT Evaluation: No calf swelling or tenderness Extremities: no edema Skin: warm, dry  Results for orders placed or performed during the hospital encounter of 02/23/21 (from the past 24 hour(s))  CBC     Status: Abnormal   Collection Time: 02/23/21  2:04 PM  Result Value Ref Range   WBC 5.1 4.0 - 10.5 K/uL   RBC 4.46 3.87 - 5.11 MIL/uL   Hemoglobin 12.0 12.0 - 15.0 g/dL   HCT 02/25/21 (L) 37.8 - 58.8 %   MCV 79.1 (L) 80.0 - 100.0 fL   MCH 26.9 26.0 - 34.0 pg   MCHC 34.0 30.0 - 36.0 g/dL   RDW 50.2 77.4 - 12.8 %   Platelets 214 150 - 400 K/uL   nRBC 0.0 0.0 - 0.2 %  Glucose, capillary     Status: Abnormal   Collection Time: 02/23/21  5:22 PM  Result Value Ref Range   Glucose-Capillary 59 (L) 70 - 99 mg/dL  Glucose, capillary     Status: None   Collection Time: 02/23/21  6:21 PM  Result Value Ref Range   Glucose-Capillary 96 70 - 99 mg/dL  CBC with Differential/Platelet     Status: Abnormal   Collection Time: 02/23/21  9:41 PM  Result Value Ref Range   WBC 11.8 (H) 4.0 - 10.5 K/uL   RBC 4.31 3.87 - 5.11 MIL/uL   Hemoglobin  11.5 (L) 12.0 - 15.0 g/dL   HCT 02/25/21 (L) 76.7 - 20.9 %   MCV 81.2 80.0 - 100.0 fL   MCH 26.7 26.0 - 34.0 pg   MCHC 32.9 30.0 - 36.0 g/dL   RDW 47.0 96.2 - 83.6 %   Platelets 204 150 - 400 K/uL   nRBC 0.0 0.0 - 0.2 %   Neutrophils Relative % 82 %   Neutro Abs 9.6 (H) 1.7 - 7.7 K/uL   Lymphocytes Relative 8 %   Lymphs Abs 1.0 0.7 - 4.0 K/uL   Monocytes Relative 10 %   Monocytes Absolute 1.2 (H) 0.1 - 1.0 K/uL   Eosinophils Relative 0 %   Eosinophils Absolute 0.0 0.0 - 0.5 K/uL   Basophils Relative 0 %   Basophils Absolute 0.0 0.0 - 0.1 K/uL   Immature Granulocytes 0 %   Abs Immature Granulocytes 0.04 0.00 - 0.07 K/uL  Glucose, capillary     Status: Abnormal   Collection Time: 02/24/21 12:03 AM  Result Value Ref Range   Glucose-Capillary 192 (H) 70 - 99 mg/dL  Glucose, capillary     Status: Abnormal   Collection Time: 02/24/21  4:11 AM  Result Value Ref Range   Glucose-Capillary 139 (H) 70 - 99 mg/dL  Assessment/Plan: Joanne Waller is a 36 y.o. [id number] s/p NSVD at [redacted]w[redacted]d PPD#1 complicated by: 1) cHTN with superimposed preeclampsia -currently on Magnesium until ~2000 -BP stable on Procardia XL 30mg  bid -continue Lasix 20mg  daily for a total of 5 days  2) Class B DM -continued on metformin 500mg  bid -sliding scale ordered for meal coverage -previously on insulin, will review with DM coordinator today  3)Postpartum care -meeting milestones appropriately -Consented for circumcision- risk/benefit and indications reviewed with patient and desires to proceed  Contraception: unsure Feeding: both  Dispo: Continue postpartum care as outlined above   LOS: 1 day   , DO Faculty Attending, [facility] 02/24/2021, 9:30 AM

## 2021-02-24 NOTE — Progress Notes (Signed)
Post Partum Day 1 Subjective: no complaints and tolerating PO  Objective: Blood pressure 140/81, pulse 91, temperature 97.7 F (36.5 C), temperature source Oral, resp. rate 18, height 5\' 10"  (1.778 m), weight 135.2 kg, last menstrual period 06/09/2020, SpO2 98 %, unknown if currently breastfeeding.  Physical Exam:  General: alert, cooperative, and no distress Lochia: appropriate Uterine Fundus: firm DVT Evaluation: No evidence of DVT seen on physical exam.  Recent Labs    02/23/21 1404 02/23/21 2141  HGB 12.0 11.5*  HCT 35.3* 35.0*  CBG (last 3)  Recent Labs    02/23/21 1821 02/24/21 0003 02/24/21 0411  GLUCAP 96 192* 139*     Assessment/Plan: Magnesium until 2000 CBG coverage ordered Procardia 30 XL QD   LOS: 1 day   02/26/21 02/24/2021, 7:30 AM

## 2021-02-25 LAB — GLUCOSE, CAPILLARY
Glucose-Capillary: 71 mg/dL (ref 70–99)
Glucose-Capillary: 104 mg/dL — ABNORMAL HIGH (ref 70–99)

## 2021-02-25 MED ORDER — IBUPROFEN 600 MG PO TABS: 600.0000 mg | ORAL_TABLET | Freq: Four times a day (QID) | ORAL | 0 refills | Status: DC | PRN

## 2021-02-25 MED ORDER — FUROSEMIDE 20 MG PO TABS: 20.0000 mg | ORAL_TABLET | Freq: Every day | ORAL | 0 refills | Status: DC

## 2021-02-25 MED ORDER — INSULIN DETEMIR 100 UNIT/ML ~~LOC~~ SOLN: 14.0000 [IU] | Freq: Every day | SUBCUTANEOUS | 11 refills | Status: DC

## 2021-02-25 MED ORDER — NIFEDIPINE 10 MG PO CAPS
10.0000 mg | ORAL_CAPSULE | ORAL | Status: DC | PRN
  Administered 2021-02-25: 10 mg via ORAL
  Filled 2021-02-25: qty 1

## 2021-02-25 MED ORDER — ACETAMINOPHEN 325 MG PO TABS
650.0000 mg | ORAL_TABLET | ORAL | Status: AC | PRN
Start: 2021-02-25 — End: ?

## 2021-02-25 MED ORDER — LISINOPRIL 10 MG PO TABS: 10.0000 mg | ORAL_TABLET | Freq: Every day | ORAL | 0 refills | Status: DC

## 2021-02-25 MED ORDER — NIFEDIPINE 10 MG PO CAPS
20.0000 mg | ORAL_CAPSULE | ORAL | Status: DC | PRN
  Administered 2021-02-25: 20 mg via ORAL
  Filled 2021-02-25 (×2): qty 2

## 2021-02-25 MED ORDER — LISINOPRIL 10 MG PO TABS
10.0000 mg | ORAL_TABLET | Freq: Every day | ORAL | Status: DC
  Administered 2021-02-25: 10 mg via ORAL
  Filled 2021-02-25: qty 1

## 2021-02-25 MED ORDER — NIFEDIPINE 10 MG PO CAPS
20.0000 mg | ORAL_CAPSULE | ORAL | Status: DC | PRN
  Administered 2021-02-25: 20 mg via ORAL

## 2021-02-25 MED ORDER — NIFEDIPINE ER 30 MG PO TB24: 30.0000 mg | ORAL_TABLET | Freq: Two times a day (BID) | ORAL | 0 refills | Status: DC

## 2021-02-25 MED ORDER — PRENATAL MULTIVITAMIN CH: 1.0000 | ORAL_TABLET | Freq: Every day | ORAL | 4 refills | Status: AC

## 2021-02-25 MED ORDER — LABETALOL HCL 5 MG/ML IV SOLN: 40.0000 mg | INTRAVENOUS | Status: DC | PRN

## 2021-02-25 NOTE — Lactation Note (Signed)
This note was copied from a baby's chart. Lactation Consultation Note  Patient Name: Joanne Waller MCNOB'S Date: 02/25/2021 Reason for consult: Follow-up assessment;Mother's request;Early term 37-38.6wks;Breastfeeding assistance (CHTN Lasix, Nifedipine, Anemia Ferrous Sulfate) Age:34 hours  Mom feeding plan EBM and formula feeding with a bottle. Mom pumped x1 yesterday and today. LC encouraged mom to pump consistently to maintain her milk supply 8-12x 24hr period, 2 x at night.   Mom plans to purchase electric pump on discharge. Mom to use manual pump for now q 3hrs for 10 min each breast.  Infant taking adequate volume per feeding. Mom aware to offer EBM first followed by formula. No EBM noted so far. Mom to keep up stimulation with breast massage, hand expression and pumping.   All questions answered at the end of the visit.  Mom to follow up with outpatient [facility] services as she works on pumping/ stimulation to maintain and increase her milk supply.  Maternal Data Has patient been taught Hand Expression?: Yes  Feeding Mother's Current Feeding Choice: Breast Milk and Formula Nipple Type: Slow - flow  LATCH Score                    Lactation Tools Discussed/Used Tools: Pump;Flanges Flange Size: 24 Breast pump type: Manual Pump Education: Setup, frequency, and cleaning;Milk Storage Reason for Pumping: increase stimulation Pumping frequency: every 3 hrs for 10 min  Interventions Interventions: Breast feeding basics reviewed;Education;Expressed [employer] brochure;Infant Driven Feeding Algorithm education;Hand express;Coconut oil;Hand pump;DEBP  Discharge Discharge Education: Engorgement and breast care;Warning signs for feeding baby;Outpatient recommendation Pump: Manual WIC Program: Yes  Consult Status Consult Status: Complete Date: 02/26/21 Follow-up type: In-patient    Joanne Waller  Joanne Waller 02/25/2021, 11:23 AM

## 2021-03-02 ENCOUNTER — Other Ambulatory Visit: Payer: Self-pay

## 2021-03-02 ENCOUNTER — Ambulatory Visit (INDEPENDENT_AMBULATORY_CARE_PROVIDER_SITE_OTHER): Payer: BC Managed Care – PPO | Admitting: *Deleted

## 2021-03-02 VITALS — BP 132/91 | HR 118 | Ht 70.5 in | Wt 264.5 lb

## 2021-03-02 DIAGNOSIS — I10 Essential (primary) hypertension: Secondary | ICD-10-CM

## 2021-03-02 DIAGNOSIS — E119 Type 2 diabetes mellitus without complications: Secondary | ICD-10-CM

## 2021-03-02 MED ORDER — LISINOPRIL 20 MG PO TABS: 20.0000 mg | ORAL_TABLET | Freq: Every day | ORAL | 0 refills | Status: DC

## 2021-03-02 MED ORDER — DEXCOM G6 SENSOR MISC: 0 refills | Status: DC

## 2021-03-02 NOTE — Progress Notes (Signed)
162/118 this am, 182/ 104 yesterday. States has been high since she went home. States she has intermittent headaches, none today. Denies edema. Is taking Lisinopril and procardia.  Bp today in office 131/91 and 132/91.  Reported patient bp in office and patient's reports of bp at home. Orders to increase Lisinopril to 20mg  daily, continue Procardia as ordered. Also nurse visit one week and bring home bp machine with her. Patient also request refill of Dexcom sensors. Consulted with , Diabetes educator who informed patient now that she is not pregnant it may not be covered but we can send refill. We also discussed with her that she will start going back to her PCP for follow up of her CHTN and DM after postpartum visit. She voices understanding. Jennie Bolar,RN

## 2021-03-02 NOTE — Progress Notes (Signed)
Patient seen and assessed by nursing staff.  Agree with documentation and plan.  

## 2021-03-04 ENCOUNTER — Telehealth: Payer: Self-pay | Admitting: Radiology

## 2021-03-04 NOTE — Telephone Encounter (Signed)
Left message for patient to reply back to mychart message sent regarding FMLA paperwork for clarification.

## 2021-03-07 DIAGNOSIS — Z6839 Body mass index (BMI) 39.0-39.9, adult: Secondary | ICD-10-CM | POA: Diagnosis not present

## 2021-03-07 DIAGNOSIS — Z0000 Encounter for general adult medical examination without abnormal findings: Secondary | ICD-10-CM | POA: Diagnosis not present

## 2021-03-07 DIAGNOSIS — E1165 Type 2 diabetes mellitus with hyperglycemia: Secondary | ICD-10-CM | POA: Diagnosis not present

## 2021-03-07 DIAGNOSIS — I10 Essential (primary) hypertension: Secondary | ICD-10-CM | POA: Diagnosis not present

## 2021-03-07 DIAGNOSIS — E668 Other obesity: Secondary | ICD-10-CM | POA: Diagnosis not present

## 2021-03-08 ENCOUNTER — Other Ambulatory Visit: Payer: Self-pay | Admitting: Internal Medicine

## 2021-03-08 ENCOUNTER — Telehealth (HOSPITAL_COMMUNITY): Payer: Self-pay | Admitting: *Deleted

## 2021-03-08 DIAGNOSIS — E1165 Type 2 diabetes mellitus with hyperglycemia: Secondary | ICD-10-CM | POA: Diagnosis not present

## 2021-03-08 DIAGNOSIS — I10 Essential (primary) hypertension: Secondary | ICD-10-CM | POA: Diagnosis not present

## 2021-03-08 DIAGNOSIS — Z0000 Encounter for general adult medical examination without abnormal findings: Secondary | ICD-10-CM | POA: Diagnosis not present

## 2021-03-08 DIAGNOSIS — E668 Other obesity: Secondary | ICD-10-CM | POA: Diagnosis not present

## 2021-03-08 DIAGNOSIS — E559 Vitamin D deficiency, unspecified: Secondary | ICD-10-CM | POA: Diagnosis not present

## 2021-03-08 NOTE — Telephone Encounter (Signed)
Voice mail is full.  Odis Hollingshead, RN 03-08-21 at 3:08pm

## 2021-03-09 ENCOUNTER — Encounter: Payer: Self-pay | Admitting: *Deleted

## 2021-03-09 ENCOUNTER — Ambulatory Visit: Payer: BC Managed Care – PPO

## 2021-03-09 LAB — COMPLETE METABOLIC PANEL WITH GFR
AG Ratio: 1.3 (calc) (ref 1.0–2.5)
ALT: 19 U/L (ref 6–29)
AST: 19 U/L (ref 10–30)
Albumin: 4 g/dL (ref 3.6–5.1)
Alkaline phosphatase (APISO): 83 U/L (ref 31–125)
BUN: 10 mg/dL (ref 7–25)
CO2: 25 mmol/L (ref 20–32)
Calcium: 9.4 mg/dL (ref 8.6–10.2)
Chloride: 102 mmol/L (ref 98–110)
Creat: 0.75 mg/dL (ref 0.50–0.97)
Globulin: 3.2 g/dL (calc) (ref 1.9–3.7)
Glucose, Bld: 134 mg/dL — ABNORMAL HIGH (ref 65–99)
Potassium: 4.6 mmol/L (ref 3.5–5.3)
Sodium: 137 mmol/L (ref 135–146)
Total Bilirubin: 0.7 mg/dL (ref 0.2–1.2)
Total Protein: 7.2 g/dL (ref 6.1–8.1)
eGFR: 108 mL/min/{1.73_m2} (ref >=60)

## 2021-03-09 LAB — CBC
HCT: 40.5 % (ref 35.0–45.0)
Hemoglobin: 13.2 g/dL (ref 11.7–15.5)
MCH: 26.5 pg — ABNORMAL LOW (ref 27.0–33.0)
MCHC: 32.6 g/dL (ref 32.0–36.0)
MCV: 81.2 fL (ref 80.0–100.0)
MPV: 10.8 fL (ref 7.5–12.5)
Platelets: 461 10*3/uL — ABNORMAL HIGH (ref 140–400)
RBC: 4.99 10*6/uL (ref 3.80–5.10)
RDW: 13.7 % (ref 11.0–15.0)
WBC: 7.2 10*3/uL (ref 3.8–10.8)

## 2021-03-09 LAB — LIPID PANEL
Cholesterol: 203 mg/dL — ABNORMAL HIGH (ref <200)
HDL: 39 mg/dL — ABNORMAL LOW (ref >=50)
LDL Cholesterol (Calc): 134 mg/dL (calc) — ABNORMAL HIGH
Non-HDL Cholesterol (Calc): 164 mg/dL (calc) — ABNORMAL HIGH (ref <130)
Total CHOL/HDL Ratio: 5.2 (calc) — ABNORMAL HIGH (ref <5.0)
Triglycerides: 167 mg/dL — ABNORMAL HIGH (ref <150)

## 2021-03-09 LAB — VITAMIN D 25 HYDROXY (VIT D DEFICIENCY, FRACTURES): Vit D, 25-Hydroxy: 17 ng/mL — ABNORMAL LOW (ref 30–100)

## 2021-03-09 LAB — TSH: TSH: 0.25 mIU/L — ABNORMAL LOW

## 2021-03-13 DIAGNOSIS — M5489 Other dorsalgia: Secondary | ICD-10-CM | POA: Diagnosis not present

## 2021-03-15 ENCOUNTER — Ambulatory Visit: Payer: BC Managed Care – PPO

## 2021-03-15 ENCOUNTER — Telehealth: Payer: Self-pay

## 2021-03-15 NOTE — Telephone Encounter (Signed)
Patient did not keep BP check appt today. Called pt. Pt states she cannot come this afternoon. Next available is 03/21/21. Pt agreeable to reschedule to that day. Pt reports headaches have resolved. States she has seen PCP since last visit. PCP discontinued Nifedipine and began different medication; pt cannot recall name of medication.

## 2021-03-21 ENCOUNTER — Other Ambulatory Visit: Payer: Self-pay

## 2021-03-21 ENCOUNTER — Ambulatory Visit (INDEPENDENT_AMBULATORY_CARE_PROVIDER_SITE_OTHER): Payer: BC Managed Care – PPO

## 2021-03-21 VITALS — BP 130/86 | HR 98 | Wt 257.4 lb

## 2021-03-21 DIAGNOSIS — Z0130 Encounter for examination of blood pressure without abnormal findings: Secondary | ICD-10-CM

## 2021-03-21 NOTE — Progress Notes (Signed)
Blood Pressure Check Visit  Joanne Waller is here for blood pressure check following induced vaginal delivery on 02/23/21. BP today is 130/86. Patient denies any dizziness, blurred vision, headache, shortness of breath, peripheral edema. Patient states she was instructed to bring her BP cuff to today's appointment to compare it with the accuracy of our BP machine however patient forgot to bring her BP cuff. I instructed patient to bring her BP cuff to her postpartum appointment. I reviewed the signs and symptoms of pre-eclampsia with patient. Patient denies any other concerns or questions.   Cline Crock, RN 03/21/2021

## 2021-03-22 DIAGNOSIS — E668 Other obesity: Secondary | ICD-10-CM | POA: Diagnosis not present

## 2021-03-22 DIAGNOSIS — E119 Type 2 diabetes mellitus without complications: Secondary | ICD-10-CM | POA: Diagnosis not present

## 2021-03-22 DIAGNOSIS — I10 Essential (primary) hypertension: Secondary | ICD-10-CM | POA: Diagnosis not present

## 2021-03-29 ENCOUNTER — Encounter: Payer: Self-pay | Admitting: Obstetrics and Gynecology

## 2021-03-29 ENCOUNTER — Encounter: Payer: Self-pay | Admitting: Family Medicine

## 2021-03-29 ENCOUNTER — Ambulatory Visit (INDEPENDENT_AMBULATORY_CARE_PROVIDER_SITE_OTHER): Payer: BC Managed Care – PPO | Admitting: Obstetrics and Gynecology

## 2021-03-29 ENCOUNTER — Other Ambulatory Visit: Payer: Self-pay

## 2021-03-29 ENCOUNTER — Ambulatory Visit: Payer: BC Managed Care – PPO | Admitting: Obstetrics & Gynecology

## 2021-03-29 NOTE — Progress Notes (Signed)
Post Partum Visit Note  Joanne Waller is a 36 y.o. G2P2012 female who presents for a postpartum visit. She is 4 weeks 6 days postpartum following a normal spontaneous vaginal delivery.  I have fully reviewed the prenatal and intrapartum course. The delivery was at 37w 1d.  Anesthesia: epidural. Postpartum course has been uncomplicated. Baby is doing well. Baby is feeding by both breast and bottle - Enfamil Gentle . Bleeding no bleeding. Bowel function is normal. Bladder function is normal. Patient is not sexually active. Contraception method is none. Postpartum depression screening: negative.   The pregnancy intention screening data noted above was reviewed. Potential methods of contraception were discussed. The patient elected to proceed with nothing.   Edinburgh Postnatal Depression Scale - 03/29/21 1623       Edinburgh Postnatal Depression Scale:  In the Past 7 Days   I have been able to laugh and see the funny side of things. 0    I have looked forward with enjoyment to things. 0    I have blamed myself unnecessarily when things went wrong. 0    I have been anxious or worried for no good reason. 0    I have felt scared or panicky for no good reason. 0    Things have been getting on top of me. 0    I have been so unhappy that I have had difficulty sleeping. 0    I have felt sad or miserable. 0    I have been so unhappy that I have been crying. 0    The thought of harming myself has occurred to me. 0    Edinburgh Postnatal Depression Scale Total 0             Health Maintenance Due  Topic Date Due   COVID-19 Vaccine (1) Never done   FOOT EXAM  Never done   OPHTHALMOLOGY EXAM  Never done   INFLUENZA VACCINE  Never done   HEMOGLOBIN A1C  02/15/2021    The following portions of the patient's history were reviewed and updated as appropriate: allergies, current medications, past family history, past medical history, past social history, past surgical history, and problem  list.  Review of Systems Pertinent items are noted in HPI.  Objective:  BP 128/89    Pulse (!) 103    Wt 255 lb 3.2 oz (115.8 kg)    LMP 06/09/2020    SpO2 98%    Breastfeeding Yes    BMI 36.10 kg/m    General:  alert, cooperative, and no distress   Breasts:  not indicated  Lungs: clear to auscultation bilaterally  Heart:  Regular rhythm, mild tachycardia, pt asymptomatic  Abdomen: soft, non-tender; bowel sounds normal; no masses,  no organomegaly   Wound N/a  GU exam:   deferred       Assessment:    Normal postpartum exam.   Plan:   Essential components of care per ACOG recommendations:  1.  Mood and well being: Patient with negative depression screening today. Reviewed local resources for support.  - Patient tobacco use? No.   - hx of drug use? No.    2. Infant care and feeding:  -Patient currently breastmilk feeding? Yes. Reviewed importance of draining breast regularly to support lactation.  -Social determinants of health (SDOH) reviewed in EPIC. No concerns.  3. Sexuality, contraception and birth spacing - Patient does not want a pregnancy in the next year.  Desired family size is 3 children.  -  Reviewed forms of contraception in tiered fashion. Patient desired no method today.   - Discussed birth spacing of 18 months  4. Sleep and fatigue -Encouraged family/partner/community support of 4 hrs of uninterrupted sleep to help with mood and fatigue  5. Physical Recovery  - Discussed patients delivery and complications. She describes her labor as good. - Patient had a Vaginal problems after delivery including postpartum preeclampsia . Patient had a 2nd degree laceration. Perineal healing reviewed. Patient expressed understanding - Patient has urinary incontinence? No. - Patient is safe to resume physical and sexual activity  6.  Health Maintenance - HM due items addressed Yes - Last pap smear  Diagnosis  Date Value Ref Range Status  05/13/2020   Final   - Negative  for intraepithelial lesion or malignancy (NILM)   Pap smear not done at today's visit.  -Breast Cancer screening indicated? No.   7. Chronic Disease/Pregnancy Condition follow up: Hypertension and diabetes  - PCP follow up: already done and is managing Type 2 DM and HTN  Warden Fillers, MD [facility] for [employer], [facility]

## 2021-04-03 ENCOUNTER — Other Ambulatory Visit: Payer: Self-pay | Admitting: Family Medicine

## 2021-04-03 DIAGNOSIS — I10 Essential (primary) hypertension: Secondary | ICD-10-CM

## 2021-04-07 ENCOUNTER — Encounter: Payer: Self-pay | Admitting: Radiology

## 2021-05-05 ENCOUNTER — Other Ambulatory Visit: Payer: Self-pay | Admitting: Family Medicine

## 2021-05-05 DIAGNOSIS — I10 Essential (primary) hypertension: Secondary | ICD-10-CM | POA: Diagnosis not present

## 2021-05-05 DIAGNOSIS — E1165 Type 2 diabetes mellitus with hyperglycemia: Secondary | ICD-10-CM | POA: Diagnosis not present

## 2021-05-05 DIAGNOSIS — Z6839 Body mass index (BMI) 39.0-39.9, adult: Secondary | ICD-10-CM | POA: Diagnosis not present

## 2021-05-05 DIAGNOSIS — Z716 Tobacco abuse counseling: Secondary | ICD-10-CM | POA: Diagnosis not present

## 2021-05-05 DIAGNOSIS — F17210 Nicotine dependence, cigarettes, uncomplicated: Secondary | ICD-10-CM | POA: Diagnosis not present

## 2021-06-01 ENCOUNTER — Other Ambulatory Visit: Payer: Self-pay | Admitting: Family Medicine

## 2021-09-06 IMAGING — MR MR HEAD WO/W CM
19 series · 48 of 48 positions shown · IV contrast (multihance)
Comparison: No pertinent prior exams available for comparison.

CLINICAL DATA: Idiopathic hyperprolactinemia.

EXAM:
MRI HEAD WITHOUT AND WITH CONTRAST
TECHNIQUE: Multiplanar, multiecho pulse sequences of the brain and surrounding
structures were obtained without and with intravenous contrast.
CONTRAST:  15mL MULTIHANCE GADOBENATE DIMEGLUMINE 529 MG/ML IV SOLN

[Series 5: T1 · sagittal · 4.0mm · 0.78mm/px · 2 of 28 slices shown (1 of 5)]
[im 1/28]
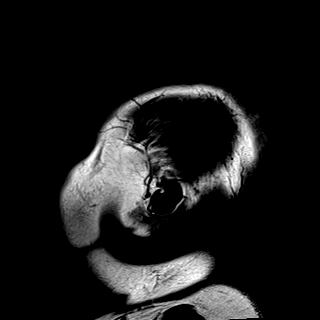
[im 28/28]
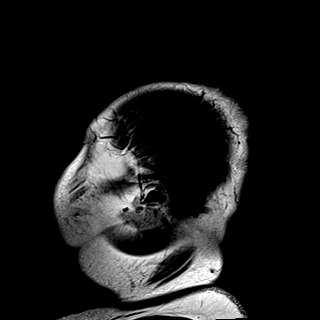

[Series 6: T2 · axial · 4.0mm · 0.36mm/px · z∈[-69,+81]mm · 2 of 30 slices shown]
[im 1/30]
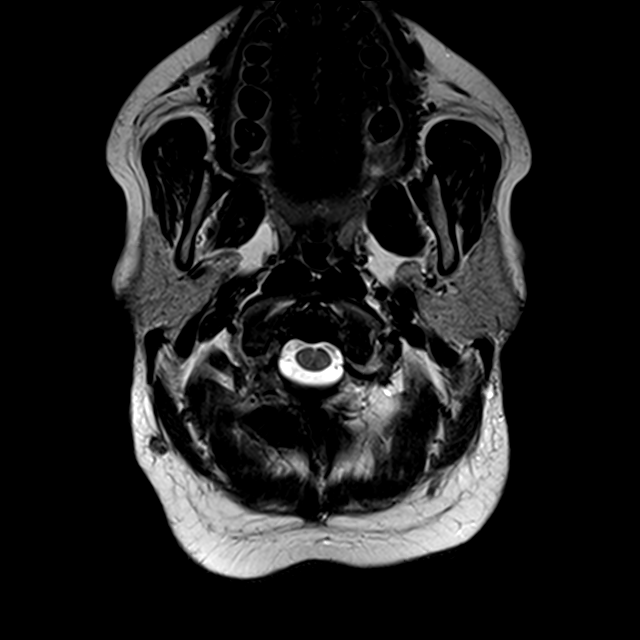
[im 30/30]
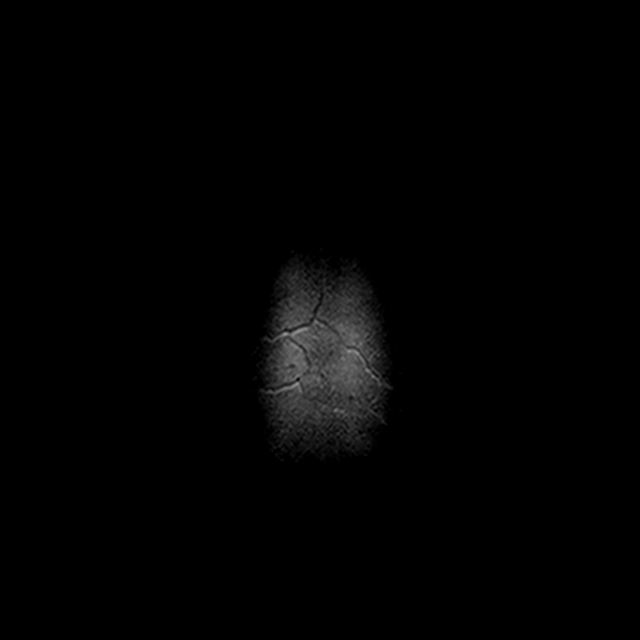

[Series 7: DWI · axial · 3.0mm · 1.44mm/px · z∈[-75,+85]mm · 7 of 84 slices shown (1 of 2)]
[im 1/84]
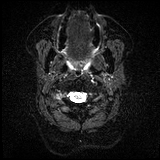
[im 14/84]
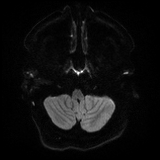
[im 28/84]
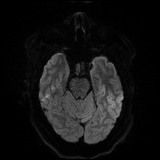
[im 42/84]
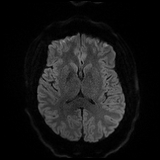
[im 56/84]
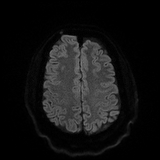
[im 70/84]
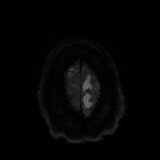
[im 84/84]
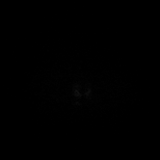

[Series 8: DWI · axial · 3.0mm · 1.44mm/px · z∈[-75,+85]mm · 3 of 42 slices shown (2 of 2)]
[im 1/42]
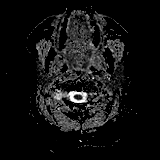
[im 21/42]
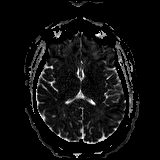
[im 42/42]
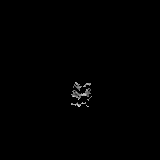

[Series 10: swi_images · axial · 1.5mm · 0.90mm/px · z∈[-66,+76]mm · 7 of 96 slices shown]
[im 1/96]
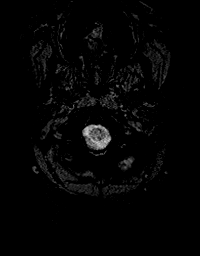
[im 16/96]
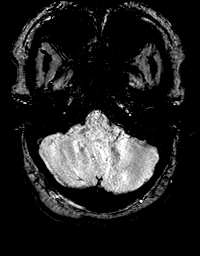
[im 32/96]
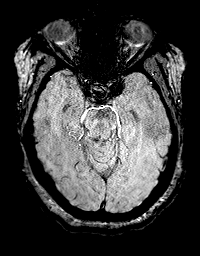
[im 48/96]
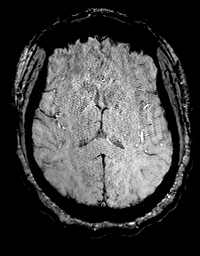
[im 64/96]
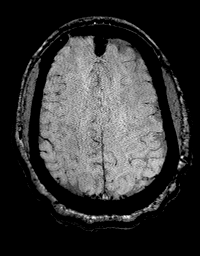
[im 80/96]
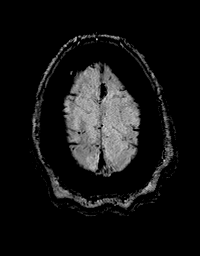
[im 96/96]
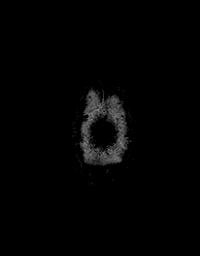

[Series 11: FLAIR · axial · 3.0mm · 0.72mm/px · z∈[-63,+75]mm · 2 of 24 slices shown]
[im 1/24]
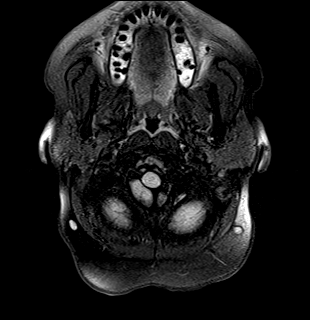
[im 24/24]
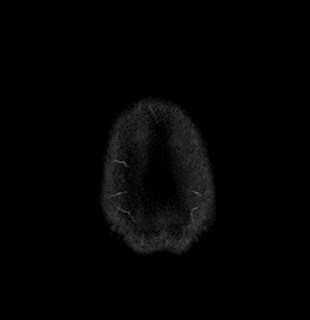

[Series 12: T1 · sagittal · 3.0mm · 0.42mm/px · 1 of 13 slices shown (2 of 5)]
[im 1/13]
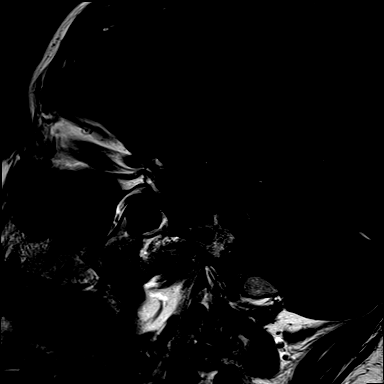

[Series 13: T1 · coronal · 3.0mm · 0.42mm/px · 1 of 16 slices shown (3 of 5)]
[im 1/16]
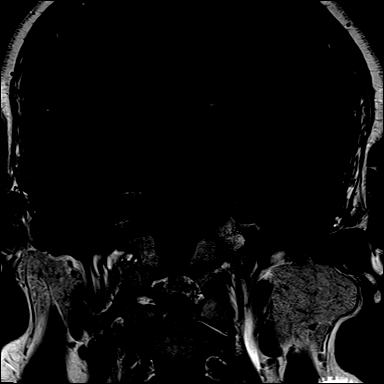

[Series 14: T1 · coronal · non-contrast · 3.0mm · 0.62mm/px · 1 of 19 slices shown (4 of 5)]
[im 1/19]
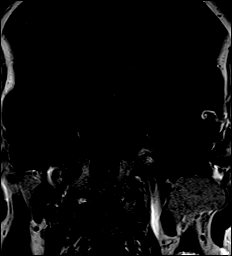

[Series 15: cor post dyn · coronal · 3.0mm · 0.62mm/px · 1 of 9 slices shown (1 of 6)]
[im 1/9]
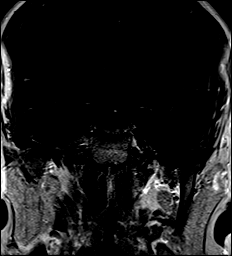

[Series 16: cor post dyn · coronal · 3.0mm · 0.62mm/px · 1 of 9 slices shown (2 of 6)]
[im 1/9]
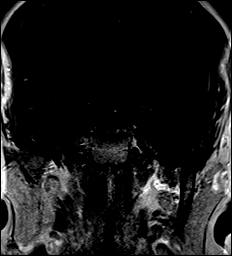

[Series 17: cor post dyn · coronal · 3.0mm · 0.62mm/px · 1 of 9 slices shown (3 of 6)]
[im 1/9]
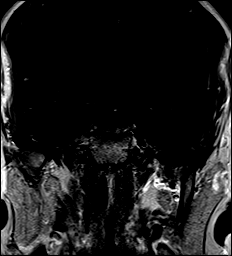

[Series 18: cor post dyn · coronal · 3.0mm · 0.62mm/px · 1 of 9 slices shown (4 of 6)]
[im 1/9]
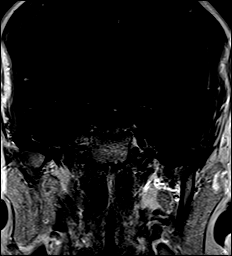

[Series 19: cor post dyn · coronal · 3.0mm · 0.62mm/px · 1 of 9 slices shown (5 of 6)]
[im 1/9]
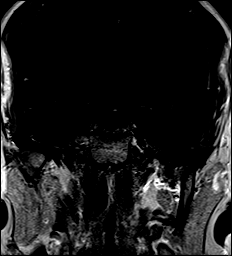

[Series 20: cor post dyn · coronal · 3.0mm · 0.62mm/px · 1 of 9 slices shown (6 of 6)]
[im 1/9]
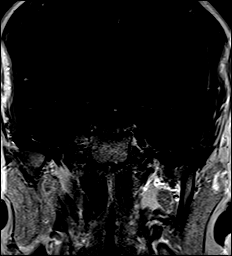

[Series 21: T1 post-contrast · sagittal · 3.0mm · 0.42mm/px · 1 of 13 slices shown (1 of 3)]
[im 1/13]
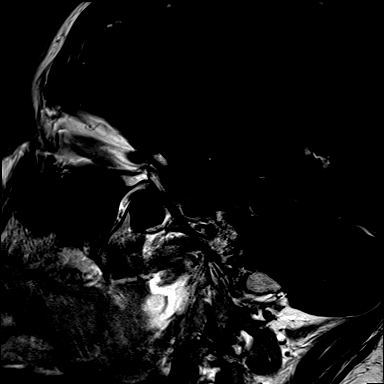

[Series 22: T1 post-contrast · coronal · 3.0mm · 0.42mm/px · 1 of 16 slices shown (2 of 3)]
[im 1/16]
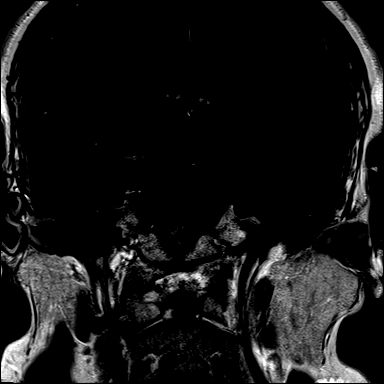

[Series 23: T1 · axial · 1.0mm · 0.86mm/px · z∈[-72,+71]mm · 11 of 144 slices shown (5 of 5)]
[im 1/144]
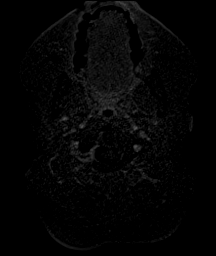
[im 15/144]
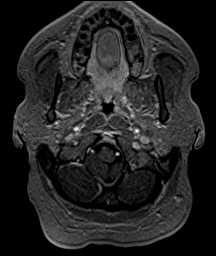
[im 29/144]
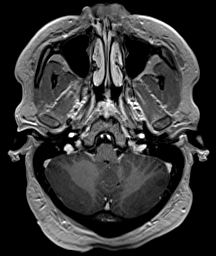
[im 43/144]
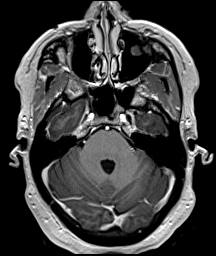
[im 58/144]
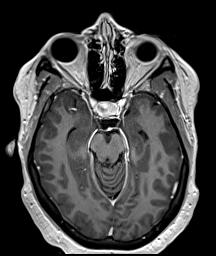
[im 72/144]
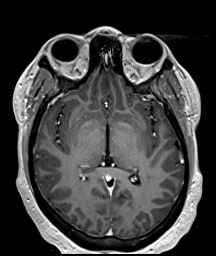
[im 86/144]
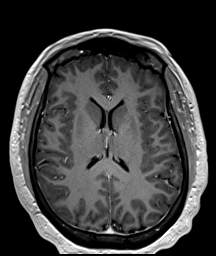
[im 101/144]
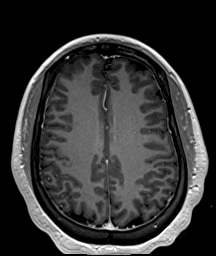
[im 115/144]
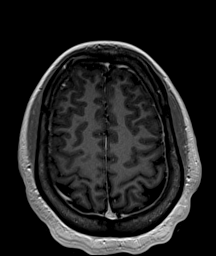
[im 129/144]
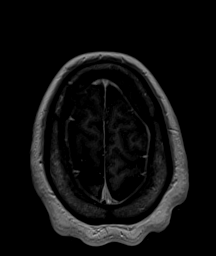
[im 144/144]
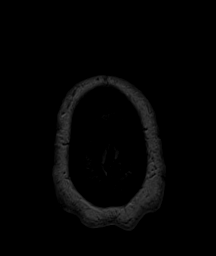

[Series 24: T1 post-contrast · coronal · 4.0mm · 0.47mm/px · 3 of 39 slices shown (3 of 3)]
[im 1/39]
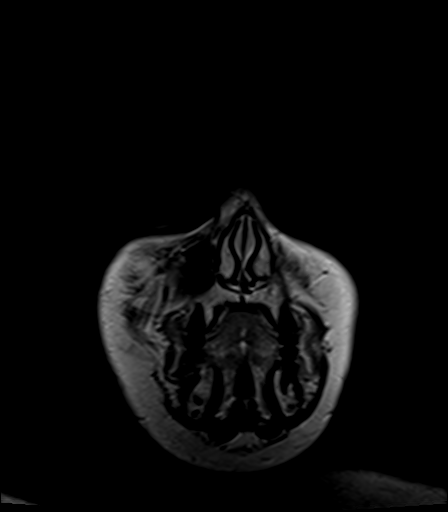
[im 20/39]
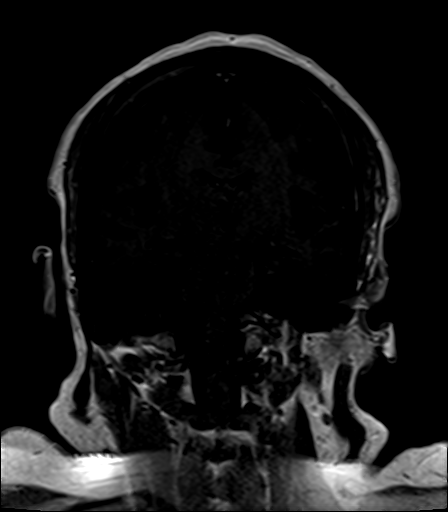
[im 39/39]
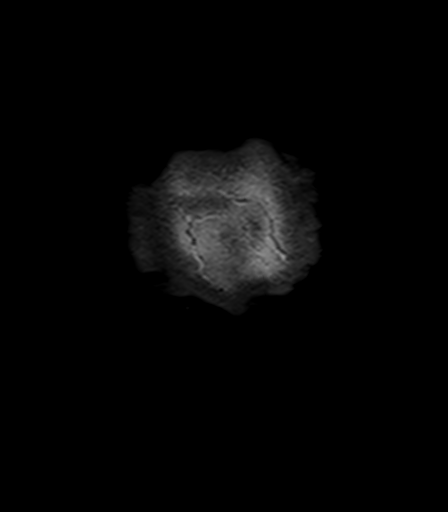

[48 of 48 positions shown; findings below may reference images not displayed]

FINDINGS: Brain:

Cerebral volume is normal.

0.8 x 0.8 x 0.5 cm precontrast T1 hyperintense lesion centered
within the right aspect of the pituitary gland (series 12, image 7)
(series 13, image 9). The lesion demonstrates T2 intermediate to low
signal. Precontrast T1 hyperintensity limits evaluation for
enhancement, although there is apparent progressive enhancement.
There is minimal bulging of the pituitary gland into the suprasellar
region. No mass effect upon the optic chiasm. No evidence of
cavernous sinus invasion. The pituitary stalk is minimally deviated
to the left.

No focal parenchymal signal or abnormal enhancement abnormality
identified elsewhere.

There is no acute infarct.

No evidence of intracranial mass.

No chronic intracranial blood products.

No extra-axial fluid collection.

No midline shift.

Vascular: Expected proximal arterial flow voids.

Skull and upper cervical spine: No focal marrow lesion.

Sinuses/Orbits: Visualized orbits show no acute finding. Trace
ethmoid sinus mucosal thickening. Small left maxillary sinus mucous
retention cyst.

These results will be called to the ordering clinician or
representative by the Radiologist Assistant, and communication
documented in the PACS or [REDACTED].
IMPRESSION: 0.8 cm precontrast T1 hyperintense lesion within the right aspect of
the pituitary gland. Given the provided history of
hyperprolactinemia, this likely reflects a pituitary adenoma
containing proteinaceous material and/or nonacute blood products.
There is slight bulging of the pituitary gland into the suprasellar
region. No mass effect upon the optic chiasm. No evidence of
cavernous sinus invasion. The pituitary stalk is minimally deviated
to the left.

Otherwise unremarkable MRI appearance of the brain.

## 2021-10-03 ENCOUNTER — Ambulatory Visit: Payer: BC Managed Care – PPO | Admitting: Obstetrics and Gynecology

## 2021-12-29 DIAGNOSIS — J069 Acute upper respiratory infection, unspecified: Secondary | ICD-10-CM | POA: Diagnosis not present

## 2022-04-16 ENCOUNTER — Other Ambulatory Visit (HOSPITAL_COMMUNITY)
Admission: RE | Admit: 2022-04-16 | Discharge: 2022-04-16 | Disposition: A | Discharge status: Home / Self Care | Payer: BC Managed Care – PPO | Source: Ambulatory Visit | Attending: Obstetrics and Gynecology | Admitting: Obstetrics and Gynecology

## 2022-04-16 ENCOUNTER — Ambulatory Visit (INDEPENDENT_AMBULATORY_CARE_PROVIDER_SITE_OTHER): Payer: BC Managed Care – PPO | Admitting: *Deleted

## 2022-04-16 VITALS — BP 119/83 | HR 102 | Wt 265.0 lb

## 2022-04-16 DIAGNOSIS — Z3201 Encounter for pregnancy test, result positive: Secondary | ICD-10-CM | POA: Diagnosis not present

## 2022-04-16 DIAGNOSIS — N898 Other specified noninflammatory disorders of vagina: Secondary | ICD-10-CM | POA: Diagnosis not present

## 2022-04-16 DIAGNOSIS — O26891 Other specified pregnancy related conditions, first trimester: Secondary | ICD-10-CM | POA: Diagnosis not present

## 2022-04-16 DIAGNOSIS — O0990 Supervision of high risk pregnancy, unspecified, unspecified trimester: Secondary | ICD-10-CM

## 2022-04-16 MED ORDER — NIFEDIPINE ER OSMOTIC RELEASE 30 MG PO TB24: 30.0000 mg | ORAL_TABLET | Freq: Every day | ORAL | 1 refills | Status: DC

## 2022-04-16 NOTE — Progress Notes (Signed)
SUBJECTIVE:  37 y.o. female complains of white and foul vaginal discharge for 2 week(s). Denies abnormal vaginal bleeding or significant pelvic pain or fever. No UTI symptoms. Denies history of known exposure to STD.  No LMP recorded.  OBJECTIVE:  She appears well, afebrile. Urine dipstick: not done.  ASSESSMENT:  Vaginal Discharge  Vaginal Odor   PLAN:  GC, chlamydia, trichomonas, BVAG, CVAG probe sent to lab. Treatment: To be determined once lab results are received ROV prn if symptoms persist or worsen.  [name] presents today for UPT. She has no unusual complaints. LMP: 03/09/22    OBJECTIVE: Appears well, in no apparent distress.  OB History     Gravida  3   Para  2   Term  2   Preterm      AB  1   Living  2      SAB  1   IAB      Ectopic      Multiple  0   Live Births  2          Home UPT Result: positive In-Office UPT result: positive I have reviewed the patient's medical, obstetrical, social, and family histories, and medications.   ASSESSMENT: Positive pregnancy test  PLAN Prenatal care to be completed at: [facility]  Pt is currently on Lisinopril and Amlodipine for BP. Consulted with Dr. Mikel Cella. Orders to DC current meds and start Procardia 30 mg XL daily and return for BP check in 2 weeks. Pt given BP cuff from office stock and instructed to take BP daily and report high values over 140/90. Pt is currently on insulin for diabetes. Instructed to continue insulin at current dose. Plan to check HgbA1C at [facility] Intake.

## 2022-04-18 LAB — CERVICOVAGINAL ANCILLARY ONLY
Bacterial Vaginitis (gardnerella): POSITIVE — AB
Candida Glabrata: NEGATIVE
Candida Vaginitis: POSITIVE — AB
Chlamydia: NEGATIVE
Comment: NEGATIVE
Comment: NEGATIVE
Comment: NEGATIVE
Comment: NEGATIVE
Comment: NEGATIVE
Comment: NORMAL
Neisseria Gonorrhea: NEGATIVE
Trichomonas: NEGATIVE

## 2022-04-19 ENCOUNTER — Other Ambulatory Visit: Payer: Self-pay | Admitting: *Deleted

## 2022-04-19 DIAGNOSIS — B3731 Acute candidiasis of vulva and vagina: Secondary | ICD-10-CM

## 2022-04-19 DIAGNOSIS — N760 Acute vaginitis: Secondary | ICD-10-CM

## 2022-04-19 MED ORDER — TERCONAZOLE 0.8 % VA CREA: 1.0000 | TOPICAL_CREAM | Freq: Every day | VAGINAL | 0 refills | Status: DC

## 2022-04-19 MED ORDER — METRONIDAZOLE 0.75 % VA GEL: 1.0000 | Freq: Every day | VAGINAL | 1 refills | Status: DC

## 2022-04-19 NOTE — Progress Notes (Signed)
TC from pt requesting RX BV and Yeast noted on MyChart results. RXs per protocol.

## 2022-04-30 ENCOUNTER — Ambulatory Visit (INDEPENDENT_AMBULATORY_CARE_PROVIDER_SITE_OTHER): Payer: BC Managed Care – PPO

## 2022-04-30 VITALS — BP 124/84 | HR 93 | Ht 71.0 in

## 2022-04-30 DIAGNOSIS — Z0130 Encounter for examination of blood pressure without abnormal findings: Secondary | ICD-10-CM

## 2022-04-30 NOTE — Progress Notes (Signed)
Subjective:  Joanne Waller is a 37 y.o. female here for BP check.   Hypertension ROS: taking medications as instructed, no medication side effects noted, no TIA's, no chest pain on exertion, no dyspnea on exertion, and no swelling of ankles.    Objective:  BP 124/84   Pulse 93   Ht 5\' 11"  (1.803 m)   LMP 03/09/2022   Breastfeeding No   BMI 36.96 kg/m   Appearance alert, well appearing, and in no distress. General exam BP noted to be well controlled today in office.    Assessment:   Blood Pressure stable.   Plan:  New OB Interview 05/07/2022 New OB Visit with Dr. Jodi Mourning 05/30/2022

## 2022-05-07 ENCOUNTER — Other Ambulatory Visit (INDEPENDENT_AMBULATORY_CARE_PROVIDER_SITE_OTHER): Payer: BC Managed Care – PPO

## 2022-05-07 ENCOUNTER — Other Ambulatory Visit: Payer: Self-pay | Admitting: *Deleted

## 2022-05-07 ENCOUNTER — Ambulatory Visit (INDEPENDENT_AMBULATORY_CARE_PROVIDER_SITE_OTHER): Payer: BC Managed Care – PPO | Admitting: *Deleted

## 2022-05-07 VITALS — BP 137/90 | HR 102 | Ht 71.0 in | Wt 265.0 lb

## 2022-05-07 DIAGNOSIS — O3680X0 Pregnancy with inconclusive fetal viability, not applicable or unspecified: Secondary | ICD-10-CM

## 2022-05-07 DIAGNOSIS — Z3480 Encounter for supervision of other normal pregnancy, unspecified trimester: Secondary | ICD-10-CM | POA: Diagnosis not present

## 2022-05-07 DIAGNOSIS — Z3A01 Less than 8 weeks gestation of pregnancy: Secondary | ICD-10-CM

## 2022-05-07 DIAGNOSIS — O10019 Pre-existing essential hypertension complicating pregnancy, unspecified trimester: Secondary | ICD-10-CM

## 2022-05-07 DIAGNOSIS — Z1339 Encounter for screening examination for other mental health and behavioral disorders: Secondary | ICD-10-CM | POA: Diagnosis not present

## 2022-05-07 DIAGNOSIS — Z3481 Encounter for supervision of other normal pregnancy, first trimester: Secondary | ICD-10-CM

## 2022-05-07 DIAGNOSIS — O24119 Pre-existing diabetes mellitus, type 2, in pregnancy, unspecified trimester: Secondary | ICD-10-CM

## 2022-05-07 DIAGNOSIS — O0990 Supervision of high risk pregnancy, unspecified, unspecified trimester: Secondary | ICD-10-CM

## 2022-05-07 MED ORDER — DEXCOM G6 TRANSMITTER MISC: 1.0000 | 11 refills | Status: DC

## 2022-05-07 MED ORDER — DEXCOM G6 SENSOR MISC: 1.0000 | 11 refills | Status: DC

## 2022-05-07 NOTE — Progress Notes (Unsigned)
New OB Intake  I connected with Joanne Waller  on 05/07/22 at  3:10 PM EDT by In Person Visit and verified that I am speaking with the correct person using two identifiers. Nurse is located at [facility] and pt is located at [place].  I discussed the limitations, risks, security and privacy concerns of performing an evaluation and management service by telephone and the availability of in person appointments. I also discussed with the patient that there may be a patient responsible charge related to this service. The patient expressed understanding and agreed to proceed.  I explained I am completing New OB Intake today. We discussed EDD of 12/26/22 that is based [place] on 05/07/22 at [redacted]w[redacted]d. Pt is G4/P2. I reviewed her allergies, medications, Medical/Surgical/OB history, and appropriate screenings. I informed her of [facility] services. [facility] information placed in AVS. Based on history, this is a high risk pregnancy.  Patient Active Problem List   Diagnosis Date Noted   Encounter for postpartum visit 03/29/2021   Diabetes in pregnancy 02/23/2021   Preeclampsia, severe, third trimester 02/23/2021   LGA (large for gestational age) fetus affecting management of mother 02/15/2021   Polyhydramnios in third trimester 01/26/2021   Alpha thalassemia silent carrier 10/14/2020   Pre-existing type 2 diabetes affecting pregnancy, antepartum 08/15/2020   Pre-existing essential hypertension during pregnancy, antepartum 08/15/2020    Concerns addressed today  Delivery Plans Plans to deliver at [facility]. Patient given information for [facility] Healthy Baby website for more information about [facility]. Patient is not interested in water birth. Offered upcoming OB visit with CNM to discuss further.  MyChart/Babyscripts MyChart access verified. I explained pt will have some visits in office and some virtually. Babyscripts instructions given and order placed. Patient verifies receipt of registration text/e-mail.  Account successfully created and app downloaded.  Blood Pressure Cuff/Weight Scale Pt was given BP cuff at UPT appt for cHTN and change in medication management. Explained after first prenatal appt pt will check weekly and document in 73. Patient does not have weight scale; patient may purchase if they desire to track weight weekly in Babyscripts.  Anatomy US Explained first scheduled [place] will be around 19 weeks. Anatomy US scheduled for 19wks at [facility]. Pt notified to arrive at [facility].  Labs Discussed Johnsie Cancel genetic screening with patient. Would like both Panorama and Horizon drawn at new OB visit. Routine prenatal labs needed.  COVID Vaccine Patient has not had COVID vaccine.   Social Determinants of Health Food Insecurity: Patient denies food insecurity. WIC Referral: Patient is interested in referral to [facility].  Transportation: Patient denies transportation needs. Childcare: Discussed no children allowed at ultrasound appointments. Offered childcare services; patient declines childcare services at this time.  Interested in [place]? If yes, send referral and doula dot phrase.   First visit review I reviewed new OB appt with patient. I explained they will have a provider visit that includes discussion, labs, genetic screening. Explained pt will be seen by Dr. Jodi Mourning at first visit; encounter routed to appropriate provider. Explained that patient will be seen by pregnancy navigator following visit with provider.   Penny Pia, RN 05/07/2022  3:22 PM

## 2022-05-08 LAB — CBC/D/PLT+RPR+RH+ABO+RUBIGG...
Antibody Screen: NEGATIVE
Basophils Absolute: 0.1 10*3/uL (ref 0.0–0.2)
Basos: 1 %
EOS (ABSOLUTE): 0.1 10*3/uL (ref 0.0–0.4)
Eos: 1 %
HCV Ab: NONREACTIVE
HIV Screen 4th Generation wRfx: NONREACTIVE
Hematocrit: 41.9 % (ref 34.0–46.6)
Hemoglobin: 13.7 g/dL (ref 11.1–15.9)
Hepatitis B Surface Ag: NEGATIVE
Immature Grans (Abs): 0 10*3/uL (ref 0.0–0.1)
Immature Granulocytes: 0 %
Lymphocytes Absolute: 2.8 10*3/uL (ref 0.7–3.1)
Lymphs: 31 %
MCH: 26.6 pg (ref 26.6–33.0)
MCHC: 32.7 g/dL (ref 31.5–35.7)
MCV: 81 fL (ref 79–97)
Monocytes Absolute: 0.6 10*3/uL (ref 0.1–0.9)
Monocytes: 7 %
Neutrophils Absolute: 5.4 10*3/uL (ref 1.4–7.0)
Neutrophils: 60 %
Platelets: 353 10*3/uL (ref 150–450)
RBC: 5.16 x10E6/uL (ref 3.77–5.28)
RDW: 13 % (ref 11.7–15.4)
RPR Ser Ql: NONREACTIVE
Rh Factor: POSITIVE
Rubella Antibodies, IGG: 2.08 index (ref >0.99)
WBC: 9 10*3/uL (ref 3.4–10.8)

## 2022-05-08 LAB — COMPREHENSIVE METABOLIC PANEL
ALT: 19 IU/L (ref 0–32)
AST: 10 IU/L (ref 0–40)
Albumin/Globulin Ratio: 1.3 (ref 1.2–2.2)
Albumin: 3.8 g/dL — ABNORMAL LOW (ref 3.9–4.9)
Alkaline Phosphatase: 72 IU/L (ref 44–121)
BUN/Creatinine Ratio: 13 (ref 9–23)
BUN: 8 mg/dL (ref 6–20)
Bilirubin Total: <0.2 mg/dL (ref 0.0–1.2)
CO2: 25 mmol/L (ref 20–29)
Calcium: 9.4 mg/dL (ref 8.7–10.2)
Chloride: 98 mmol/L (ref 96–106)
Creatinine, Ser: 0.63 mg/dL (ref 0.57–1.00)
Globulin, Total: 2.9 g/dL (ref 1.5–4.5)
Glucose: 267 mg/dL — ABNORMAL HIGH (ref 70–99)
Potassium: 4.6 mmol/L (ref 3.5–5.2)
Sodium: 135 mmol/L (ref 134–144)
Total Protein: 6.7 g/dL (ref 6.0–8.5)
eGFR: 119 mL/min/{1.73_m2} (ref >59)

## 2022-05-08 LAB — HEMOGLOBIN A1C
Est. average glucose Bld gHb Est-mCnc: 301 mg/dL
Hgb A1c MFr Bld: 12.1 % — ABNORMAL HIGH (ref 4.8–5.6)

## 2022-05-08 LAB — HCV INTERPRETATION

## 2022-05-08 MED ORDER — METFORMIN HCL 500 MG PO TABS: 1000.0000 mg | ORAL_TABLET | Freq: Two times a day (BID) | ORAL | 9 refills | Status: DC

## 2022-05-08 NOTE — Addendum Note (Signed)
Addended by: Gale Journey on: 05/08/2022 09:25 PM   Modules accepted: Orders

## 2022-05-09 DIAGNOSIS — F329 Major depressive disorder, single episode, unspecified: Secondary | ICD-10-CM | POA: Diagnosis not present

## 2022-05-09 DIAGNOSIS — Z0000 Encounter for general adult medical examination without abnormal findings: Secondary | ICD-10-CM | POA: Diagnosis not present

## 2022-05-09 DIAGNOSIS — E1165 Type 2 diabetes mellitus with hyperglycemia: Secondary | ICD-10-CM | POA: Diagnosis not present

## 2022-05-09 DIAGNOSIS — I10 Essential (primary) hypertension: Secondary | ICD-10-CM | POA: Diagnosis not present

## 2022-05-11 LAB — URINE CULTURE, OB REFLEX

## 2022-05-11 LAB — CULTURE, OB URINE

## 2022-05-14 ENCOUNTER — Other Ambulatory Visit: Payer: Self-pay | Admitting: *Deleted

## 2022-05-14 DIAGNOSIS — R3915 Urgency of urination: Secondary | ICD-10-CM

## 2022-05-14 MED ORDER — CEPHALEXIN 500 MG PO CAPS
500.0000 mg | ORAL_CAPSULE | Freq: Four times a day (QID) | ORAL | 2 refills | Status: DC
Start: 2022-05-14 — End: 2023-04-15

## 2022-05-14 NOTE — Progress Notes (Signed)
TC from pt reporting abnormal OB Urine and concern for urinary urgency. Results reviewed with Dr. Berton Lan. RX sent Keflex for symptomatic GBS+ UTI.

## 2022-05-30 ENCOUNTER — Encounter: Payer: BC Managed Care – PPO | Admitting: Obstetrics

## 2022-06-26 DIAGNOSIS — E1165 Type 2 diabetes mellitus with hyperglycemia: Secondary | ICD-10-CM | POA: Diagnosis not present

## 2022-06-26 DIAGNOSIS — J0100 Acute maxillary sinusitis, unspecified: Secondary | ICD-10-CM | POA: Diagnosis not present

## 2022-06-26 DIAGNOSIS — F329 Major depressive disorder, single episode, unspecified: Secondary | ICD-10-CM | POA: Diagnosis not present

## 2022-06-26 DIAGNOSIS — J019 Acute sinusitis, unspecified: Secondary | ICD-10-CM | POA: Diagnosis not present

## 2022-06-26 DIAGNOSIS — J302 Other seasonal allergic rhinitis: Secondary | ICD-10-CM | POA: Diagnosis not present

## 2022-07-01 ENCOUNTER — Other Ambulatory Visit: Payer: Self-pay | Admitting: Obstetrics and Gynecology

## 2022-07-01 DIAGNOSIS — O0990 Supervision of high risk pregnancy, unspecified, unspecified trimester: Secondary | ICD-10-CM

## 2022-07-26 DIAGNOSIS — O99210 Obesity complicating pregnancy, unspecified trimester: Secondary | ICD-10-CM | POA: Insufficient documentation

## 2022-07-26 DIAGNOSIS — O09529 Supervision of elderly multigravida, unspecified trimester: Secondary | ICD-10-CM | POA: Insufficient documentation

## 2022-08-01 ENCOUNTER — Ambulatory Visit: Payer: BC Managed Care – PPO | Attending: Obstetrics and Gynecology

## 2022-08-01 ENCOUNTER — Ambulatory Visit: Payer: BC Managed Care – PPO

## 2022-08-01 DIAGNOSIS — O99212 Obesity complicating pregnancy, second trimester: Secondary | ICD-10-CM

## 2022-08-01 DIAGNOSIS — O09522 Supervision of elderly multigravida, second trimester: Secondary | ICD-10-CM

## 2022-08-16 ENCOUNTER — Other Ambulatory Visit: Payer: Self-pay | Admitting: Internal Medicine

## 2022-08-16 DIAGNOSIS — E559 Vitamin D deficiency, unspecified: Secondary | ICD-10-CM | POA: Diagnosis not present

## 2022-08-16 DIAGNOSIS — Z0000 Encounter for general adult medical examination without abnormal findings: Secondary | ICD-10-CM | POA: Diagnosis not present

## 2022-08-16 DIAGNOSIS — E1165 Type 2 diabetes mellitus with hyperglycemia: Secondary | ICD-10-CM | POA: Diagnosis not present

## 2022-08-16 DIAGNOSIS — J302 Other seasonal allergic rhinitis: Secondary | ICD-10-CM | POA: Diagnosis not present

## 2022-08-16 DIAGNOSIS — Z6839 Body mass index (BMI) 39.0-39.9, adult: Secondary | ICD-10-CM | POA: Diagnosis not present

## 2022-08-16 DIAGNOSIS — Z7189 Other specified counseling: Secondary | ICD-10-CM | POA: Diagnosis not present

## 2022-08-16 DIAGNOSIS — I10 Essential (primary) hypertension: Secondary | ICD-10-CM | POA: Diagnosis not present

## 2022-08-16 DIAGNOSIS — E668 Other obesity: Secondary | ICD-10-CM | POA: Diagnosis not present

## 2022-08-17 LAB — LIPID PANEL
Cholesterol: 117 mg/dL (ref <200)
HDL: 36 mg/dL — ABNORMAL LOW (ref >=50)
LDL Cholesterol (Calc): 62 mg/dL (calc)
Non-HDL Cholesterol (Calc): 81 mg/dL (calc) (ref <130)
Total CHOL/HDL Ratio: 3.3 (calc) (ref <5.0)
Triglycerides: 107 mg/dL (ref <150)

## 2022-08-17 LAB — COMPLETE METABOLIC PANEL WITH GFR
AG Ratio: 1.3 (calc) (ref 1.0–2.5)
ALT: 27 U/L (ref 6–29)
AST: 15 U/L (ref 10–30)
Albumin: 3.9 g/dL (ref 3.6–5.1)
Alkaline phosphatase (APISO): 62 U/L (ref 31–125)
BUN: 9 mg/dL (ref 7–25)
CO2: 26 mmol/L (ref 20–32)
Calcium: 9.3 mg/dL (ref 8.6–10.2)
Chloride: 99 mmol/L (ref 98–110)
Creat: 0.64 mg/dL (ref 0.50–0.97)
Globulin: 3 g/dL (calc) (ref 1.9–3.7)
Glucose, Bld: 173 mg/dL — ABNORMAL HIGH (ref 65–99)
Potassium: 4.3 mmol/L (ref 3.5–5.3)
Sodium: 135 mmol/L (ref 135–146)
Total Bilirubin: 0.4 mg/dL (ref 0.2–1.2)
Total Protein: 6.9 g/dL (ref 6.1–8.1)
eGFR: 119 mL/min/{1.73_m2} (ref >=60)

## 2022-08-17 LAB — VITAMIN D 25 HYDROXY (VIT D DEFICIENCY, FRACTURES): Vit D, 25-Hydroxy: 30 ng/mL (ref 30–100)

## 2022-08-17 LAB — CBC
HCT: 39.5 % (ref 35.0–45.0)
Hemoglobin: 13 g/dL (ref 11.7–15.5)
MCH: 26.5 pg — ABNORMAL LOW (ref 27.0–33.0)
MCHC: 32.9 g/dL (ref 32.0–36.0)
MCV: 80.4 fL (ref 80.0–100.0)
MPV: 11.1 fL (ref 7.5–12.5)
Platelets: 332 10*3/uL (ref 140–400)
RBC: 4.91 10*6/uL (ref 3.80–5.10)
RDW: 12.6 % (ref 11.0–15.0)
WBC: 7.6 10*3/uL (ref 3.8–10.8)

## 2022-08-17 LAB — T4, FREE: Free T4: 1.2 ng/dL (ref 0.8–1.8)

## 2022-08-17 LAB — T3 UPTAKE: T3 Uptake: 30 % (ref 22–35)

## 2022-08-17 LAB — T3, FREE: T3, Free: 3.2 pg/mL (ref 2.3–4.2)

## 2022-08-17 LAB — TSH: TSH: 0.42 mIU/L

## 2022-10-29 ENCOUNTER — Other Ambulatory Visit: Payer: Self-pay | Admitting: Obstetrics and Gynecology

## 2022-10-29 DIAGNOSIS — B3731 Acute candidiasis of vulva and vagina: Secondary | ICD-10-CM

## 2022-11-09 DIAGNOSIS — E1165 Type 2 diabetes mellitus with hyperglycemia: Secondary | ICD-10-CM | POA: Diagnosis not present

## 2023-02-06 NOTE — L&D Delivery Note (Signed)
 OB/GYN Faculty Practice Delivery Note  Joanne Waller is a 38 y.o. [id number] s/p SVD at [redacted]w[redacted]d. She was admitted for IOL for T2DM.   ROM: 7h 67m with clear fluid GBS Status: Positive/-- (10/16 1125) adequately treated Maximum Maternal Temperature:    Labor Progress: Initial SVE: 1.5/thick/-3. She then progressed to complete.   Delivery Date/Time: 12/02/23 at 1927 Delivery: Called to room and patient was complete and pushing. Head delivered LOA. No nuchal cord present. Shoulder and body delivered in usual fashion. Infant with spontaneous cry, placed on mother's abdomen, dried and stimulated. Cord clamped x 2 after 1-minute delay, and cut by FOB.  Placenta delivered spontaneously with gentle cord traction. Fundus firm with massage and Pitocin . Labia, perineum, vagina, and cervix inspected inspected with 1st degree perineal laceration noted.  Baby Weight: pending  Placenta: Sent to L&D Complications: None Lacerations: 1st degree perineal repaired with 3-0 vicryl EBL: 57 mL Analgesia: Epidural   Infant:  APGAR (1 MIN): 8  APGAR (5 MINS): 9

## 2023-02-11 DIAGNOSIS — F319 Bipolar disorder, unspecified: Secondary | ICD-10-CM | POA: Diagnosis not present

## 2023-02-11 DIAGNOSIS — I10 Essential (primary) hypertension: Secondary | ICD-10-CM | POA: Diagnosis not present

## 2023-02-11 DIAGNOSIS — E1165 Type 2 diabetes mellitus with hyperglycemia: Secondary | ICD-10-CM | POA: Diagnosis not present

## 2023-02-11 DIAGNOSIS — Z6839 Body mass index (BMI) 39.0-39.9, adult: Secondary | ICD-10-CM | POA: Diagnosis not present

## 2023-04-09 DIAGNOSIS — E1165 Type 2 diabetes mellitus with hyperglycemia: Secondary | ICD-10-CM | POA: Diagnosis not present

## 2023-04-09 DIAGNOSIS — I10 Essential (primary) hypertension: Secondary | ICD-10-CM | POA: Diagnosis not present

## 2023-04-09 DIAGNOSIS — J302 Other seasonal allergic rhinitis: Secondary | ICD-10-CM | POA: Diagnosis not present

## 2023-04-09 DIAGNOSIS — F329 Major depressive disorder, single episode, unspecified: Secondary | ICD-10-CM | POA: Diagnosis not present

## 2023-04-15 ENCOUNTER — Ambulatory Visit

## 2023-04-15 DIAGNOSIS — Z3201 Encounter for pregnancy test, result positive: Secondary | ICD-10-CM | POA: Diagnosis not present

## 2023-04-15 DIAGNOSIS — Z3200 Encounter for pregnancy test, result unknown: Secondary | ICD-10-CM

## 2023-04-15 DIAGNOSIS — O3680X0 Pregnancy with inconclusive fetal viability, not applicable or unspecified: Secondary | ICD-10-CM

## 2023-04-15 LAB — POCT PREGNANCY, URINE: Preg Test, Ur: POSITIVE — AB

## 2023-04-15 NOTE — Progress Notes (Signed)
 Possible Pregnancy  Here today for pregnancy confirmation. UPT in office today is positive. Pt reports first positive home UPT on 04/13/23. Was wanting 1 more pregnancy, but this is sooner than expected. Reviewed dating with patient:   LMP: 03/11/23 EDD: 12/16/23 5w 0d today  Reviewed medications and allergies with patient. Taking Metformin and long acting insulin for diabetes. Ozempic last taken 04/09/23, reviewed this is not recommended during pregnancy, patient does not plan to receive future injections while pregnant. Taking nifedipine for chronic hypertension. Viability [place] scheduled for 04/29/23. Front office to call patient to schedule new OB visits with MD.   Marjo Bicker, RN 04/15/2023  12:29 PM

## 2023-04-17 ENCOUNTER — Telehealth: Payer: Self-pay | Admitting: Family Medicine

## 2023-04-17 MED ORDER — PREPLUS 27-1 MG PO TABS: 1.0000 | ORAL_TABLET | Freq: Every day | ORAL | 8 refills | Status: DC

## 2023-04-17 NOTE — Telephone Encounter (Signed)
 Pt notified that her prescription was sent.  Pt stated thank you.  Leonette Nutting

## 2023-04-17 NOTE — Addendum Note (Signed)
 Addended by: Faythe Casa on: 04/17/2023 02:58 PM   Modules accepted: Orders

## 2023-04-17 NOTE — Telephone Encounter (Signed)
 Patient is requesting for some prenatal vitamins to be sent to her pharmacy.

## 2023-04-24 ENCOUNTER — Other Ambulatory Visit: Payer: Self-pay

## 2023-04-24 ENCOUNTER — Ambulatory Visit

## 2023-04-24 DIAGNOSIS — Z3A00 Weeks of gestation of pregnancy not specified: Secondary | ICD-10-CM | POA: Diagnosis not present

## 2023-04-24 DIAGNOSIS — O3680X0 Pregnancy with inconclusive fetal viability, not applicable or unspecified: Secondary | ICD-10-CM

## 2023-04-24 NOTE — Progress Notes (Signed)
 Here for [place] and found to have gestational sac and yolk sac but no fetal pole. Repeat US ordered. I informed patient of result and plan to repeat US. [place] scheduled with her dates/ availability. She denies any bleeding. Bleeding precautions given. She voices understanding. Nancy Fetter

## 2023-04-30 ENCOUNTER — Other Ambulatory Visit

## 2023-04-30 ENCOUNTER — Encounter (HOSPITAL_COMMUNITY): Payer: Self-pay | Admitting: Obstetrics & Gynecology

## 2023-04-30 ENCOUNTER — Inpatient Hospital Stay (HOSPITAL_COMMUNITY)
Admission: AD | Admit: 2023-04-30 | Discharge: 2023-04-30 | Disposition: A | Discharge status: Home / Self Care | Attending: Obstetrics & Gynecology | Admitting: Obstetrics & Gynecology

## 2023-04-30 DIAGNOSIS — Z880 Allergy status to penicillin: Secondary | ICD-10-CM | POA: Insufficient documentation

## 2023-04-30 DIAGNOSIS — B9689 Other specified bacterial agents as the cause of diseases classified elsewhere: Secondary | ICD-10-CM | POA: Diagnosis not present

## 2023-04-30 DIAGNOSIS — Z3A01 Less than 8 weeks gestation of pregnancy: Secondary | ICD-10-CM | POA: Diagnosis not present

## 2023-04-30 DIAGNOSIS — R1030 Lower abdominal pain, unspecified: Secondary | ICD-10-CM | POA: Diagnosis not present

## 2023-04-30 DIAGNOSIS — O2341 Unspecified infection of urinary tract in pregnancy, first trimester: Secondary | ICD-10-CM | POA: Insufficient documentation

## 2023-04-30 DIAGNOSIS — O26891 Other specified pregnancy related conditions, first trimester: Secondary | ICD-10-CM | POA: Insufficient documentation

## 2023-04-30 LAB — URINALYSIS, ROUTINE W REFLEX MICROSCOPIC
Bilirubin Urine: NEGATIVE
Glucose, UA: NEGATIVE mg/dL
Hgb urine dipstick: NEGATIVE
Ketones, ur: NEGATIVE mg/dL
Nitrite: NEGATIVE
Protein, ur: NEGATIVE mg/dL
Specific Gravity, Urine: 1.029 (ref 1.005–1.030)
pH: 6 (ref 5.0–8.0)

## 2023-04-30 LAB — WET PREP, GENITAL
Clue Cells Wet Prep HPF POC: NONE SEEN
Sperm: NONE SEEN
Trich, Wet Prep: NONE SEEN
WBC, Wet Prep HPF POC: >=10 — AB (ref <10)
Yeast Wet Prep HPF POC: NONE SEEN

## 2023-04-30 MED ORDER — NITROFURANTOIN MONOHYD MACRO 100 MG PO CAPS
100.0000 mg | ORAL_CAPSULE | Freq: Two times a day (BID) | ORAL | 0 refills | Status: DC
Start: 2023-04-30 — End: 2023-07-08

## 2023-04-30 NOTE — Discharge Instructions (Signed)
Return to MAU: If you have heavy bleeding that soaks through more that 2 pads per hour for an hour or more If you bleed so much that you feel like you might pass out or you do pass out If you have significant abdominal pain that is not improved with Tylenol 1000 mg every 8 hours as needed for pain If you develop a fever > 100.5

## 2023-04-30 NOTE — MAU Note (Signed)
 Joanne Waller is a 38 y.o. at [redacted]w[redacted]d here in MAU reporting: yesterday was feeling a lot of pressure and spotting (pinkish).  Today is cramping and still feeling pressure. No spotting today.   Onset of complaint: yesterday Pain score: moderate Vitals:   04/30/23 1405  BP: 139/87  Pulse: (!) 110  Resp: 17  Temp: 98.3 F (36.8 C)  SpO2: 98%      Lab orders placed from triage:  urine vag swabs

## 2023-04-30 NOTE — MAU Provider Note (Signed)
 History     CSN: [id number]  Arrival date and time: 04/30/23 1342   Event Date/Time   First Provider Initiated Contact with Patient 04/30/23 1507      Chief Complaint  Patient presents with   Abdominal Pain   HPI [name] is a 38 y.o. year old [id number] female at [redacted]w[redacted]d weeks gestation who presents to MAU reporting feeling " a lot" of lower abdominal pressure and pink spotting yesterday. She reports she continues to have cramping and pressure in her lower abdomen today, but no spotting. She denies any pain or burning with urination. She had an U/S on 04/24/2023 that showed an IUGS with (+) YS, no FP detected. She is scheduled to have repeat U/S on 05/07/2023. She plans to receive [facility] with [facility]; next appt is 05/21/2023 & 05/29/2023.    OB History     Gravida  5   Para  2   Term  2   Preterm      AB  2   Living  2      SAB  2   IAB      Ectopic      Multiple  0   Live Births  2        Obstetric Comments  2023 Magnesium for BP         Past Medical History:  Diagnosis Date   Closed avulsion fracture of left ankle 08/14/2019   Depression    Diabetes mellitus without complication (HCC)    Hypertension    Pyelonephritis    Renal disorder    pyelonephritis   Seizures (HCC)    none since age 58    Past Surgical History:  Procedure Laterality Date   CHOLECYSTECTOMY     DILATION AND CURETTAGE OF UTERUS      Family History  Problem Relation Age of Onset   Kidney disease Mother        kidney stones   Hypertension Mother    Sickle cell anemia Father    Hyperlipidemia Maternal Grandmother     Social History   Tobacco Use   Smoking status: Former    Current packs/day: 0.00    Average packs/day: 0.5 packs/day for 7.0 years (3.5 ttl pk-yrs)    Types: Cigarettes    Start date: 06/23/2013    Quit date: 06/23/2020    Years since quitting: 2.8   Smokeless tobacco: Never  Vaping Use   Vaping status: Never Used  Substance Use Topics   Alcohol use:  Not Currently    Comment: not while preg   Drug use: No    Allergies:  Allergies  Allergen Reactions   Amoxicillin Other (See Comments)    Pt stated, "I get a severe blinding headache"   Penicillins Other (See Comments)    Sharp pain in temple    Latex Itching, Swelling and Rash    Medications Prior to Admission  Medication Sig Dispense Refill Last Dose/Taking   insulin glargine, 1 Unit Dial, (TOUJEO SOLOSTAR) 300 UNIT/ML Solostar Pen Inject 30 Units into the skin at bedtime.   04/29/2023 Evening   metFORMIN (GLUCOPHAGE) 500 MG tablet Take 2 tablets (1,000 mg total) by mouth 2 (two) times daily with a meal. 120 tablet 9 04/30/2023 Morning   NIFEdipine (PROCARDIA-XL/NIFEDICAL-XL) 30 MG 24 hr tablet TAKE 1 TABLET BY MOUTH DAILY 30 tablet 5 04/30/2023 Morning   ACCU-CHEK GUIDE test strip  (Patient not taking: Reported on 04/15/2023)      Accu-Chek  Softclix Lancets lancets daily. (Patient not taking: Reported on 04/15/2023)      acetaminophen (TYLENOL) 325 MG tablet Take 2 tablets (650 mg total) by mouth every 4 (four) hours as needed for moderate pain (for pain scale < 4).   More than a month   B-D ULTRAFINE III SHORT PEN 31G X 8 MM MISC daily.      Blood Glucose Monitoring Suppl (ACCU-CHEK GUIDE) w/Device KIT  (Patient not taking: Reported on 04/15/2023)      Continuous Glucose Sensor (DEXCOM G7 SENSOR) MISC 1 each by Does not apply route.      Prenatal Vit-Fe Fumarate-FA (PREPLUS) 27-1 MG TABS Take 1 tablet by mouth daily. 30 tablet 8     Review of Systems  Constitutional: Negative.   HENT: Negative.    Eyes: Negative.   Respiratory: Negative.    Cardiovascular: Negative.   Gastrointestinal: Negative.   Endocrine: Negative.   Genitourinary:  Positive for pelvic pain (cramping and pressure). Negative for vaginal bleeding (pink spotting yesterday; none today).  Musculoskeletal: Negative.   Skin: Negative.   Allergic/Immunologic: Negative.   Neurological: Negative.   Hematological:  Negative.   Psychiatric/Behavioral: Negative.     Physical Exam   Blood pressure 139/87, pulse (!) 110, temperature 98.3 F (36.8 C), temperature source Oral, resp. rate 17, height 5\' 10"  (1.778 m), weight 122.5 kg, last menstrual period 03/11/2023, SpO2 98%, unknown if currently breastfeeding.  Physical Exam Vitals and nursing note reviewed.  Constitutional:      Appearance: Normal appearance. She is obese.  Cardiovascular:     Rate and Rhythm: Tachycardia present.  Pulmonary:     Effort: Pulmonary effort is normal.  Genitourinary:    Comments: Swabs collected by patient using blind swab technique  Musculoskeletal:        General: Normal range of motion.  Skin:    General: Skin is warm and dry.  Neurological:     Mental Status: She is alert and oriented to person, place, and time.  Psychiatric:        Mood and Affect: Mood normal.        Behavior: Behavior normal.        Thought Content: Thought content normal.        Judgment: Judgment normal.    MAU Course  Procedures  MDM CCUA UCx -- Results pending  Wet Prep GC/CT -- Results pending   Results for orders placed or performed during the hospital encounter of 04/30/23 (from the past 24 hours)  Urinalysis, Routine w reflex microscopic -Urine, Clean Catch     Status: Abnormal   Collection Time: 04/30/23  2:30 PM  Result Value Ref Range   Color, Urine YELLOW YELLOW   APPearance HAZY (A) CLEAR   Specific Gravity, Urine 1.029 1.005 - 1.030   pH 6.0 5.0 - 8.0   Glucose, UA NEGATIVE NEGATIVE mg/dL   Hgb urine dipstick NEGATIVE NEGATIVE   Bilirubin Urine NEGATIVE NEGATIVE   Ketones, ur NEGATIVE NEGATIVE mg/dL   Protein, ur NEGATIVE NEGATIVE mg/dL   Nitrite NEGATIVE NEGATIVE   Leukocytes,Ua MODERATE (A) NEGATIVE   RBC / HPF 0-5 0 - 5 RBC/hpf   WBC, UA 0-5 0 - 5 WBC/hpf   Bacteria, UA RARE (A) NONE SEEN   Squamous Epithelial / HPF 6-10 0 - 5 /HPF   Mucus PRESENT    Ca Oxalate Crys, UA PRESENT   Wet prep, genital      Status: Abnormal   Collection Time: 04/30/23  2:30  PM  Result Value Ref Range   Yeast Wet Prep HPF POC NONE SEEN NONE SEEN   Trich, Wet Prep NONE SEEN NONE SEEN   Clue Cells Wet Prep HPF POC NONE SEEN NONE SEEN   WBC, Wet Prep HPF POC >=10 (A) <10   Sperm NONE SEEN     Assessment and Plan  1. UTI (urinary tract infection) during pregnancy, first trimester (Primary) - Information provided on UTI and pregnancy - Prescription for: Macrobid 100 mg po x 7 days (d/t PCN allergy)  - Continue to drink plenty of fluids  2. [redacted] weeks gestation of pregnancy - Return to MAU: If you have heavier bleeding that soaks through more that 2 pads per hour for an hour or more If you bleed so much that you feel like you might pass out or you do pass out If you have significant abdominal pain that is not improved with Tylenol 1000 mg every 8 hours as needed for pain If you develop a fever > 100.5    - Discharge home - Keep scheduled appts with [facility] - Patient verbalized an understanding of the plan of care and agrees.   Raelyn Mora, CNM 04/30/2023, 3:07 PM

## 2023-05-01 LAB — GC/CHLAMYDIA PROBE AMP (~~LOC~~) NOT AT ARMC
Chlamydia: NEGATIVE
Comment: NEGATIVE
Comment: NORMAL
Neisseria Gonorrhea: NEGATIVE

## 2023-05-02 LAB — CULTURE, OB URINE: Culture: <10000 — AB

## 2023-05-03 ENCOUNTER — Inpatient Hospital Stay (HOSPITAL_COMMUNITY)
Admission: AD | Admit: 2023-05-03 | Discharge: 2023-05-03 | Disposition: A | Discharge status: Home / Self Care | Payer: Self-pay | Attending: Obstetrics and Gynecology | Admitting: Obstetrics and Gynecology

## 2023-05-03 ENCOUNTER — Other Ambulatory Visit: Payer: Self-pay

## 2023-05-03 DIAGNOSIS — N760 Acute vaginitis: Secondary | ICD-10-CM

## 2023-05-03 DIAGNOSIS — O09521 Supervision of elderly multigravida, first trimester: Secondary | ICD-10-CM | POA: Insufficient documentation

## 2023-05-03 DIAGNOSIS — O26851 Spotting complicating pregnancy, first trimester: Secondary | ICD-10-CM | POA: Diagnosis not present

## 2023-05-03 DIAGNOSIS — O23591 Infection of other part of genital tract in pregnancy, first trimester: Secondary | ICD-10-CM | POA: Insufficient documentation

## 2023-05-03 DIAGNOSIS — B9689 Other specified bacterial agents as the cause of diseases classified elsewhere: Secondary | ICD-10-CM | POA: Insufficient documentation

## 2023-05-03 LAB — WET PREP, GENITAL
Sperm: NONE SEEN
Trich, Wet Prep: NONE SEEN
WBC, Wet Prep HPF POC: <10 (ref <10)
Yeast Wet Prep HPF POC: NONE SEEN

## 2023-05-03 MED ORDER — METRONIDAZOLE 500 MG PO TABS: 500.0000 mg | ORAL_TABLET | Freq: Two times a day (BID) | ORAL | 0 refills | Status: AC

## 2023-05-03 MED ORDER — ONDANSETRON 4 MG PO TBDP: 4.0000 mg | ORAL_TABLET | Freq: Three times a day (TID) | ORAL | 0 refills | Status: DC | PRN

## 2023-05-03 NOTE — MAU Note (Signed)
 Joanne Waller is a 38 y.o. at [redacted]w[redacted]d here in MAU reporting: she had light VB last night and also has lower abdominal and lower back cramping.  States she's currently taking an antibiotic for a UTI that was diagnosed 2 days ago when seen in MAU  LMP: 03/11/2023 Onset of complaint: last night Pain score: 6 Vitals:   05/03/23 0933  BP: 123/76  Pulse: 87  Resp: 18  Temp: 98.2 F (36.8 C)  SpO2: 100%     FHT: NA  Lab orders placed from triage: None

## 2023-05-03 NOTE — MAU Provider Note (Signed)
 History     CSN: [id number]  Arrival date and time: 05/03/23 0865   Event Date/Time   First Provider Initiated Contact with Patient 05/03/2023  9:32 AM   Chief Complaint  Patient presents with   Cramping   Vaginal Bleeding    HPI  Joanne Waller is a 38 y.o. [id number] at [redacted]w[redacted]d who presents to the MAU for spotting and cramping. Was recently seen in MAU on 3/25 for similar presenting complaints of abdominal pressure and pink spotting. She was diagnosed with a UTI at the time and was prescribed a course of Macrobid. She states spotting and cramping got better initially, then returned yesterday. Cramping now suprapubic and her sides. Describes having pinkish discharge described as a flow. She denies any bright red bleeding or passage of clots.   Past Medical History:  Diagnosis Date   Closed avulsion fracture of left ankle 08/14/2019   Depression    Diabetes mellitus without complication (HCC)    Hypertension    Pyelonephritis    Renal disorder    pyelonephritis   Seizures (HCC)    none since age 53    Past Surgical History:  Procedure Laterality Date   CHOLECYSTECTOMY     DILATION AND CURETTAGE OF UTERUS      Family History  Problem Relation Age of Onset   Kidney disease Mother        kidney stones   Hypertension Mother    Sickle cell anemia Father    Hyperlipidemia Maternal Grandmother     Social History   Tobacco Use   Smoking status: Former    Current packs/day: 0.00    Average packs/day: 0.5 packs/day for 7.0 years (3.5 ttl pk-yrs)    Types: Cigarettes    Start date: 06/23/2013    Quit date: 06/23/2020    Years since quitting: 2.8   Smokeless tobacco: Never  Vaping Use   Vaping status: Never Used  Substance Use Topics   Alcohol use: Not Currently    Comment: not while preg   Drug use: No    Allergies:  Allergies  Allergen Reactions   Amoxicillin Other (See Comments)    Pt stated, "I get a severe blinding headache"   Penicillins Other (See Comments)     Sharp pain in temple    Latex Itching, Swelling and Rash    No medications prior to admission.    ROS reviewed and pertinent positives and negatives as documented in HPI.  Physical Exam   Blood pressure 123/76, pulse 87, temperature 98.2 F (36.8 C), temperature source Oral, resp. rate 18, height 5\' 10"  (1.778 m), weight 122.2 kg, last menstrual period 03/11/2023, SpO2 100%, unknown if currently breastfeeding.  Physical Exam Constitutional:      General: She is not in acute distress.    Appearance: Normal appearance. She is not ill-appearing.  HENT:     Head: Normocephalic and atraumatic.  Cardiovascular:     Rate and Rhythm: Normal rate.  Pulmonary:     Effort: Pulmonary effort is normal.     Breath sounds: Normal breath sounds.  Abdominal:     Palpations: Abdomen is soft.     Tenderness: There is no abdominal tenderness. There is no guarding.  Musculoskeletal:        General: Normal range of motion.  Skin:    General: Skin is warm and dry.     Findings: No rash.  Neurological:     General: No focal deficit present.  Mental Status: She is alert and oriented to person, place, and time.     MAU Course  Procedures  MDM 38 y.o. [id number] at [redacted]w[redacted]d presenting for increased vaginal spotting/passage of pinkish discharge and cramping in setting of recent UTI and current use of antibiotics. She has a confirmed IUGS with upcoming TVUS for viability scan and dating on 4/1. Recently seen for the same and was diagnosed w UTI. Her exam is benign. Her swabs showed clue cells, suspect sxs due to BV. Discussed diagnosis and return precautions with patient. Stable for d/c.  Assessment and Plan  BV (bacterial vaginosis) Rx for Flagyl sent   Sundra Aland, MD OB Fellow, Faculty Practice [facility], Center for [facility]  05/03/2023, 3:53 PM

## 2023-05-07 ENCOUNTER — Other Ambulatory Visit

## 2023-05-07 ENCOUNTER — Ambulatory Visit (INDEPENDENT_AMBULATORY_CARE_PROVIDER_SITE_OTHER)

## 2023-05-07 ENCOUNTER — Other Ambulatory Visit: Payer: Self-pay

## 2023-05-07 DIAGNOSIS — Z3491 Encounter for supervision of normal pregnancy, unspecified, first trimester: Secondary | ICD-10-CM | POA: Diagnosis not present

## 2023-05-07 DIAGNOSIS — Z3A01 Less than 8 weeks gestation of pregnancy: Secondary | ICD-10-CM

## 2023-05-07 DIAGNOSIS — O3680X0 Pregnancy with inconclusive fetal viability, not applicable or unspecified: Secondary | ICD-10-CM

## 2023-05-13 ENCOUNTER — Encounter: Payer: Self-pay | Admitting: Family Medicine

## 2023-05-21 ENCOUNTER — Telehealth

## 2023-05-21 DIAGNOSIS — O0991 Supervision of high risk pregnancy, unspecified, first trimester: Secondary | ICD-10-CM | POA: Diagnosis not present

## 2023-05-21 DIAGNOSIS — O0990 Supervision of high risk pregnancy, unspecified, unspecified trimester: Secondary | ICD-10-CM | POA: Insufficient documentation

## 2023-05-21 DIAGNOSIS — Z3A09 9 weeks gestation of pregnancy: Secondary | ICD-10-CM

## 2023-05-21 DIAGNOSIS — Z6839 Body mass index (BMI) 39.0-39.9, adult: Secondary | ICD-10-CM | POA: Insufficient documentation

## 2023-05-21 NOTE — Patient Instructions (Signed)

## 2023-05-21 NOTE — Progress Notes (Signed)
 New OB Intake  I connected with Joanne Waller  on 05/21/23 at  1:15 PM EDT by MyChart Video Visit and verified that I am speaking with the correct person using two identifiers. Nurse is located at [facility] and pt is located at home.  I discussed the limitations, risks, security and privacy concerns of performing an evaluation and management service by telephone and the availability of in person appointments. I also discussed with the patient that there may be a patient responsible charge related to this service. The patient expressed understanding and agreed to proceed.  I explained I am completing New OB Intake today. We discussed EDD of 12/22/2023, by Ultrasound. Pt is [id number]. I reviewed her allergies, medications and Medical/Surgical/OB history.    Patient Active Problem List   Diagnosis Date Noted   AMA (advanced maternal age) multigravida 35+ 07/26/2022   Obesity affecting pregnancy 07/26/2022   Supervision of other normal pregnancy, antepartum 05/07/2022   Alpha thalassemia silent carrier 10/14/2020   Pre-existing type 2 diabetes affecting pregnancy, antepartum 08/15/2020   Pre-existing essential hypertension during pregnancy, antepartum 08/15/2020    Concerns addressed today  Delivery Plans Plans to deliver at [facility]. Discussed the nature of our practice with multiple providers including residents and students. Due to the size of the practice, the delivering provider may not be the same as those providing prenatal care.   Patient is not interested in water birth. Offered upcoming OB visit with CNM to discuss further.  MyChart/Babyscripts MyChart access verified. I explained pt will have some visits in office and some virtually. Babyscripts instructions given and order placed. Patient verifies receipt of registration text/e-mail. Account successfully created and app downloaded. If patient is a candidate for Optimized scheduling, add to sticky note.   Blood Pressure Cuff/Weight  Scale Patient has private insurance; instructed to purchase blood pressure cuff and bring to first prenatal appt. Explained after first prenatal appt pt will check weekly and document in Babyscripts.  Anatomy US  Explained first scheduled US  will be around 19 weeks. Anatomy US  scheduled for 08/06/2023 at 10:00am.  Is patient a CenteringPregnancy candidate?  Declined Declined due to Declined to say    Is patient a Mom+Baby Combined Care candidate?  Not a candidate   If accepted, confirm patient does not intend to move from the area for at least 12 months, then notify Mom+Baby staff  Interested in [place]? If yes, send referral and doula dot phrase.   Is patient a candidate for Babyscripts Optimization? No, due to HX   First visit review I reviewed new OB appt with patient. Explained pt will be seen by Dr. Racheal Buddle at first visit. Discussed [name] genetic screening with patient. Panorama and Horizon.. Routine prenatal labs is needed at NOB visit.  Last Pap Diagnosis  Date Value Ref Range Status  05/13/2020   Final   - Negative for intraepithelial lesion or malignancy (NILM)    Joanne Waller, CMA 05/21/2023  1:17 PM

## 2023-05-29 ENCOUNTER — Encounter: Payer: Self-pay | Admitting: Obstetrics and Gynecology

## 2023-05-29 ENCOUNTER — Other Ambulatory Visit: Payer: Self-pay

## 2023-05-29 ENCOUNTER — Ambulatory Visit (INDEPENDENT_AMBULATORY_CARE_PROVIDER_SITE_OTHER): Payer: Self-pay | Admitting: Obstetrics and Gynecology

## 2023-05-29 ENCOUNTER — Other Ambulatory Visit (HOSPITAL_COMMUNITY)
Admission: RE | Admit: 2023-05-29 | Discharge: 2023-05-29 | Disposition: A | Discharge status: Home / Self Care | Source: Ambulatory Visit | Attending: Obstetrics and Gynecology | Admitting: Obstetrics and Gynecology

## 2023-05-29 VITALS — BP 135/90 | HR 111 | Wt 273.0 lb

## 2023-05-29 DIAGNOSIS — O24111 Pre-existing diabetes mellitus, type 2, in pregnancy, first trimester: Secondary | ICD-10-CM

## 2023-05-29 DIAGNOSIS — O99211 Obesity complicating pregnancy, first trimester: Secondary | ICD-10-CM

## 2023-05-29 DIAGNOSIS — O24119 Pre-existing diabetes mellitus, type 2, in pregnancy, unspecified trimester: Secondary | ICD-10-CM

## 2023-05-29 DIAGNOSIS — D563 Thalassemia minor: Secondary | ICD-10-CM

## 2023-05-29 DIAGNOSIS — O0991 Supervision of high risk pregnancy, unspecified, first trimester: Secondary | ICD-10-CM | POA: Diagnosis not present

## 2023-05-29 DIAGNOSIS — O10019 Pre-existing essential hypertension complicating pregnancy, unspecified trimester: Secondary | ICD-10-CM

## 2023-05-29 DIAGNOSIS — O0990 Supervision of high risk pregnancy, unspecified, unspecified trimester: Secondary | ICD-10-CM | POA: Insufficient documentation

## 2023-05-29 DIAGNOSIS — O99212 Obesity complicating pregnancy, second trimester: Secondary | ICD-10-CM

## 2023-05-29 DIAGNOSIS — Z3A10 10 weeks gestation of pregnancy: Secondary | ICD-10-CM

## 2023-05-29 DIAGNOSIS — O09521 Supervision of elderly multigravida, first trimester: Secondary | ICD-10-CM

## 2023-05-29 DIAGNOSIS — O10011 Pre-existing essential hypertension complicating pregnancy, first trimester: Secondary | ICD-10-CM

## 2023-05-29 MED ORDER — DEXCOM G7 SENSOR MISC: 10 refills | Status: DC

## 2023-05-29 MED ORDER — NIFEDIPINE ER OSMOTIC RELEASE 30 MG PO TB24: 30.0000 mg | ORAL_TABLET | Freq: Every day | ORAL | 2 refills | Status: DC

## 2023-05-29 MED ORDER — ASPIRIN 81 MG PO CHEW: 162.0000 mg | CHEWABLE_TABLET | Freq: Every day | ORAL | 8 refills | Status: DC

## 2023-05-29 NOTE — Progress Notes (Signed)
 INITIAL PRENATAL VISIT NOTE  Subjective:  Joanne Waller is a 38 y.o. [id number] at [redacted]w[redacted]d by early ultrasound being seen today for her initial prenatal visit. She has an obstetric history significant for SVD x 2. She has a medical history significant for hypertension and type 2 diabetes.  Patient reports no complaints.   . Vag. Bleeding: None.   . Denies leaking of fluid.    Past Medical History:  Diagnosis Date   Closed avulsion fracture of left ankle 08/14/2019   Depression    Diabetes mellitus without complication (HCC)    Hypertension    Pyelonephritis    Renal disorder    pyelonephritis   Seizures (HCC)    none since age 13    Past Surgical History:  Procedure Laterality Date   CHOLECYSTECTOMY     DILATION AND CURETTAGE OF UTERUS      OB History  Gravida Para Term Preterm AB Living  5 2 2  2 2   SAB IAB Ectopic Multiple Live Births  1 1  0 2    # Outcome Date GA Lbr Len/2nd Weight Sex Type Anes PTL Lv  5 Current           4 IAB 2024     SAB     3 Term 02/23/21 [redacted]w[redacted]d 06:45 / 00:08 7 lb 12.5 oz (3.53 kg) M Vag-Spont EPI  LIV     Birth Comments: Facial bruising   2 SAB 10/06/08        DEC  1 Term 03/17/04 [redacted]w[redacted]d  7 lb 5.5 oz (3.33 kg) M Vag-Spont   LIV    Obstetric Comments  2023 Magnesium  for BP    Social History   Socioeconomic History   Marital status: Single    Spouse name: Not on file   Number of children: Not on file   Years of education: Not on file   Highest education level: Not on file  Occupational History   Not on file  Tobacco Use   Smoking status: Former    Current packs/day: 0.00    Average packs/day: 0.5 packs/day for 7.0 years (3.5 ttl pk-yrs)    Types: Cigarettes    Start date: 06/23/2013    Quit date: 06/23/2020    Years since quitting: 2.9   Smokeless tobacco: Never  Vaping Use   Vaping status: Never Used  Substance and Sexual Activity   Alcohol use: Not Currently    Comment: not while preg   Drug use: No   Sexual activity: Yes     Partners: Male    Birth control/protection: None  Other Topics Concern   Not on file  Social History Narrative   Not on file   Social Drivers of Health   Financial Resource Strain: Not on file  Food Insecurity: No Food Insecurity (11/28/2020)   Hunger Vital Sign    Worried About Running Out of Food in the Last Year: Never true    Ran Out of Food in the Last Year: Never true  Transportation Needs: No Transportation Needs (11/28/2020)   PRAPARE - Administrator, Civil Service (Medical): No    Lack of Transportation (Non-Medical): No  Physical Activity: Not on file  Stress: Not on file  Social Connections: Unknown (04/06/2022)   Received from [facility]   Social Network    Social Network: Not on file    Family History  Problem Relation Age of Onset   Kidney disease Mother  kidney stones   Hypertension Mother    Sickle cell anemia Father    Hyperlipidemia Maternal Grandmother      Current Outpatient Medications:    ACCU-CHEK GUIDE test strip, , Disp: , Rfl:    Accu-Chek Softclix Lancets lancets, daily., Disp: , Rfl:    acetaminophen  (TYLENOL ) 325 MG tablet, Take 2 tablets (650 mg total) by mouth every 4 (four) hours as needed for moderate pain (for pain scale < 4)., Disp: , Rfl:    aspirin  81 MG chewable tablet, Chew 2 tablets (162 mg total) by mouth daily., Disp: 60 tablet, Rfl: 8   B-D ULTRAFINE III SHORT PEN 31G X 8 MM MISC, daily., Disp: , Rfl:    Blood Glucose Monitoring Suppl (ACCU-CHEK GUIDE) w/Device KIT, , Disp: , Rfl:    insulin  glargine, 1 Unit Dial, (TOUJEO  SOLOSTAR) 300 UNIT/ML Solostar Pen, Inject 30 Units into the skin at bedtime., Disp: , Rfl:    metFORMIN  (GLUCOPHAGE ) 500 MG tablet, Take 2 tablets (1,000 mg total) by mouth 2 (two) times daily with a meal., Disp: 120 tablet, Rfl: 9   NIFEdipine  (PROCARDIA -XL/NIFEDICAL-XL) 30 MG 24 hr tablet, Take 1 tablet (30 mg total) by mouth daily. Can increase to twice a day as needed for  symptomatic contractions, Disp: 30 tablet, Rfl: 2   ondansetron  (ZOFRAN -ODT) 4 MG disintegrating tablet, Take 1 tablet (4 mg total) by mouth every 8 (eight) hours as needed for nausea or vomiting., Disp: 15 tablet, Rfl: 0   Continuous Glucose Sensor (DEXCOM G7 SENSOR) MISC, USE AS DIRECTED, Disp: 3 each, Rfl: 10   nitrofurantoin , macrocrystal-monohydrate, (MACROBID ) 100 MG capsule, Take 1 capsule (100 mg total) by mouth 2 (two) times daily. (Patient not taking: Reported on 05/29/2023), Disp: 14 capsule, Rfl: 0   Prenatal Vit-Fe Fumarate-FA (PREPLUS) 27-1 MG TABS, Take 1 tablet by mouth daily. (Patient not taking: Reported on 05/29/2023), Disp: 30 tablet, Rfl: 8  Allergies  Allergen Reactions   Amoxicillin Other (See Comments)    Pt stated, "I get a severe blinding headache"   Penicillins Other (See Comments)    Sharp pain in temple    Latex Itching, Swelling and Rash    Review of Systems: Negative except for what is mentioned in HPI.  Objective:   Vitals:   05/29/23 1431  BP: (!) 135/90  Pulse: (!) 111  Weight: 273 lb (123.8 kg)    Fetal Status: Fetal Heart Rate (bpm): 164         Physical Exam: BP (!) 135/90   Pulse (!) 111   Wt 273 lb (123.8 kg)   LMP 03/11/2023 (Exact Date)   BMI 39.17 kg/m  CONSTITUTIONAL: Well-developed, obese, well-nourished female in no acute distress.  NEUROLOGIC: Alert and oriented to person, place, and time. Normal reflexes, muscle tone coordination. No cranial nerve deficit noted. PSYCHIATRIC: Normal mood and affect. Normal behavior. Normal judgment and thought content. SKIN: Skin is warm and dry. No rash noted. Not diaphoretic. No erythema. No pallor. HENT:  Normocephalic, atraumatic, External right and left ear normal. Oropharynx is clear and moist EYES: Conjunctivae and EOM are normal.  NECK: Normal range of motion, supple, no masses CARDIOVASCULAR: Normal heart rate noted, regular rhythm RESPIRATORY: Effort and breath sounds normal, no problems  with respiration noted BREASTS: deferred ABDOMEN: Soft, nontender, nondistended, gravid. GU: deferred MUSCULOSKELETAL: Normal range of motion. EXT:  No edema and no tenderness. 2+ distal pulses.   Assessment and Plan:  Pregnancy: [id number] at [redacted]w[redacted]d by early ultrasound  1. Supervision  of high risk pregnancy, antepartum (Primary) Continue routine prenatal care  - Culture, OB Urine - GC/Chlamydia probe amp ([place])not at [facility] - CBC/D/Plt+RPR+Rh+ABO+RubIgG... - Hemoglobin A1c - PANORAMA PRENATAL TEST - Ambulatory referral to Nutrition and Diabetic Education  2. Type 2 diabetes mellitus affecting pregnancy in first trimester, antepartum Pt is comfortable with dexcom 7 and usage of metformin  and insulin  Pt will likely need fetal echo at a later date Pt notes she has just seen her ophthalmologist about a month ago.  Start baby ASA at 13-14 weeks Baseline labs drawn today - Ambulatory referral to Nutrition and Diabetic Education - Continuous Glucose Sensor (DEXCOM G7 SENSOR) MISC; USE AS DIRECTED  Dispense: 3 each; Refill: 10  3. Pre-existing type 2 diabetes affecting pregnancy, antepartum As above  - aspirin  81 MG chewable tablet; Chew 2 tablets (162 mg total) by mouth daily.  Dispense: 60 tablet; Refill: 8 - Comprehensive metabolic panel with GFR - Protein / creatinine ratio, urine  4. Obesity affecting pregnancy in second trimester, unspecified obesity type Advised 15-20 pound weight gain this pregnancy  5. Multigravida of advanced maternal age in first trimester   6. Alpha thalassemia silent carrier   7. Pre-existing essential hypertension during pregnancy, antepartum Restarted procardia  as her PCP asked her to  - NIFEdipine  (PROCARDIA -XL/NIFEDICAL-XL) 30 MG 24 hr tablet; Take 1 tablet (30 mg total) by mouth daily. Can increase to twice a day as needed for symptomatic contractions  Dispense: 30 tablet; Refill: 2 - aspirin  81 MG chewable tablet; Chew 2 tablets (162 mg  total) by mouth daily.  Dispense: 60 tablet; Refill: 8 - Comprehensive metabolic panel with GFR - Protein / creatinine ratio, urine   Preterm labor symptoms and general obstetric precautions including but not limited to vaginal bleeding, contractions, leaking of fluid and fetal movement were reviewed in detail with the patient.  Please refer to After Visit Summary for other counseling recommendations.   Return in about 3 weeks (around 06/19/2023) for [facility], in person.  Abigail Abler 05/29/2023 3:14 PM

## 2023-05-30 LAB — GC/CHLAMYDIA PROBE AMP (~~LOC~~) NOT AT ARMC
Chlamydia: NEGATIVE
Comment: NEGATIVE
Comment: NORMAL
Neisseria Gonorrhea: NEGATIVE

## 2023-05-31 LAB — CBC/D/PLT+RPR+RH+ABO+RUBIGG...
Antibody Screen: NEGATIVE
Basophils Absolute: 0 10*3/uL (ref 0.0–0.2)
Basos: 1 %
EOS (ABSOLUTE): 0.1 10*3/uL (ref 0.0–0.4)
Eos: 1 %
HCV Ab: NONREACTIVE
HIV Screen 4th Generation wRfx: NONREACTIVE
Hematocrit: 37.7 % (ref 34.0–46.6)
Hemoglobin: 12.4 g/dL (ref 11.1–15.9)
Hepatitis B Surface Ag: NEGATIVE
Immature Grans (Abs): 0 10*3/uL (ref 0.0–0.1)
Immature Granulocytes: 0 %
Lymphocytes Absolute: 2.4 10*3/uL (ref 0.7–3.1)
Lymphs: 29 %
MCH: 26.6 pg (ref 26.6–33.0)
MCHC: 32.9 g/dL (ref 31.5–35.7)
MCV: 81 fL (ref 79–97)
Monocytes Absolute: 0.6 10*3/uL (ref 0.1–0.9)
Monocytes: 8 %
Neutrophils Absolute: 5.1 10*3/uL (ref 1.4–7.0)
Neutrophils: 61 %
Platelets: 336 10*3/uL (ref 150–450)
RBC: 4.66 x10E6/uL (ref 3.77–5.28)
RDW: 13.4 % (ref 11.7–15.4)
RPR Ser Ql: NONREACTIVE
Rh Factor: POSITIVE
Rubella Antibodies, IGG: 2.65 {index} (ref >0.99)
WBC: 8.2 10*3/uL (ref 3.4–10.8)

## 2023-05-31 LAB — COMPREHENSIVE METABOLIC PANEL WITH GFR
ALT: 24 IU/L (ref 0–32)
AST: 15 IU/L (ref 0–40)
Albumin: 3.6 g/dL — ABNORMAL LOW (ref 3.9–4.9)
Alkaline Phosphatase: 67 IU/L (ref 44–121)
BUN/Creatinine Ratio: 13 (ref 9–23)
BUN: 8 mg/dL (ref 6–20)
Bilirubin Total: <0.2 mg/dL (ref 0.0–1.2)
CO2: 20 mmol/L (ref 20–29)
Calcium: 8.9 mg/dL (ref 8.7–10.2)
Chloride: 98 mmol/L (ref 96–106)
Creatinine, Ser: 0.62 mg/dL (ref 0.57–1.00)
Globulin, Total: 2.9 g/dL (ref 1.5–4.5)
Glucose: 284 mg/dL — ABNORMAL HIGH (ref 70–99)
Potassium: 4.7 mmol/L (ref 3.5–5.2)
Sodium: 134 mmol/L (ref 134–144)
Total Protein: 6.5 g/dL (ref 6.0–8.5)
eGFR: 119 mL/min/{1.73_m2} (ref >59)

## 2023-05-31 LAB — HEMOGLOBIN A1C
Est. average glucose Bld gHb Est-mCnc: 189 mg/dL
Hgb A1c MFr Bld: 8.2 % — ABNORMAL HIGH (ref 4.8–5.6)

## 2023-05-31 LAB — HCV INTERPRETATION

## 2023-06-03 NOTE — Progress Notes (Deleted)
 Patient was seen for  Pre-existing Diabetes During Pregnancy on 06/06/2023   Start time *** and End time ***   Estimated due date: 11/162025;[redacted]w[redacted]d   Clinical: Medications: *** Medical History: *** Labs:  Lab Results  Component Value Date   HGBA1C 8.2 (H) 05/29/2023    Dietary and Lifestyle History: ***  Physical Activity: *** Stress: *** Sleep: ***  24 hr Recall:  First Meal:  *** Snack:  *** Second meal:  *** Snack:  *** Third meal:  *** Snack:  *** Beverages:  ***  NUTRITION INTERVENTION  Nutrition education (E-1) on the following topics:   Initial Follow-up  []  []  Definition of Gestational Diabetes []  []  Why dietary management is important in controlling blood glucose []  []  Effects each nutrient has on blood glucose levels []  []  Simple carbohydrates vs complex carbohydrates []  []  Fluid intake []  []  Creating a balanced meal plan []  []  Carbohydrate counting  []  []  When to check blood glucose levels []  []  Proper blood glucose monitoring techniques []  []  Effect of stress and stress reduction techniques  []  []  Exercise effect on blood glucose levels, appropriate exercise during pregnancy []  []  Importance of limiting caffeine and abstaining from alcohol and smoking []  []  Medications used for blood sugar control during pregnancy []  []  Hypoglycemia and rule of 15 []  []  Postpartum self care  Blood glucose monitor given: *** Lot # *** Exp: *** CBG: *** mg/dL  *** Patient has a meter prior to visit. Patient is *** testing pre breakfast and 2 hours after each meal. FBS: *** Postprandial: ***  Patient instructed to monitor glucose levels: FBS: 60 - <= 95 mg/dL; 2 hour: <= 696 mg/dL  Patient received handouts: Nutrition Diabetes and Pregnancy Carbohydrate Counting List Blood glucose log Snack ideas for diabetes during pregnancy  Patient will be seen for follow-up as needed.

## 2023-06-06 ENCOUNTER — Other Ambulatory Visit

## 2023-06-06 DIAGNOSIS — O0990 Supervision of high risk pregnancy, unspecified, unspecified trimester: Secondary | ICD-10-CM

## 2023-06-06 DIAGNOSIS — O24111 Pre-existing diabetes mellitus, type 2, in pregnancy, first trimester: Secondary | ICD-10-CM

## 2023-06-10 NOTE — Progress Notes (Unsigned)
 Patient was seen for Pre-existing Diabetes During Pregnancy on 06/13/2023   Start time 1618 and End time 1727   Estimated due date: 12/22/2023;[redacted]w[redacted]d   Clinical: Medications:  Current Outpatient Medications:    ACCU-CHEK GUIDE test strip, , Disp: , Rfl:    Accu-Chek Softclix Lancets lancets, daily., Disp: , Rfl:    acetaminophen  (TYLENOL ) 325 MG tablet, Take 2 tablets (650 mg total) by mouth every 4 (four) hours as needed for moderate pain (for pain scale < 4)., Disp: , Rfl:    aspirin  81 MG chewable tablet, Chew 2 tablets (162 mg total) by mouth daily., Disp: 60 tablet, Rfl: 8   B-D ULTRAFINE III SHORT PEN 31G X 8 MM MISC, daily., Disp: , Rfl:    Blood Glucose Monitoring Suppl (ACCU-CHEK GUIDE) w/Device KIT, , Disp: , Rfl:    Continuous Glucose Sensor (DEXCOM G7 SENSOR) MISC, USE AS DIRECTED, Disp: 3 each, Rfl: 10   insulin  glargine, 1 Unit Dial, (TOUJEO  SOLOSTAR) 300 UNIT/ML Solostar Pen, Inject 30 Units into the skin at bedtime., Disp: , Rfl:    metFORMIN  (GLUCOPHAGE ) 500 MG tablet, Take 2 tablets (1,000 mg total) by mouth 2 (two) times daily with a meal., Disp: 120 tablet, Rfl: 9   NIFEdipine  (PROCARDIA -XL/NIFEDICAL-XL) 30 MG 24 hr tablet, Take 1 tablet (30 mg total) by mouth daily. Can increase to twice a day as needed for symptomatic contractions, Disp: 30 tablet, Rfl: 2   nitrofurantoin , macrocrystal-monohydrate, (MACROBID ) 100 MG capsule, Take 1 capsule (100 mg total) by mouth 2 (two) times daily. (Patient not taking: Reported on 05/29/2023), Disp: 14 capsule, Rfl: 0   ondansetron  (ZOFRAN -ODT) 4 MG disintegrating tablet, Take 1 tablet (4 mg total) by mouth every 8 (eight) hours as needed for nausea or vomiting., Disp: 15 tablet, Rfl: 0   Prenatal Vit-Fe Fumarate-FA (PREPLUS) 27-1 MG TABS, Take 1 tablet by mouth daily. (Patient not taking: Reported on 05/29/2023), Disp: 30 tablet, Rfl: 8  Medical History:  Past Medical History:  Diagnosis Date   Closed avulsion fracture of left ankle  08/14/2019   Depression    Diabetes mellitus without complication (HCC)    Hypertension    Pyelonephritis    Renal disorder    pyelonephritis   Seizures (HCC)    none since age 47   Labs:  Lab Results  Component Value Date   HGBA1C 8.2 (H) 05/29/2023   Lab Results  Component Value Date   NA 134 05/29/2023   CL 98 05/29/2023   K 4.7 05/29/2023   CO2 20 05/29/2023   BUN 8 05/29/2023   CREATININE 0.62 05/29/2023   EGFR 119 05/29/2023   CALCIUM 8.9 05/29/2023   ALBUMIN 3.6 (L) 05/29/2023   GLUCOSE 284 (H) 05/29/2023    Dietary and Lifestyle History: Pt present today alone. Pt denies having a blood sugar log to record her values-RD provided and encouraged. Pt reports a value of "119" mg/dL  before breakfast yesterday per CGM. Pt reports she plans to start a new CGM sensor "tonight". Pt reports she performed a finger prick at work and states fasting blood sugar today was "112" mg/dL. Pt reports she leaves her meter at work-RD encouraged Pt to keep meter with her at all times. Pt reports she monitored again after lunch. Pt denies carb counting. Pt reports working full time mostly sitting. Pt reports stress decreases her appetite. Pt reports she desires to limit weight gain to 25 pounds during this encounter pregnancy. Pt reports she is taking daily prenatal gummie  daily. Pt reports she recently decreased sweet tea from her dietary intake-RD congratulated Pt on making this lifestyle change and encouraged and discussed further strategies available in an effort to optimal control blood sugars.  RD encouraged Pt to carry a fast acting sugar source at all times. Pt encouraged to reach out if her blood sugar remain over the target more 3 times in a week.  All Pt's questions were answered during this encounter.    Physical Activity: ADL's Stress: "I'm stressed" / self care includes cleaning, talk with mom, prayer  Sleep: interrupted average hour 4-6 hour per Pt reporting  24 hr Recall:  First  Meal:  skips or [place] sausage, egg, cheese skinny biscuit, grits with butter, coffee with hazel nut creamer, splenda (202 mg/dL) Snack:  none or 9-6.0 cups of melon, SF or light whipped cream or Second meal: crusted parmesan grilled chicken, broccoli, baked sweet potato with sugar and cinnamon (173 mg/dL)  Snack:  "bowl" sun chips or 1 small bag or  Third meal:  [employer] fish sandwich with lettuce, tomatoes, fries,unknown ounces sprite  Snack:  2 chips a hoy chocolate cookie, 1/2 cup of 2% or cheese stick  "Middle of the night snack": bomb pop  Beverages:  coffee with hazel nut creamer, splenda, 2% milk, water, zero or diet soda, juice  NUTRITION INTERVENTION  Nutrition education (E-1) on the following topics:   Initial Follow-up  [x]  []  Diabetes in Pregnancy  [x]  []  Why dietary management is important in controlling blood glucose [x]  []  Effects each nutrient has on blood glucose levels [x]  []  Simple carbohydrates vs complex carbohydrates [x]  []  Fluid intake [x]  []  Creating a balanced meal plan [x]  []  Carbohydrate counting  [x]  []  When to check blood glucose levels [x]  []  Proper blood glucose monitoring techniques [x]  []  Effect of stress and stress reduction techniques  [x]  []  Exercise effect on blood glucose levels, appropriate exercise during pregnancy [x]  []  Importance of limiting caffeine and abstaining from alcohol and smoking [x]  []  Medications used for blood sugar control during pregnancy [x]  []  Hypoglycemia and rule of 15 []  []  Postpartum self care   Patient has a meter and Dexcom G7 prior to visit. Patient is instructed to testing pre breakfast and 2 hours after each meal.   Patient instructed to monitor glucose levels: QID FBS: 60 - <= 95 mg/dL; 2 hour: <= 454 mg/dL  Patient received handouts: Nutrition Diabetes and Pregnancy Carbohydrate Counting List Blood glucose log Snack ideas for diabetes during pregnancy  Patient will be seen 07/18/2023 for  follow up

## 2023-06-13 ENCOUNTER — Encounter: Attending: Obstetrics and Gynecology | Admitting: Dietician

## 2023-06-13 ENCOUNTER — Ambulatory Visit: Admitting: Dietician

## 2023-06-13 ENCOUNTER — Other Ambulatory Visit: Payer: Self-pay

## 2023-06-13 DIAGNOSIS — O24111 Pre-existing diabetes mellitus, type 2, in pregnancy, first trimester: Secondary | ICD-10-CM

## 2023-06-13 DIAGNOSIS — O0991 Supervision of high risk pregnancy, unspecified, first trimester: Secondary | ICD-10-CM

## 2023-06-13 DIAGNOSIS — O0990 Supervision of high risk pregnancy, unspecified, unspecified trimester: Secondary | ICD-10-CM | POA: Diagnosis not present

## 2023-06-13 DIAGNOSIS — Z3A12 12 weeks gestation of pregnancy: Secondary | ICD-10-CM

## 2023-06-14 ENCOUNTER — Encounter: Payer: Self-pay | Admitting: Obstetrics and Gynecology

## 2023-06-19 ENCOUNTER — Ambulatory Visit: Payer: Self-pay | Admitting: Obstetrics and Gynecology

## 2023-06-19 ENCOUNTER — Encounter: Payer: Self-pay | Admitting: Obstetrics and Gynecology

## 2023-06-19 ENCOUNTER — Other Ambulatory Visit: Payer: Self-pay

## 2023-06-19 VITALS — BP 138/91 | HR 100

## 2023-06-19 DIAGNOSIS — O10011 Pre-existing essential hypertension complicating pregnancy, first trimester: Secondary | ICD-10-CM

## 2023-06-19 DIAGNOSIS — Z3A13 13 weeks gestation of pregnancy: Secondary | ICD-10-CM | POA: Diagnosis not present

## 2023-06-19 DIAGNOSIS — Z3480 Encounter for supervision of other normal pregnancy, unspecified trimester: Secondary | ICD-10-CM

## 2023-06-19 DIAGNOSIS — O24119 Pre-existing diabetes mellitus, type 2, in pregnancy, unspecified trimester: Secondary | ICD-10-CM

## 2023-06-19 DIAGNOSIS — D563 Thalassemia minor: Secondary | ICD-10-CM

## 2023-06-19 DIAGNOSIS — Z8759 Personal history of other complications of pregnancy, childbirth and the puerperium: Secondary | ICD-10-CM | POA: Diagnosis not present

## 2023-06-19 DIAGNOSIS — O09521 Supervision of elderly multigravida, first trimester: Secondary | ICD-10-CM

## 2023-06-19 DIAGNOSIS — Z6839 Body mass index (BMI) 39.0-39.9, adult: Secondary | ICD-10-CM

## 2023-06-19 DIAGNOSIS — O99211 Obesity complicating pregnancy, first trimester: Secondary | ICD-10-CM

## 2023-06-19 DIAGNOSIS — O99212 Obesity complicating pregnancy, second trimester: Secondary | ICD-10-CM

## 2023-06-19 DIAGNOSIS — O24111 Pre-existing diabetes mellitus, type 2, in pregnancy, first trimester: Secondary | ICD-10-CM | POA: Diagnosis not present

## 2023-06-19 DIAGNOSIS — O10019 Pre-existing essential hypertension complicating pregnancy, unspecified trimester: Secondary | ICD-10-CM

## 2023-06-19 NOTE — Progress Notes (Unsigned)
 PRENATAL VISIT NOTE  Subjective:  Joanne Waller is a 38 y.o. [id number] at [redacted]w[redacted]d being seen today for ongoing prenatal care.  She is currently monitored for the following issues for this high-risk pregnancy and has Pre-existing type 2 diabetes affecting pregnancy, antepartum; Pre-existing essential hypertension during pregnancy, antepartum; Alpha thalassemia silent carrier; Supervision of other normal pregnancy, antepartum; AMA (advanced maternal age) multigravida 35+; Obesity affecting pregnancy; BMI 39.0-39.9,adult; and History of severe pre-eclampsia on their problem list.  Patient reports no complaints.  Contractions: Not present. Vag. Bleeding: None.  Movement: Absent. Denies leaking of fluid.   The following portions of the patient's history were reviewed and updated as appropriate: allergies, current medications, past family history, past medical history, past social history, past surgical history and problem list.   Objective:    Vitals:   06/20/23 0816  BP: (!) 138/91  Pulse: 100    Fetal Status:  Fetal Heart Rate (bpm): 142   Movement: Absent    General: Alert, oriented and cooperative. Patient is in no acute distress.  Skin: Skin is warm and dry. No rash noted.   Cardiovascular: Normal heart rate noted  Respiratory: Normal respiratory effort, no problems with respiration noted  Abdomen: Soft, gravid, appropriate for gestational age.  Pain/Pressure: Absent     Pelvic: Cervical exam deferred        Extremities: Normal range of motion.  Edema: None  Mental Status: Normal mood and affect. Normal behavior. Normal judgment and thought content.   Assessment and Plan:  Pregnancy: [id number] at [redacted]w[redacted]d 1. [redacted] weeks gestation of pregnancy (Primary) Recommend afp next visit CMA looked up panorama results and to scan in results, and I confirmed that they are low risk  2. Pre-existing type 2 diabetes affecting pregnancy, antepartum Patient with CBGs in the 200 to 300s and eating  cereal. Importance of diet  and glycemic control d/w her in regards to maternal/fetal m&m; new OB A1c 8.2.  Patient still on toujeo  30 at bedtime which she was on prior to pregnancy, in addition to metformin  1000 bid.   I told her she needs to be on long and short acting. She was on semglee  and novolog  pens last pregnancy. Message sent to Greta to see if okay to do bid semglee  dosing instead of daily for better glycemic control.   Repeat A1c with afp  3. History of severe pre-eclampsia Confirms on asa 162 qday  4. Multigravida of advanced maternal age in first trimester  5. BMI 39.0-39.9,adult  6. Obesity affecting pregnancy in second trimester, unspecified obesity type  7. Pre-existing essential hypertension during pregnancy, antepartum Confirms on procardia  30 daily. Continue to follow but may need to adjust up next visit. Pt confirms she took it, as normal, a few hours ago  8. Alpha thalassemia silent carrier  9. Supervision of other normal pregnancy, antepartum  Preterm labor symptoms and general obstetric precautions including but not limited to vaginal bleeding, contractions, leaking of fluid and fetal movement were reviewed in detail with the patient. Please refer to After Visit Summary for other counseling recommendations.   Return in about 2 weeks (around 07/03/2023) for in person, md visit, high risk ob.  Future Appointments  Date Time Provider Department Center  07/08/2023  4:15 PM Lacey Pian, MD [facility]  07/17/2023  2:35 PM Ferdie Housekeeper, MD [facility]  07/18/2023  4:15 PM [facility]  08/06/2023 10:00 AM [facility] PROVIDER 1 [facility]  08/06/2023 10:30 AM [facility] US1 [facility]  [facility]    Raynell Caller, MD

## 2023-06-20 ENCOUNTER — Encounter: Payer: Self-pay | Admitting: Obstetrics and Gynecology

## 2023-06-21 ENCOUNTER — Other Ambulatory Visit: Payer: Self-pay | Admitting: Obstetrics and Gynecology

## 2023-06-21 MED ORDER — INSULIN GLARGINE-YFGN 100 UNIT/ML ~~LOC~~ SOPN: 15.0000 [IU] | PEN_INJECTOR | Freq: Two times a day (BID) | SUBCUTANEOUS | 3 refills | Status: DC

## 2023-06-21 MED ORDER — NOVOLOG FLEXPEN 100 UNIT/ML ~~LOC~~ SOPN: 20.0000 [IU] | PEN_INJECTOR | Freq: Three times a day (TID) | SUBCUTANEOUS | 3 refills | Status: DC

## 2023-06-28 ENCOUNTER — Other Ambulatory Visit: Payer: Self-pay | Admitting: Obstetrics and Gynecology

## 2023-07-08 ENCOUNTER — Other Ambulatory Visit: Payer: Self-pay

## 2023-07-08 ENCOUNTER — Ambulatory Visit (INDEPENDENT_AMBULATORY_CARE_PROVIDER_SITE_OTHER): Admitting: Obstetrics and Gynecology

## 2023-07-08 ENCOUNTER — Encounter: Payer: Self-pay | Admitting: Obstetrics and Gynecology

## 2023-07-08 VITALS — BP 151/96 | HR 106 | Wt 277.0 lb

## 2023-07-08 DIAGNOSIS — Z3A16 16 weeks gestation of pregnancy: Secondary | ICD-10-CM

## 2023-07-08 DIAGNOSIS — O10019 Pre-existing essential hypertension complicating pregnancy, unspecified trimester: Secondary | ICD-10-CM

## 2023-07-08 DIAGNOSIS — D563 Thalassemia minor: Secondary | ICD-10-CM

## 2023-07-08 DIAGNOSIS — O10012 Pre-existing essential hypertension complicating pregnancy, second trimester: Secondary | ICD-10-CM | POA: Diagnosis not present

## 2023-07-08 DIAGNOSIS — Z3480 Encounter for supervision of other normal pregnancy, unspecified trimester: Secondary | ICD-10-CM

## 2023-07-08 DIAGNOSIS — O99212 Obesity complicating pregnancy, second trimester: Secondary | ICD-10-CM

## 2023-07-08 DIAGNOSIS — O09521 Supervision of elderly multigravida, first trimester: Secondary | ICD-10-CM

## 2023-07-08 DIAGNOSIS — O99112 Other diseases of the blood and blood-forming organs and certain disorders involving the immune mechanism complicating pregnancy, second trimester: Secondary | ICD-10-CM

## 2023-07-08 DIAGNOSIS — Z8759 Personal history of other complications of pregnancy, childbirth and the puerperium: Secondary | ICD-10-CM

## 2023-07-08 DIAGNOSIS — O24119 Pre-existing diabetes mellitus, type 2, in pregnancy, unspecified trimester: Secondary | ICD-10-CM | POA: Diagnosis not present

## 2023-07-08 DIAGNOSIS — E669 Obesity, unspecified: Secondary | ICD-10-CM | POA: Diagnosis not present

## 2023-07-08 DIAGNOSIS — O09522 Supervision of elderly multigravida, second trimester: Secondary | ICD-10-CM

## 2023-07-08 MED ORDER — NIFEDIPINE ER OSMOTIC RELEASE 60 MG PO TB24: 60.0000 mg | ORAL_TABLET | Freq: Every day | ORAL | 3 refills | Status: AC

## 2023-07-08 MED ORDER — INSULIN GLARGINE-YFGN 100 UNIT/ML ~~LOC~~ SOPN: 20.0000 [IU] | PEN_INJECTOR | Freq: Two times a day (BID) | SUBCUTANEOUS | 3 refills | Status: DC

## 2023-07-08 NOTE — Progress Notes (Addendum)
   PRENATAL VISIT NOTE  Subjective:  Joanne Waller is a 38 y.o. [id number] at [redacted]w[redacted]d being seen today for ongoing prenatal care.  She is currently monitored for the following issues for this high-risk pregnancy and has Pre-existing type 2 diabetes affecting pregnancy, antepartum; Pre-existing essential hypertension during pregnancy, antepartum; Alpha thalassemia silent carrier; Supervision of other normal pregnancy, antepartum; AMA (advanced maternal age) multigravida 35+; Obesity affecting pregnancy; BMI 39.0-39.9,adult; and History of severe pre-eclampsia on their problem list.  Patient reports no complaints.  Contractions: Not present. Vag. Bleeding: None.  Movement: Present. Denies leaking of fluid.   The following portions of the patient's history were reviewed and updated as appropriate: allergies, current medications, past family history, past medical history, past social history, past surgical history and problem list.   Objective:    Vitals:   07/08/23 1629  BP: (!) 151/96  Pulse: (!) 106  Weight: 277 lb (125.6 kg)    Fetal Status:  Fetal Heart Rate (bpm): 150   Movement: Present    General: Alert, oriented and cooperative. Patient is in no acute distress.  Skin: Skin is warm and dry. No rash noted.   Cardiovascular: Normal heart rate noted  Respiratory: Normal respiratory effort, no problems with respiration noted  Abdomen: Soft, gravid, appropriate for gestational age.  Pain/Pressure: Absent     Pelvic: Cervical exam deferred        Extremities: Normal range of motion.  Edema: None  Mental Status: Normal mood and affect. Normal behavior. Normal judgment and thought content.   Assessment and Plan:  Pregnancy: [id number] at [redacted]w[redacted]d 1. Pre-existing type 2 diabetes affecting pregnancy, antepartum (Primary) Semglee  15 bid plus Novolog  20 with meals. Was taking Novolog  after meals, but now doing 20 minutes before meals. MTF 1000 bid.  Increase Semglee  to 20 BID. Leave meals as is.   Starting A1C was 8.2 (much improved from 1 yr ago - 12.1) She has CGM - will give her log in for [facility].  Avg CBG is 201, 34% in range.   2. Pre-existing essential hypertension during pregnancy, antepartum Continue ldASA Baseline labs wnl Procardia  30 xl daily - took it this morning. Increase to 60 mg.  Info given on babyscripts.  Doesn't have a cuff.   3. Supervision of other normal pregnancy, antepartum MSAFP recommended today - pt accepts Discussed birth control - she would like Cu-IUD.   4. Obesity affecting pregnancy in second trimester, unspecified obesity type  5. History of severe pre-eclampsia Baseline labs and ldASA as noted. Continue 162 mg.   6. Multigravida of advanced maternal age in first trimester LR panorama in portal.   7. Alpha thalassemia silent carrier New partner, kit given.   8. Pregnancy with 16 completed weeks gestation  Preterm labor symptoms and general obstetric precautions including but not limited to vaginal bleeding, contractions, leaking of fluid and fetal movement were reviewed in detail with the patient. Please refer to After Visit Summary for other counseling recommendations.   Return in about 4 weeks (around 08/05/2023) for HROB VISIT.  Future Appointments  Date Time Provider Department Center  07/18/2023  4:15 PM [facility]  07/22/2023  4:15 PM Ferdie Housekeeper, MD [facility]  08/06/2023 10:00 AM [facility] PROVIDER 1 [facility]  08/06/2023 10:30 AM [facility] US1 [facility]    Lacey Pian, MD

## 2023-07-08 NOTE — Patient Instructions (Addendum)
 Download Dexcom G7 app  [facility]  invites you to share your continuous glucose monitoring (CGM) data using Dexcom Clarity.  Dexcom Clarity displays your CGM data in graphs so you and your clinic can view patterns, trends, and statistics anytime.  To start sharing data with this clinic, enter the clinic code in your Clarity app, Clarity web, or at [email or web address]  Clinic code: [facility]

## 2023-07-09 ENCOUNTER — Telehealth: Payer: Self-pay | Admitting: Family Medicine

## 2023-07-09 ENCOUNTER — Ambulatory Visit: Payer: Self-pay | Admitting: Obstetrics and Gynecology

## 2023-07-09 DIAGNOSIS — O24119 Pre-existing diabetes mellitus, type 2, in pregnancy, unspecified trimester: Secondary | ICD-10-CM | POA: Diagnosis not present

## 2023-07-09 DIAGNOSIS — O10019 Pre-existing essential hypertension complicating pregnancy, unspecified trimester: Secondary | ICD-10-CM | POA: Diagnosis not present

## 2023-07-09 LAB — HEMOGLOBIN A1C
Est. average glucose Bld gHb Est-mCnc: 192 mg/dL
Hgb A1c MFr Bld: 8.3 % — ABNORMAL HIGH (ref 4.8–5.6)

## 2023-07-09 NOTE — Telephone Encounter (Signed)
 Good Afternoon, Dr.Duncan wants this patient to be scheduled with Jen rasch virtual in 2 weeks please.

## 2023-07-10 ENCOUNTER — Telehealth: Payer: Self-pay | Admitting: *Deleted

## 2023-07-10 LAB — AFP, SERUM, OPEN SPINA BIFIDA
AFP MoM: 1.34
AFP Value: 27.7 ng/mL
Gest. Age on Collection Date: 16.1 wk
Maternal Age At EDD: 36.1 a
OSBR Risk 1 IN: 2551
Test Results:: NEGATIVE
Weight: 277 [lb_av]

## 2023-07-10 LAB — PROTEIN / CREATININE RATIO, URINE
Creatinine, Urine: 109.1 mg/dL
Protein, Ur: 15.6 mg/dL
Protein/Creat Ratio: 143 mg/g{creat} (ref 0–200)

## 2023-07-10 NOTE — Telephone Encounter (Signed)
 Left patient a message to call the office to schedule virtual DM appointment with Almond Jaffe. Will need to be scheduled for 06/13.

## 2023-07-11 NOTE — Progress Notes (Deleted)
 Patient was seen for Pre-existing Diabetes During Pregnancy on ***/***/2025   Start time *** and End time ***   Estimated due date: 12/22/2023;***w***d   Clinical: Medications:  Current Outpatient Medications:    ACCU-CHEK GUIDE test strip, , Disp: , Rfl:    Accu-Chek Softclix Lancets lancets, daily., Disp: , Rfl:    acetaminophen  (TYLENOL ) 325 MG tablet, Take 2 tablets (650 mg total) by mouth every 4 (four) hours as needed for moderate pain (for pain scale < 4)., Disp: , Rfl:    aspirin  81 MG chewable tablet, Chew 2 tablets (162 mg total) by mouth daily., Disp: 60 tablet, Rfl: 8   B-D ULTRAFINE III SHORT PEN 31G X 8 MM MISC, daily., Disp: , Rfl:    Blood Glucose Monitoring Suppl (ACCU-CHEK GUIDE) w/Device KIT, , Disp: , Rfl:    Continuous Glucose Sensor (DEXCOM G7 SENSOR) MISC, USE AS DIRECTED, Disp: 3 each, Rfl: 10   insulin  aspart (NOVOLOG  FLEXPEN) 100 UNIT/ML FlexPen, Inject 20 Units into the skin 3 (three) times daily with meals., Disp: 15 mL, Rfl: 3   insulin  glargine-yfgn (SEMGLEE ) 100 UNIT/ML Pen, Inject 20 Units into the skin every 12 (twelve) hours., Disp: 12 mL, Rfl: 3   metFORMIN  (GLUCOPHAGE ) 500 MG tablet, Take 2 tablets (1,000 mg total) by mouth 2 (two) times daily with a meal., Disp: 120 tablet, Rfl: 9   NIFEdipine  (PROCARDIA  XL/NIFEDICAL XL) 60 MG 24 hr tablet, Take 1 tablet (60 mg total) by mouth daily., Disp: 90 tablet, Rfl: 3   Prenatal Vit-Fe Fumarate-FA (PREPLUS) 27-1 MG TABS, Take 1 tablet by mouth daily. (Patient not taking: Reported on 05/29/2023), Disp: 30 tablet, Rfl: 8  Medical History:  Past Medical History:  Diagnosis Date   Closed avulsion fracture of left ankle 08/14/2019   Depression    Diabetes mellitus without complication (HCC)    Hypertension    Pyelonephritis    Renal disorder    pyelonephritis   Seizures (HCC)    none since age 63   Labs:  Lab Results  Component Value Date   HGBA1C 8.3 (H) 07/08/2023   Lab Results  Component Value Date   NA  134 05/29/2023   CL 98 05/29/2023   K 4.7 05/29/2023   CO2 20 05/29/2023   BUN 8 05/29/2023   CREATININE 0.62 05/29/2023   EGFR 119 05/29/2023   CALCIUM 8.9 05/29/2023   ALBUMIN 3.6 (L) 05/29/2023   GLUCOSE 284 (H) 05/29/2023    Dietary and Lifestyle History:    06/13/2023: Pt present today alone. Pt denies having a blood sugar log to record her values-RD provided and encouraged. Pt reports a value of "119" mg/dL  before breakfast yesterday per CGM. Pt reports she plans to start a new CGM sensor "tonight". Pt reports she performed a finger prick at work and states fasting blood sugar today was "112" mg/dL. Pt reports she leaves her meter at work-RD encouraged Pt to keep meter with her at all times. Pt reports she monitored again after lunch. Pt denies carb counting. Pt reports working full time mostly sitting. Pt reports stress decreases her appetite. Pt reports she desires to limit weight gain to 25 pounds during this encounter pregnancy. Pt reports she is taking daily prenatal gummie daily. Pt reports she recently decreased sweet tea from her dietary intake-RD congratulated Pt on making this lifestyle change and encouraged and discussed further strategies available in an effort to optimal control blood sugars.  RD encouraged Pt to carry a fast acting  sugar source at all times. Pt encouraged to reach out if her blood sugar remain over the target more 3 times in a week.  All Pt's questions were answered during this encounter.    Physical Activity: ADL's Stress: "I'm stressed" / self care includes cleaning, talk with mom, prayer  Sleep: interrupted average hour 4-6 hour per Pt reporting  24 hr Recall:  First Meal:  skips or [place] sausage, egg, cheese skinny biscuit, grits with butter, coffee with hazel nut creamer, splenda (202 mg/dL) Snack:  none or 1-6.1 cups of melon, SF or light whipped cream or Second meal: crusted parmesan grilled chicken, broccoli, baked sweet potato with  sugar and cinnamon (173 mg/dL)  Snack:  "bowl" sun chips or 1 small bag or  Third meal:  [employer] fish sandwich with lettuce, tomatoes, fries,unknown ounces sprite  Snack:  2 chips a hoy chocolate cookie, 1/2 cup of 2% or cheese stick  "Middle of the night snack": bomb pop  Beverages:  coffee with hazel nut creamer, splenda, 2% milk, water, zero or diet soda, juice  NUTRITION INTERVENTION  Nutrition education (E-1) on the following topics:   Initial Follow-up  [x]  []  Diabetes in Pregnancy  [x]  []  Why dietary management is important in controlling blood glucose [x]  []  Effects each nutrient has on blood glucose levels [x]  []  Simple carbohydrates vs complex carbohydrates [x]  []  Fluid intake [x]  []  Creating a balanced meal plan [x]  []  Carbohydrate counting  [x]  []  When to check blood glucose levels [x]  []  Proper blood glucose monitoring techniques [x]  []  Effect of stress and stress reduction techniques  [x]  []  Exercise effect on blood glucose levels, appropriate exercise during pregnancy [x]  []  Importance of limiting caffeine and abstaining from alcohol and smoking [x]  []  Medications used for blood sugar control during pregnancy [x]  []  Hypoglycemia and rule of 15 []  []  Postpartum self care   Patient has a meter and Dexcom G7 prior to visit. Patient is instructed to testing pre breakfast and 2 hours after each meal.   Patient instructed to monitor glucose levels: QID FBS: 60 - <= 95 mg/dL; 2 hour: <= 096 mg/dL  Patient received handouts: Nutrition Diabetes and Pregnancy Carbohydrate Counting List Blood glucose log Snack ideas for diabetes during pregnancy  Patient will be seen 07/18/2023 for follow up

## 2023-07-12 ENCOUNTER — Other Ambulatory Visit: Payer: Self-pay

## 2023-07-12 ENCOUNTER — Encounter: Payer: Self-pay | Admitting: Obstetrics and Gynecology

## 2023-07-12 ENCOUNTER — Inpatient Hospital Stay (HOSPITAL_COMMUNITY)
Admission: AD | Admit: 2023-07-12 | Discharge: 2023-07-12 | Disposition: A | Discharge status: Home / Self Care | Attending: Obstetrics and Gynecology | Admitting: Obstetrics and Gynecology

## 2023-07-12 DIAGNOSIS — Z7984 Long term (current) use of oral hypoglycemic drugs: Secondary | ICD-10-CM | POA: Diagnosis not present

## 2023-07-12 DIAGNOSIS — Z7982 Long term (current) use of aspirin: Secondary | ICD-10-CM | POA: Diagnosis not present

## 2023-07-12 DIAGNOSIS — O24112 Pre-existing diabetes mellitus, type 2, in pregnancy, second trimester: Secondary | ICD-10-CM | POA: Insufficient documentation

## 2023-07-12 DIAGNOSIS — Z8249 Family history of ischemic heart disease and other diseases of the circulatory system: Secondary | ICD-10-CM | POA: Insufficient documentation

## 2023-07-12 DIAGNOSIS — Z3A16 16 weeks gestation of pregnancy: Secondary | ICD-10-CM

## 2023-07-12 DIAGNOSIS — Z79899 Other long term (current) drug therapy: Secondary | ICD-10-CM | POA: Diagnosis not present

## 2023-07-12 DIAGNOSIS — Z0130 Encounter for examination of blood pressure without abnormal findings: Secondary | ICD-10-CM

## 2023-07-12 DIAGNOSIS — O09292 Supervision of pregnancy with other poor reproductive or obstetric history, second trimester: Secondary | ICD-10-CM | POA: Diagnosis not present

## 2023-07-12 DIAGNOSIS — Z794 Long term (current) use of insulin: Secondary | ICD-10-CM | POA: Diagnosis not present

## 2023-07-12 DIAGNOSIS — O10019 Pre-existing essential hypertension complicating pregnancy, unspecified trimester: Secondary | ICD-10-CM

## 2023-07-12 DIAGNOSIS — O10012 Pre-existing essential hypertension complicating pregnancy, second trimester: Secondary | ICD-10-CM

## 2023-07-12 NOTE — MAU Provider Note (Signed)
 History     CSN: [id number]  Arrival date and time: 07/12/23 1002   None     Chief Complaint  Patient presents with   BP Evaluation   HPI  Ms.Joanne Waller is a 38 y.o. female [id number] @ [redacted]w[redacted]d with a history of cHTN here with concerns about her home BP readings. She reports BP's 137/101 and 106/95. She was previously on procardia  30 mg XL and was recently asked to increase her dose to 60 mg daily. She has been taking 60 mg as directed. She has no HA or vision Changes. Does not have her BP cuff with her today  Pregnancy also complicated by Type 2 DM.  Hx of severe preeclampsia. Taking BASA 162 mg daily.   OB History     Gravida  5   Para  2   Term  2   Preterm      AB  2   Living  2      SAB  1   IAB  1   Ectopic      Multiple  0   Live Births  2        Obstetric Comments  2023 Magnesium  for BP         Past Medical History:  Diagnosis Date   Closed avulsion fracture of left ankle 08/14/2019   Depression    Diabetes mellitus without complication (HCC)    Hypertension    Pyelonephritis    Renal disorder    pyelonephritis   Seizures (HCC)    none since age 79    Past Surgical History:  Procedure Laterality Date   CHOLECYSTECTOMY     DILATION AND CURETTAGE OF UTERUS      Family History  Problem Relation Age of Onset   Kidney disease Mother        kidney stones   Hypertension Mother    Sickle cell anemia Father    Hyperlipidemia Maternal Grandmother     Social History   Tobacco Use   Smoking status: Former    Current packs/day: 0.00    Average packs/day: 0.5 packs/day for 7.0 years (3.5 ttl pk-yrs)    Types: Cigarettes    Start date: 06/23/2013    Quit date: 06/23/2020    Years since quitting: 3.0   Smokeless tobacco: Never  Vaping Use   Vaping status: Never Used  Substance Use Topics   Alcohol use: Not Currently    Comment: not while preg   Drug use: No    Allergies:  Allergies  Allergen Reactions   Amoxicillin Other (See  Comments)    Pt stated, "I get a severe blinding headache"   Penicillins Other (See Comments)    Sharp pain in temple    Latex Itching, Swelling and Rash    Medications Prior to Admission  Medication Sig Dispense Refill Last Dose/Taking   ACCU-CHEK GUIDE test strip       Accu-Chek Softclix Lancets lancets daily.      acetaminophen  (TYLENOL ) 325 MG tablet Take 2 tablets (650 mg total) by mouth every 4 (four) hours as needed for moderate pain (for pain scale < 4).      aspirin  81 MG chewable tablet Chew 2 tablets (162 mg total) by mouth daily. 60 tablet 8    B-D ULTRAFINE III SHORT PEN 31G X 8 MM MISC daily.      Blood Glucose Monitoring Suppl (ACCU-CHEK GUIDE) w/Device KIT       Continuous Glucose Sensor (DEXCOM  G7 SENSOR) MISC USE AS DIRECTED 3 each 10    insulin  aspart (NOVOLOG  FLEXPEN) 100 UNIT/ML FlexPen Inject 20 Units into the skin 3 (three) times daily with meals. 15 mL 3    insulin  glargine-yfgn (SEMGLEE ) 100 UNIT/ML Pen Inject 20 Units into the skin every 12 (twelve) hours. 12 mL 3    metFORMIN  (GLUCOPHAGE ) 500 MG tablet Take 2 tablets (1,000 mg total) by mouth 2 (two) times daily with a meal. 120 tablet 9    NIFEdipine  (PROCARDIA  XL/NIFEDICAL XL) 60 MG 24 hr tablet Take 1 tablet (60 mg total) by mouth daily. 90 tablet 3    Prenatal Vit-Fe Fumarate-FA (PREPLUS) 27-1 MG TABS Take 1 tablet by mouth daily. (Patient not taking: Reported on 05/29/2023) 30 tablet 8    No results found for this or any previous visit (from the past 48 hours).   Review of Systems  Eyes:  Negative for photophobia and visual disturbance.  Gastrointestinal:  Negative for abdominal pain.  Genitourinary:  Negative for vaginal bleeding.   Physical Exam   Blood pressure 113/71, pulse 97, temperature 97.8 F (36.6 C), temperature source Oral, resp. rate 18, height 5' 10.5" (1.791 m), weight 127.6 kg, last menstrual period 03/11/2023, SpO2 100%, unknown if currently breastfeeding.  Patient Vitals for the past  24 hrs:  BP Temp Temp src Pulse Resp SpO2 Height Weight  07/12/23 1106 113/71 -- -- 97 -- 100 % -- --  07/12/23 1056 (!) 111/94 -- -- 96 -- 98 % -- --  07/12/23 1046 116/81 -- -- 95 -- 100 % -- --  07/12/23 1033 125/81 97.8 F (36.6 C) Oral (!) 104 18 99 % -- --  07/12/23 1027 -- -- -- -- -- -- 5' 10.5" (1.791 m) 127.6 kg     Physical Exam Constitutional:      General: She is not in acute distress.    Appearance: Normal appearance. She is not ill-appearing, toxic-appearing or diaphoretic.  Skin:    General: Skin is warm.  Neurological:     Mental Status: She is alert and oriented to person, place, and time.  Psychiatric:        Mood and Affect: Mood normal.    MAU Course  Procedures  MDM  BP's in MAU reassuring.   Assessment and Plan   A:  1. Pre-existing essential hypertension during pregnancy, antepartum   2. [redacted] weeks gestation of pregnancy   3. Blood pressure check      P:  Dc home Bring your home BP cuff to your next OB visit Continue procardia  60 mg XL daily Continue BASA 162 mg daily Preeclampsia precautions   Joanne Waller, Joanne Oh, NP 07/12/2023 3:56 PM

## 2023-07-12 NOTE — MAU Note (Signed)
 Joanne Waller is a 38 y.o. at [redacted]w[redacted]d here in MAU reporting: she took her BP at home and it "was high", states BP's were 137/101 & 106/95.  States took Nifedipine  60 mg after taking BP. Denies VB or LOF.  LMP: 03/11/2023 Onset of complaint: last night Pain score: 0 Vitals:   07/12/23 1033  BP: 125/81  Pulse: (!) 104  Resp: 18  Temp: 97.8 F (36.6 C)  SpO2: 99%     FHT: 145 bpm  Lab orders placed from triage: None

## 2023-07-15 ENCOUNTER — Telehealth: Payer: Self-pay | Admitting: Lactation Services

## 2023-07-15 NOTE — Telephone Encounter (Signed)
 Joanne Waller (KeyCreola Doheny) Need Help? Call us  at [phone] Outcome Additional Information Required Prior Authorization Not Required Drug Semglee  (yfgn) 100UNIT/ML pen-injectors ePA cloud logo Form [employer] [place] Commercial Electronic Request Form  Received PA request for Semglee . Per Covermymeds, PA not required.   Called [employer] on [address] to clarify. They report medication did go through insurance however they are out and need to order the medication. Will let patient know via Mychart.

## 2023-07-16 ENCOUNTER — Telehealth: Payer: Self-pay | Admitting: *Deleted

## 2023-07-16 NOTE — Telephone Encounter (Signed)
 Left patient an urgent message to call and schedule virtual DM appointments with Almond Jaffe.

## 2023-07-17 ENCOUNTER — Encounter: Admitting: Family Medicine

## 2023-07-17 ENCOUNTER — Other Ambulatory Visit: Payer: Self-pay | Admitting: Obstetrics and Gynecology

## 2023-07-18 ENCOUNTER — Ambulatory Visit

## 2023-07-18 ENCOUNTER — Other Ambulatory Visit: Payer: Self-pay

## 2023-07-18 DIAGNOSIS — O24111 Pre-existing diabetes mellitus, type 2, in pregnancy, first trimester: Secondary | ICD-10-CM

## 2023-07-18 DIAGNOSIS — O0990 Supervision of high risk pregnancy, unspecified, unspecified trimester: Secondary | ICD-10-CM
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
## 2023-07-21 ENCOUNTER — Other Ambulatory Visit: Payer: Self-pay | Admitting: Obstetrics and Gynecology

## 2023-07-21 DIAGNOSIS — O24111 Pre-existing diabetes mellitus, type 2, in pregnancy, first trimester: Secondary | ICD-10-CM

## 2023-07-21 MED ORDER — DEXCOM G7 SENSOR MISC
10 refills | Status: DC
Start: 2023-07-21 — End: 2023-12-10

## 2023-07-22 ENCOUNTER — Encounter: Admitting: Family Medicine

## 2023-07-24 ENCOUNTER — Ambulatory Visit

## 2023-07-25 ENCOUNTER — Encounter: Attending: Obstetrics and Gynecology | Admitting: Dietician

## 2023-07-25 ENCOUNTER — Ambulatory Visit

## 2023-07-25 ENCOUNTER — Other Ambulatory Visit: Payer: Self-pay

## 2023-07-25 DIAGNOSIS — Z3A18 18 weeks gestation of pregnancy: Secondary | ICD-10-CM

## 2023-07-25 DIAGNOSIS — O24119 Pre-existing diabetes mellitus, type 2, in pregnancy, unspecified trimester: Secondary | ICD-10-CM

## 2023-07-25 DIAGNOSIS — O24111 Pre-existing diabetes mellitus, type 2, in pregnancy, first trimester: Secondary | ICD-10-CM | POA: Insufficient documentation

## 2023-07-25 DIAGNOSIS — O0990 Supervision of high risk pregnancy, unspecified, unspecified trimester: Secondary | ICD-10-CM | POA: Diagnosis not present

## 2023-07-25 DIAGNOSIS — O0992 Supervision of high risk pregnancy, unspecified, second trimester: Secondary | ICD-10-CM

## 2023-07-25 DIAGNOSIS — O24112 Pre-existing diabetes mellitus, type 2, in pregnancy, second trimester: Secondary | ICD-10-CM

## 2023-07-26 ENCOUNTER — Encounter: Payer: Self-pay | Admitting: Advanced Practice Midwife

## 2023-08-05 ENCOUNTER — Ambulatory Visit: Admitting: Advanced Practice Midwife

## 2023-08-05 ENCOUNTER — Other Ambulatory Visit: Payer: Self-pay

## 2023-08-05 VITALS — BP 121/88 | HR 120 | Wt 280.0 lb

## 2023-08-05 DIAGNOSIS — Z3A20 20 weeks gestation of pregnancy: Secondary | ICD-10-CM

## 2023-08-05 DIAGNOSIS — O10019 Pre-existing essential hypertension complicating pregnancy, unspecified trimester: Secondary | ICD-10-CM

## 2023-08-05 DIAGNOSIS — Z6839 Body mass index (BMI) 39.0-39.9, adult: Secondary | ICD-10-CM

## 2023-08-05 DIAGNOSIS — O10012 Pre-existing essential hypertension complicating pregnancy, second trimester: Secondary | ICD-10-CM

## 2023-08-05 DIAGNOSIS — O24119 Pre-existing diabetes mellitus, type 2, in pregnancy, unspecified trimester: Secondary | ICD-10-CM | POA: Diagnosis not present

## 2023-08-05 DIAGNOSIS — O99112 Other diseases of the blood and blood-forming organs and certain disorders involving the immune mechanism complicating pregnancy, second trimester: Secondary | ICD-10-CM | POA: Diagnosis not present

## 2023-08-05 DIAGNOSIS — O09522 Supervision of elderly multigravida, second trimester: Secondary | ICD-10-CM

## 2023-08-05 DIAGNOSIS — Z3480 Encounter for supervision of other normal pregnancy, unspecified trimester: Secondary | ICD-10-CM

## 2023-08-05 DIAGNOSIS — D563 Thalassemia minor: Secondary | ICD-10-CM

## 2023-08-05 MED ORDER — INSULIN PEN NEEDLE 31G X 8 MM MISC: 1.0000 | Freq: Every day | 12 refills | Status: AC

## 2023-08-05 MED ORDER — NOVOLOG FLEXPEN 100 UNIT/ML ~~LOC~~ SOPN: 20.0000 [IU] | PEN_INJECTOR | Freq: Three times a day (TID) | SUBCUTANEOUS | 12 refills | Status: DC

## 2023-08-05 NOTE — Progress Notes (Unsigned)
   PRENATAL VISIT NOTE  Subjective:  Joanne Waller is a 38 y.o. H4E7977 at 104w1d being seen today for ongoing prenatal care.  She is currently monitored for the following issues for this high-risk pregnancy and has Pre-existing type 2 diabetes affecting pregnancy, antepartum; Pre-existing essential hypertension during pregnancy, antepartum; Alpha thalassemia silent carrier; Supervision of other normal pregnancy, antepartum; AMA (advanced maternal age) multigravida 35+; Obesity affecting pregnancy; BMI 39.0-39.9,adult; and History of severe pre-eclampsia on their problem list.   Patient reports backache.  Contractions: Not present. Vag. Bleeding: None.  Movement: Present. Denies leaking of fluid.   The following portions of the patient's history were reviewed and updated as appropriate: allergies, current medications, past family history, past medical history, past social history, past surgical history and problem list.   Objective:   Vitals:   08/05/23 1645  BP: 121/88  Pulse: (!) 120  Weight: 280 lb (127 kg)    Fetal Status: Fetal Heart Rate (bpm): 150   Movement: Present     General:  Alert, oriented and cooperative. Patient is in no acute distress.  Skin: Skin is warm and dry. No rash noted.   Cardiovascular: Normal heart rate noted  Respiratory: Normal respiratory effort, no problems with respiration noted  Abdomen: Soft, gravid, appropriate for gestational age.  Pain/Pressure: Absent     Pelvic: Cervical exam deferred        Extremities: Normal range of motion.  Edema: None  Mental Status: Normal mood and affect. Normal behavior. Normal judgment and thought content.   Fasting CBGs: 120's (100% out of range) PCB/L/D:  150-160 (100% out of range)  Assessment and Plan:  Pregnancy: H4E7977 at [redacted]w[redacted]d 1. [redacted] weeks gestation of pregnancy (Primary) - AFP Nml 2. BMI 39.0-39.9,adult  3. Multigravida of advanced maternal age in second trimester - Antenatal testing per MFM  4. Alpha  thalassemia silent carrier - New FOB - kit given 5. Supervision of other normal pregnancy, antepartum  6. Pre-existing type 2 diabetes affecting pregnancy, antepartum - All out of range but improving with insulin  changes  - Will have Delon Rasch discuss adjusting insulin  at appt tomorrow.   7. Pre-existing essential hypertension during pregnancy, antepartum - insulin  aspart (NOVOLOG  FLEXPEN) 100 UNIT/ML FlexPen; Inject 20 Units into the skin 3 (three) times daily with meals.  Dispense: 15 mL; Refill: 12 - Insulin  Pen Needle 31G X 8 MM MISC; 1 Needle by Does not apply route 5 (five) times daily.  Dispense: 100 each; Refill: 12 - Fetal Echo  Preterm labor symptoms and general obstetric precautions including but not limited to vaginal bleeding, contractions, leaking of fluid and fetal movement were reviewed in detail with the patient. Please refer to After Visit Summary for other counseling recommendations.   No follow-ups on file.  Future Appointments  Date Time Provider Department Center  08/06/2023 10:00 AM [facility] PROVIDER 1 [facility]  08/06/2023 10:30 AM [facility] US1 [facility]  08/06/2023  3:50 PM Rasch, [name], NP [facility]  08/20/2023  3:50 PM Rasch, [name], NP [facility]  08/27/2023  3:50 PM Rasch, [name], NP [facility]    Joanne Waller  Claudene CNM [facility] for [facility]

## 2023-08-06 ENCOUNTER — Ambulatory Visit: Attending: Obstetrics and Gynecology

## 2023-08-06 ENCOUNTER — Telehealth: Admitting: Obstetrics and Gynecology

## 2023-08-06 ENCOUNTER — Other Ambulatory Visit: Payer: Self-pay | Admitting: *Deleted

## 2023-08-06 ENCOUNTER — Ambulatory Visit (HOSPITAL_BASED_OUTPATIENT_CLINIC_OR_DEPARTMENT_OTHER): Admitting: Maternal & Fetal Medicine

## 2023-08-06 VITALS — BP 118/78

## 2023-08-06 DIAGNOSIS — Z3A20 20 weeks gestation of pregnancy: Secondary | ICD-10-CM | POA: Insufficient documentation

## 2023-08-06 DIAGNOSIS — O10919 Unspecified pre-existing hypertension complicating pregnancy, unspecified trimester: Secondary | ICD-10-CM | POA: Diagnosis not present

## 2023-08-06 DIAGNOSIS — O24112 Pre-existing diabetes mellitus, type 2, in pregnancy, second trimester: Secondary | ICD-10-CM

## 2023-08-06 DIAGNOSIS — O10912 Unspecified pre-existing hypertension complicating pregnancy, second trimester: Secondary | ICD-10-CM

## 2023-08-06 DIAGNOSIS — O09522 Supervision of elderly multigravida, second trimester: Secondary | ICD-10-CM | POA: Diagnosis not present

## 2023-08-06 DIAGNOSIS — O0990 Supervision of high risk pregnancy, unspecified, unspecified trimester: Secondary | ICD-10-CM | POA: Insufficient documentation

## 2023-08-06 DIAGNOSIS — E669 Obesity, unspecified: Secondary | ICD-10-CM

## 2023-08-06 DIAGNOSIS — O10019 Pre-existing essential hypertension complicating pregnancy, unspecified trimester: Secondary | ICD-10-CM

## 2023-08-06 DIAGNOSIS — Z6839 Body mass index (BMI) 39.0-39.9, adult: Secondary | ICD-10-CM | POA: Diagnosis not present

## 2023-08-06 DIAGNOSIS — O99212 Obesity complicating pregnancy, second trimester: Secondary | ICD-10-CM

## 2023-08-06 DIAGNOSIS — O24119 Pre-existing diabetes mellitus, type 2, in pregnancy, unspecified trimester: Secondary | ICD-10-CM

## 2023-08-06 DIAGNOSIS — O10012 Pre-existing essential hypertension complicating pregnancy, second trimester: Secondary | ICD-10-CM

## 2023-08-06 DIAGNOSIS — E119 Type 2 diabetes mellitus without complications: Secondary | ICD-10-CM | POA: Diagnosis not present

## 2023-08-06 DIAGNOSIS — Z8759 Personal history of other complications of pregnancy, childbirth and the puerperium: Secondary | ICD-10-CM

## 2023-08-06 DIAGNOSIS — Z148 Genetic carrier of other disease: Secondary | ICD-10-CM

## 2023-08-06 MED ORDER — NOVOLOG FLEXPEN 100 UNIT/ML ~~LOC~~ SOPN: 25.0000 [IU] | PEN_INJECTOR | Freq: Three times a day (TID) | SUBCUTANEOUS | 12 refills | Status: DC

## 2023-08-06 MED ORDER — INSULIN GLARGINE-YFGN 100 UNIT/ML ~~LOC~~ SOPN
30.0000 [IU] | PEN_INJECTOR | Freq: Two times a day (BID) | SUBCUTANEOUS | 3 refills | Status: DC
Start: 2023-08-06 — End: 2023-08-27

## 2023-08-06 NOTE — Progress Notes (Signed)
 TELEHEALTH OBSTETRICS VISIT ENCOUNTER NOTE  FOR DIABETES MANAGEMENT DURING PREGNANCY    Provider location: Center for [facility]   Patient location: Home  I connected [email or web address] on 08/06/23 at  3:50 PM EDT by telephone at home and verified that I am speaking with the correct person using two identifiers. Of note, unable to do video encounter due to technical difficulties.    I discussed the limitations, risks, security and privacy concerns of performing an evaluation and management service by telephone and the availability of in person appointments. I also discussed with the patient that there may be a patient responsible charge related to this service. The patient expressed understanding and agreed to proceed.   History:   Joanne Waller is a 38 y.o. H4E7977 at [redacted]w[redacted]d by early ultrasound being seen today for diabetes management during pregnancy. Her obstetrical history is significant for advanced maternal age, excessive weight gain, obesity, and HTN.   Patient reports no complaints. Reports fetal movement. Denies any contractions, bleeding or leaking of fluid.   The following portions of the patient's history were reviewed and updated as appropriate: allergies, current medications, past family history, past medical history, past social history, past surgical history and problem list.   Past Medical History:  Diagnosis Date   Closed avulsion fracture of left ankle 08/14/2019   Depression    Diabetes mellitus without complication (HCC)    Hypertension    Pyelonephritis    Renal disorder    pyelonephritis   Seizures (HCC)    none since age 63   Past Surgical History:  Procedure Laterality Date   CHOLECYSTECTOMY     DILATION AND CURETTAGE OF UTERUS     Family History  Problem Relation Age of Onset   Kidney disease Mother        kidney stones   Hypertension Mother    Sickle cell anemia Father    Cancer Maternal Grandmother    Diabetes Maternal  Grandmother    Hyperlipidemia Maternal Grandmother    Social History   Tobacco Use   Smoking status: Former    Current packs/day: 0.00    Average packs/day: 0.5 packs/day for 7.0 years (3.5 ttl pk-yrs)    Types: Cigarettes    Start date: 06/23/2013    Quit date: 06/23/2020    Years since quitting: 3.1   Smokeless tobacco: Never  Vaping Use   Vaping status: Never Used  Substance Use Topics   Alcohol use: Not Currently    Comment: not while preg   Drug use: No   Allergies  Allergen Reactions   Amoxicillin Other (See Comments)    Pt stated, I get a severe blinding headache   Penicillins Other (See Comments)    Sharp pain in temple    Latex Itching, Swelling and Rash   Current Outpatient Medications on File Prior to Visit  Medication Sig Dispense Refill   ACCU-CHEK GUIDE test strip      Accu-Chek Softclix Lancets lancets daily.     acetaminophen  (TYLENOL ) 325 MG tablet Take 2 tablets (650 mg total) by mouth every 4 (four) hours as needed for moderate pain (for pain scale < 4).     aspirin  81 MG chewable tablet Chew 2 tablets (162 mg total) by mouth daily. 60 tablet 8   Blood Glucose Monitoring Suppl (ACCU-CHEK GUIDE) w/Device KIT      Continuous Glucose Sensor (DEXCOM G7 SENSOR) MISC USE AS DIRECTED 3 each 10   insulin  aspart (NOVOLOG  FLEXPEN)  100 UNIT/ML FlexPen Inject 20 Units into the skin 3 (three) times daily with meals. 15 mL 12   insulin  glargine-yfgn (SEMGLEE ) 100 UNIT/ML Pen Inject 20 Units into the skin every 12 (twelve) hours. 12 mL 3   Insulin  Pen Needle 31G X 8 MM MISC 1 Needle by Does not apply route 5 (five) times daily. 100 each 12   metFORMIN  (GLUCOPHAGE ) 500 MG tablet Take 2 tablets (1,000 mg total) by mouth 2 (two) times daily with a meal. 120 tablet 9   NIFEdipine  (PROCARDIA  XL/NIFEDICAL XL) 60 MG 24 hr tablet Take 1 tablet (60 mg total) by mouth daily. 90 tablet 3   Prenatal Vit-Fe Fumarate-FA (PREPLUS) 27-1 MG TABS Take 1 tablet by mouth daily. 30 tablet 8    [DISCONTINUED] ipratropium (ATROVENT ) 0.06 % nasal spray Place 2 sprays into both nostrils 4 (four) times daily. 15 mL 0   No current facility-administered medications on file prior to visit.    Objective:   General:  Alert, oriented and cooperative.   Mental Status: Normal mood and affect perceived. Normal judgment and thought content.   The Rest of physical exam deferred due to type of encounter  Hospital admissions related to diabetes None recently.    Type 2 DM Diagnosed in 2014   Viability scan: Normal    Anatomy scan:  Normal with EFW 28%tile    Eye exam: Normal in the last year.    A1c at the time of diagnoses. 7.1 %   Most recent HgA1c: 8.3%  Fetal Echo:MFM/  Primary OB to order.    PCP or endocrinologist.     Blood sugar monitoring has CGM however the pharmacy ran out of sensors Planning to place CGM on today.    With CGM, target Blood sugar set at 65-140.   Previously when CGM was in place glucose in target range is 19% in target range    Has Back up glucose monitor; checking her BS only fasting at this time.    Fasting BS 120's   2 hour PP - elevated in the 170's  Overall baseline elevated.     Current Diabetes medications:  Semglee : 20 units in the AM and 20 units in the PM  Novolog : 20 units with all meals, Admits to taking this after meals.   Metformin  1000 mg BID     Plan:   -Continue Metformin  1000 mg BID   -With Dexcom CGM,  BS ranges should be set to 65-140 with a goal of 70% of BS within target range.    -Increase AM/PM Semglee  to 30 units     -Increase all Novolog  with meals to 25 units    -Recommend dosing 15- 20 minutes prior to eating.    -Avoid post meal coverage due hypoglycemia.    -Discussed only 30-40 grams of carbohydrates with each meal, with majority of your meals being protein and high fiber vegetables.    -Start a fiber supplement everyday.    -Protein snack before bed. Recommend 10-20 grams of protein  before bed.    -Anticipate weekly diabetes visits until BS's are better controlled -Anticipate frequent in person OB visits along with MFM care.  -q4 week growth US  starting at 20w with antenatal weekly testing starting at 32 weeks or sooner if needed.    -Continue BASA 162 mg      This visit is for the purposes of diabetes management only. Please keep scheduled OB visits with OB team for prenatal care.  Preterm labor symptoms and general obstetric precautions including but not limited to vaginal bleeding, contractions, leaking of fluid and fetal movement were reviewed in detail with the patient.  I discussed the assessment and treatment plan with the patient. The patient was provided an opportunity to ask questions and all were answered. The patient agreed with the plan and demonstrated an understanding of the instructions. The patient was advised to call back or seek an in-person office evaluation/go to [facility] at [facility] for any urgent or concerning symptoms. Please refer to After Visit Summary for other counseling recommendations.    I provided 20 minutes of non-face-to-face time during this encounter.   Uriah Philipson, [name], NP [facility] [employer], [facility]

## 2023-08-06 NOTE — Patient Instructions (Signed)
 Opt for protein-rich and fiber-rich varieties of pasta. Whole wheat pasta, chickpea pasta, lentil pasta, and edamame spaghetti are good choices due to their higher protein and fiber content, which helps slow down digestion and manage blood sugar levels. It's also important to be mindful of portion sizes and pair pasta with lean proteins and non-starchy vegetables to further balance blood sugar.

## 2023-08-06 NOTE — Progress Notes (Signed)
 Patient information  Patient Name: Joanne Waller  Patient MRN:   [id number]  Referring practice: MFM Referring Provider: [facility])  Problem List   Patient Active Problem List   Diagnosis Date Noted   History of severe pre-eclampsia 06/19/2023   BMI 39.0-39.9,adult 05/21/2023   AMA (advanced maternal age) multigravida 35+ 07/26/2022   Obesity affecting pregnancy 07/26/2022   Supervision of other normal pregnancy, antepartum 05/07/2022   Alpha thalassemia silent carrier 10/14/2020   Pre-existing type 2 diabetes affecting pregnancy, antepartum 08/15/2020   Pre-existing essential hypertension during pregnancy, antepartum 08/15/2020    Maternal Fetal Medicine Consult Joanne Waller is a 38 y.o. H4E7977 at [redacted]w[redacted]d here for ultrasound and consultation. She had Low risk aneuploidy screening of a female fetus. Carrier screening was N/A. Maternal serum AFP was negative. She has no acute concerns.   Today we focused on the following:   Type 2 DM: The patient reports that her blood sugars have been in the 200 and 300 range prior to adjusting her medication.  She is currently taking rapid acting insulin  as well as Semglee .  Her blood sugars are now in the 120s when fasting in the 140-180 range when checking 2 hours after eating.  This is a marked improvement but is not quite at goal.  I discussed the potential clinical complications associated with uncontrolled diabetes in pregnancy.  I also discussed the need for fetal echocardiogram, serial growth ultrasounds and antenatal testing later in the pregnancy.  The patient reports that she has not had any complications associate with her diabetes including abnormalities with her eyes, blood vessels or nerves.  Chronic hypertension The patient reports a history of chronic hypertension starting in 2014 and is treated with nifedipine  outside of pregnancy.  Today her blood pressure is well-controlled on Procardia .  She had late  onset preeclampsia in a previous pregnancy and was compliant with 81 mg of aspirin .  This pregnancy she is taking 162 mg of aspirin .  I discussed the diagnosis, management and impact of chronic hypertension in pregnancy.  I reviewed the various complications such as increased risk of miscarriage, preeclampsia, placental abruption, fetal growth restriction and preterm birth and pregnancy is complicated by hypertensive disorders.  I discussed the importance of monitoring blood pressure and treating any values over 140s systolic or 90 diastolic.  Based on the CHAP trial this improves composite obstetric outcomes and reduces the risk of preeclampsia.  I discussed medications that are safe in pregnancy.  I discussed the importance of frequent ultrasounds and antenatal testing to reduce the risk of stillbirth.  I discussed adjusting the timing of delivery based on the degree of blood pressure control and the presence or absence of preeclampsia with or without severe features.  I recommend following ACOG guidelines for pregnancy complicated by hypertensive disorders. I also briefly discussed the increased risk of cardiovascular disease following pregnancy in women with hypertensive disorders specific to pregnancy as well as the need to follow-up with her primary care provider after delivery for continued surveillance of cardiovascular disease. Sonographic findings Single intrauterine pregnancy at 20w 2d  Fetal cardiac activity:  Observed and appears normal. Presentation: Cephalic. The anatomic structures that were well seen appear normal without evidence of soft markers. Due to poor acoustic windows some structures remain suboptimally visualized. Fetal biometry shows the estimated fetal weight at the 28 percentile.  Amniotic fluid: Within normal limits.  MVP: 3.2 cm. Placenta: Anterior. Adnexa: No abnormality visualized. Cervical length:  3.5 cm.  There are limitations of prenatal ultrasound such as the  inability to detect certain abnormalities due to poor visualization. Various factors such as fetal position, gestational age and maternal body habitus may increase the difficulty in visualizing the fetal anatomy.    Recommendations - EDD should be 12/22/2023 based on  Early Ultrasound  (05/07/23). - Anatomy ultrasound was done today with the above findings (see report). - BP cuff for at home BP monitoring - BP goals: SBP 130-150 and DBP 80-100 mmHg. Adjust antihypertension accordingly. Patients with renal disease may benefit from lower blood pressures during pregnancy with a goal of SBP <140 and DBP < 90. - Baseline labs: CBC (hemoglobin, platelets), CMP (creatinine, ALT, AST), protein/creatinine ratio or 24 hour urine protein. - Antihypertensive treatment: Continue Procardia  30 mg daily. Considering discontinuing if BP is consistently < 130/80. Avoid ACE inhibitors and ARBs due to potential fetopathy. - Fetal echocardiogram ordered - Monitoring for superimposed preeclampsia with BP check and UA at every prenatal visit to assess for proteinuria. - EKG recommended for patients with HTN > 5 year duration to assess for left ventricular hypertrophy. - Maternal echocardiography recommended if EKG is abnormal or patient develops concerning cardiopulmonary symptoms. - Continue 162 mg of aspirin  for preeclampsia risk reduction.- Serial growth sonograms starting at 24-28 weeks every 3-4 weeks. - Continue diabetic care with OB provider.  I  instructed the patient to increase her Semglee  by 2 units every night if her blood sugars remain poorly controlled.  Hypoglycemic precautions given. - Antenatal testing weekly starting at 32 weeks until delivery if antihypertensive therapy is required. - Delivery: likely around 37 weeks given her co-morbidities  Review of Systems: A review of systems was performed and was negative except per HPI   Past Obstetrical History:  OB History  Gravida Para Term Preterm AB  Living  5 2 2  2 2   SAB IAB Ectopic Multiple Live Births  1 1  0 2    # Outcome Date GA Lbr Len/2nd Weight Sex Type Anes PTL Lv  5 Current           4 IAB 2024     SAB     3 Term 02/23/21 [redacted]w[redacted]d 06:45 / 00:08 7 lb 12.5 oz (3.53 kg) M Vag-Spont EPI  LIV     Birth Comments: Facial bruising   2 SAB 10/06/08        DEC  1 Term 03/17/04 [redacted]w[redacted]d  7 lb 5.5 oz (3.33 kg) M Vag-Spont   LIV    Obstetric Comments  2023 Magnesium  for BP     Past Medical History:  Past Medical History:  Diagnosis Date   Closed avulsion fracture of left ankle 08/14/2019   Depression    Diabetes mellitus without complication (HCC)    Hypertension    Pyelonephritis    Renal disorder    pyelonephritis   Seizures (HCC)    none since age 30     Past Surgical History:    Past Surgical History:  Procedure Laterality Date   CHOLECYSTECTOMY     DILATION AND CURETTAGE OF UTERUS       Home Medications:   Current Outpatient Medications on File Prior to Visit  Medication Sig Dispense Refill   ACCU-CHEK GUIDE test strip      acetaminophen  (TYLENOL ) 325 MG tablet Take 2 tablets (650 mg total) by mouth every 4 (four) hours as needed for moderate pain (for pain scale < 4).     aspirin   81 MG chewable tablet Chew 2 tablets (162 mg total) by mouth daily. 60 tablet 8   Blood Glucose Monitoring Suppl (ACCU-CHEK GUIDE) w/Device KIT      Continuous Glucose Sensor (DEXCOM G7 SENSOR) MISC USE AS DIRECTED 3 each 10   insulin  aspart (NOVOLOG  FLEXPEN) 100 UNIT/ML FlexPen Inject 20 Units into the skin 3 (three) times daily with meals. 15 mL 12   Insulin  Pen Needle 31G X 8 MM MISC 1 Needle by Does not apply route 5 (five) times daily. 100 each 12   metFORMIN  (GLUCOPHAGE ) 500 MG tablet Take 2 tablets (1,000 mg total) by mouth 2 (two) times daily with a meal. 120 tablet 9   NIFEdipine  (PROCARDIA  XL/NIFEDICAL XL) 60 MG 24 hr tablet Take 1 tablet (60 mg total) by mouth daily. 90 tablet 3   Prenatal Vit-Fe Fumarate-FA (PREPLUS) 27-1 MG  TABS Take 1 tablet by mouth daily. 30 tablet 8   Accu-Chek Softclix Lancets lancets daily.     insulin  glargine-yfgn (SEMGLEE ) 100 UNIT/ML Pen Inject 20 Units into the skin every 12 (twelve) hours. 12 mL 3   [DISCONTINUED] ipratropium (ATROVENT ) 0.06 % nasal spray Place 2 sprays into both nostrils 4 (four) times daily. 15 mL 0   No current facility-administered medications on file prior to visit.      Allergies:   Allergies  Allergen Reactions   Amoxicillin Other (See Comments)    Pt stated, I get a severe blinding headache   Penicillins Other (See Comments)    Sharp pain in temple    Latex Itching, Swelling and Rash     Physical Exam:   Vitals:   08/06/23 1018  BP: 118/78   Sitting comfortably on the sonogram table Nonlabored breathing Normal rate and rhythm Abdomen is nontender  Thank you for the opportunity to be involved with this patient's care. Please let us  know if we can be of any further assistance.   45 minutes of time was spent reviewing the patient's chart including labs, imaging and documentation.  At least 50% of this time was spent with direct patient care discussing the diagnosis, management and prognosis of her care.  Halli J Mehta MFM, [facility]   08/06/2023  11:24 AM

## 2023-08-07 ENCOUNTER — Inpatient Hospital Stay (HOSPITAL_COMMUNITY)
Admission: AD | Admit: 2023-08-07 | Discharge: 2023-08-08 | Disposition: A | Discharge status: Home / Self Care | Attending: Family Medicine | Admitting: Family Medicine

## 2023-08-07 ENCOUNTER — Encounter: Payer: Self-pay | Admitting: Advanced Practice Midwife

## 2023-08-07 DIAGNOSIS — R109 Unspecified abdominal pain: Secondary | ICD-10-CM | POA: Diagnosis not present

## 2023-08-07 DIAGNOSIS — M549 Dorsalgia, unspecified: Secondary | ICD-10-CM | POA: Diagnosis not present

## 2023-08-07 DIAGNOSIS — O26899 Other specified pregnancy related conditions, unspecified trimester: Secondary | ICD-10-CM

## 2023-08-07 DIAGNOSIS — Z3A20 20 weeks gestation of pregnancy: Secondary | ICD-10-CM | POA: Insufficient documentation

## 2023-08-07 DIAGNOSIS — O26892 Other specified pregnancy related conditions, second trimester: Secondary | ICD-10-CM | POA: Insufficient documentation

## 2023-08-07 DIAGNOSIS — O99891 Other specified diseases and conditions complicating pregnancy: Secondary | ICD-10-CM | POA: Diagnosis not present

## 2023-08-07 LAB — URINALYSIS, ROUTINE W REFLEX MICROSCOPIC
Bilirubin Urine: NEGATIVE
Glucose, UA: NEGATIVE mg/dL
Hgb urine dipstick: NEGATIVE
Ketones, ur: NEGATIVE mg/dL
Leukocytes,Ua: NEGATIVE
Nitrite: NEGATIVE
Protein, ur: 30 mg/dL — AB
Specific Gravity, Urine: 1.026 (ref 1.005–1.030)
pH: 6 (ref 5.0–8.0)

## 2023-08-07 LAB — WET PREP, GENITAL
Clue Cells Wet Prep HPF POC: NONE SEEN
Sperm: NONE SEEN
Trich, Wet Prep: NONE SEEN
WBC, Wet Prep HPF POC: <10 (ref <10)
Yeast Wet Prep HPF POC: NONE SEEN

## 2023-08-07 MED ORDER — IBUPROFEN 800 MG PO TABS
400.0000 mg | ORAL_TABLET | Freq: Once | ORAL | Status: AC
  Administered 2023-08-07: 400 mg via ORAL
  Filled 2023-08-07: qty 1

## 2023-08-07 MED ORDER — CYCLOBENZAPRINE HCL 10 MG PO TABS: 10.0000 mg | ORAL_TABLET | Freq: Two times a day (BID) | ORAL | 0 refills | Status: DC | PRN

## 2023-08-07 NOTE — MAU Provider Note (Signed)
 Chief Complaint:  Abdominal Pain, Back Pain, and Vaginal Discharge   Event Date/Time   First Provider Initiated Contact with Patient 08/07/23 2246     HPI  HPI: Joanne Waller is a 38 y.o. H4E7977 at 47w3dwho presents to maternity admissions reporting cramping in abdomen and back which comes and goes.  Also has vaginal discharge which is yellow and green. . She reports good fetal movement, denies LOF, vaginal bleeding, urinary symptoms, n/v, diarrhea, or fever/chills.    RN Note: .SABRAJasmin J Waller is a 38 y.o. at [redacted]w[redacted]d here in MAU reporting: Mucous discharge yellow-green and has constant lower back pain and intermittent abdominal pain, that comes for 20 minutes and then goes away. Denies vaginal bleeding    Past Medical History: Past Medical History:  Diagnosis Date   Closed avulsion fracture of left ankle 08/14/2019   Depression    Diabetes mellitus without complication (HCC)    Hypertension    Pyelonephritis    Renal disorder    pyelonephritis   Seizures (HCC)    none since age 81    Past obstetric history: OB History  Gravida Para Term Preterm AB Living  5 2 2  2 2   SAB IAB Ectopic Multiple Live Births  1 1  0 2    # Outcome Date GA Lbr Len/2nd Weight Sex Type Anes PTL Lv  5 Current           4 IAB 2024     SAB     3 Term 02/23/21 [redacted]w[redacted]d 06:45 / 00:08 3530 g M Vag-Spont EPI  LIV     Birth Comments: Facial bruising   2 SAB 10/06/08        DEC  1 Term 03/17/04 [redacted]w[redacted]d  3330 g M Vag-Spont   LIV    Obstetric Comments  2023 Magnesium  for BP    Past Surgical History: Past Surgical History:  Procedure Laterality Date   CHOLECYSTECTOMY     DILATION AND CURETTAGE OF UTERUS      Family History: Family History  Problem Relation Age of Onset   Kidney disease Mother        kidney stones   Hypertension Mother    Sickle cell anemia Father    Cancer Maternal Grandmother    Diabetes Maternal Grandmother    Hyperlipidemia Maternal Grandmother     Social History: Social  History   Tobacco Use   Smoking status: Former    Current packs/day: 0.00    Average packs/day: 0.5 packs/day for 7.0 years (3.5 ttl pk-yrs)    Types: Cigarettes    Start date: 06/23/2013    Quit date: 06/23/2020    Years since quitting: 3.1   Smokeless tobacco: Never  Vaping Use   Vaping status: Never Used  Substance Use Topics   Alcohol use: Not Currently    Comment: not while preg   Drug use: No    Allergies:  Allergies  Allergen Reactions   Amoxicillin Other (See Comments)    Pt stated, I get a severe blinding headache   Penicillins Other (See Comments)    Sharp pain in temple    Latex Itching, Swelling and Rash    Meds:  Medications Prior to Admission  Medication Sig Dispense Refill Last Dose/Taking   ACCU-CHEK GUIDE test strip       Accu-Chek Softclix Lancets lancets daily.      acetaminophen  (TYLENOL ) 325 MG tablet Take 2 tablets (650 mg total) by mouth every 4 (four) hours as  needed for moderate pain (for pain scale < 4).      aspirin  81 MG chewable tablet Chew 2 tablets (162 mg total) by mouth daily. 60 tablet 8    Blood Glucose Monitoring Suppl (ACCU-CHEK GUIDE) w/Device KIT       Continuous Glucose Sensor (DEXCOM G7 SENSOR) MISC USE AS DIRECTED 3 each 10    insulin  aspart (NOVOLOG  FLEXPEN) 100 UNIT/ML FlexPen Inject 25 Units into the skin 3 (three) times daily with meals. 15 mL 12    insulin  glargine-yfgn (SEMGLEE ) 100 UNIT/ML Pen Inject 30 Units into the skin every 12 (twelve) hours. 18 mL 3    Insulin  Pen Needle 31G X 8 MM MISC 1 Needle by Does not apply route 5 (five) times daily. 100 each 12    metFORMIN  (GLUCOPHAGE ) 500 MG tablet Take 2 tablets (1,000 mg total) by mouth 2 (two) times daily with a meal. 120 tablet 9    NIFEdipine  (PROCARDIA  XL/NIFEDICAL XL) 60 MG 24 hr tablet Take 1 tablet (60 mg total) by mouth daily. 90 tablet 3    Prenatal Vit-Fe Fumarate-FA (PREPLUS) 27-1 MG TABS Take 1 tablet by mouth daily. 30 tablet 8     I have reviewed patient's  Past Medical Hx, Surgical Hx, Family Hx, Social Hx, medications and allergies.   ROS:  Review of Systems  Constitutional:  Negative for chills and fever.  Respiratory:  Negative for shortness of breath.   Gastrointestinal:  Positive for abdominal pain. Negative for diarrhea, nausea and vomiting.  Genitourinary:  Negative for vaginal bleeding.  Musculoskeletal:  Positive for back pain.   Other systems negative  Physical Exam  Patient Vitals for the past 24 hrs:  BP Temp Temp src Pulse Resp SpO2 Height Weight  08/07/23 2024 137/89 97.9 F (36.6 C) Oral (!) 104 18 100 % 5' 10.5 (1.791 m) 129 kg   Constitutional: Well-developed, well-nourished female in no acute distress.  Cardiovascular: normal rate  Respiratory: normal effort GI: Abd soft, non-tender, gravid appropriate for gestational age.   No rebound or guarding. MS: Extremities nontender, no edema, normal ROM Neurologic: Alert and oriented x 4.   PELVIC EXAM:   Dilation: Closed Effacement (%): Thick Exam by:: Earnie Pouch, CNM  FHT:  140   Labs: Results for orders placed or performed during the hospital encounter of 08/07/23 (from the past 24 hours)  Urinalysis, Routine w reflex microscopic -Urine, Clean Catch     Status: Abnormal   Collection Time: 08/07/23  8:22 PM  Result Value Ref Range   Color, Urine YELLOW YELLOW   APPearance HAZY (A) CLEAR   Specific Gravity, Urine 1.026 1.005 - 1.030   pH 6.0 5.0 - 8.0   Glucose, UA NEGATIVE NEGATIVE mg/dL   Hgb urine dipstick NEGATIVE NEGATIVE   Bilirubin Urine NEGATIVE NEGATIVE   Ketones, ur NEGATIVE NEGATIVE mg/dL   Protein, ur 30 (A) NEGATIVE mg/dL   Nitrite NEGATIVE NEGATIVE   Leukocytes,Ua NEGATIVE NEGATIVE   RBC / HPF 0-5 0 - 5 RBC/hpf   WBC, UA 0-5 0 - 5 WBC/hpf   Bacteria, UA RARE (A) NONE SEEN   Squamous Epithelial / HPF 11-20 0 - 5 /HPF   Mucus PRESENT   Wet prep, genital     Status: None   Collection Time: 08/07/23  8:35 PM  Result Value Ref Range    Yeast Wet Prep HPF POC NONE SEEN NONE SEEN   Trich, Wet Prep NONE SEEN NONE SEEN   Clue Cells Wet Prep  HPF POC NONE SEEN NONE SEEN   WBC, Wet Prep HPF POC <10 <10   Sperm NONE SEEN    B/Positive/-- (04/23 1519)  Imaging:  none  MAU Course/MDM: I have reviewed the triage vital signs and the nursing notes.   Pertinent labs & imaging results that were available during my care of the patient were reviewed by me and considered in my medical decision making (see chart for details).      I have reviewed her medical records including past results, notes and treatments.   I have ordered labs and reviewed results. UA is clear.  Wet prep unremarkable.  Treatments in MAU included single dose of ibuprofen  400mg  which stopped her uterine cramping. .    Assessment: Single IUP at 104w4d Uterine cramping without cervical change Vaginal discharge, likely physiologic  Plan: Discharge home Preterm Labor precautions and fetal kick counts Rx Flexeril  for back pain prn. Follow up in Office for prenatal visits and recheck  Pt stable at time of discharge.  Earnie Pouch CNM, MSN Certified Nurse-Midwife 08/07/2023 10:46 PM

## 2023-08-07 NOTE — MAU Note (Addendum)
..  Joanne Waller is a 38 y.o. at [redacted]w[redacted]d here in MAU reporting: Mucous discharge yellow-green and has constant lower back pain and intermittent abdominal pain, that comes for 20 minutes and then goes away. Denies vaginal bleeding    Pain score: back 6/10; abdominal 6/10 Vitals:   08/07/23 2024  BP: 137/89  Pulse: (!) 104  Resp: 18  Temp: 97.9 F (36.6 C)  SpO2: 100%     FHT: 140 Lab orders placed from triage:  UA

## 2023-08-08 DIAGNOSIS — R109 Unspecified abdominal pain: Secondary | ICD-10-CM

## 2023-08-08 DIAGNOSIS — Z3A20 20 weeks gestation of pregnancy: Secondary | ICD-10-CM | POA: Diagnosis not present

## 2023-08-08 DIAGNOSIS — O26892 Other specified pregnancy related conditions, second trimester: Secondary | ICD-10-CM | POA: Diagnosis not present

## 2023-08-08 DIAGNOSIS — M549 Dorsalgia, unspecified: Secondary | ICD-10-CM | POA: Diagnosis not present

## 2023-08-08 DIAGNOSIS — O99891 Other specified diseases and conditions complicating pregnancy: Secondary | ICD-10-CM | POA: Diagnosis not present

## 2023-08-08 LAB — GC/CHLAMYDIA PROBE AMP (~~LOC~~) NOT AT ARMC
Chlamydia: NEGATIVE
Comment: NEGATIVE
Comment: NORMAL
Neisseria Gonorrhea: NEGATIVE

## 2023-08-14 ENCOUNTER — Telehealth: Payer: Self-pay | Admitting: *Deleted

## 2023-08-14 DIAGNOSIS — O09529 Supervision of elderly multigravida, unspecified trimester: Secondary | ICD-10-CM

## 2023-08-14 DIAGNOSIS — Z3480 Encounter for supervision of other normal pregnancy, unspecified trimester: Secondary | ICD-10-CM

## 2023-08-14 DIAGNOSIS — O99210 Obesity complicating pregnancy, unspecified trimester: Secondary | ICD-10-CM

## 2023-08-14 DIAGNOSIS — O10919 Unspecified pre-existing hypertension complicating pregnancy, unspecified trimester: Secondary | ICD-10-CM

## 2023-08-14 DIAGNOSIS — O24119 Pre-existing diabetes mellitus, type 2, in pregnancy, unspecified trimester: Secondary | ICD-10-CM

## 2023-08-14 NOTE — Telephone Encounter (Signed)
 I called [facility] ( Dr. Filbert) to schedule and was told to fax in referral including demographics, insurance, last few office visit notes, and reason for referral ; then they will contact patient wit appointment. Message sent to front office to send asap. Zayed Griffie,RN

## 2023-08-14 NOTE — Telephone Encounter (Signed)
 Received message from [place]  Claudene patient needs fetal echo.

## 2023-08-15 ENCOUNTER — Inpatient Hospital Stay (HOSPITAL_COMMUNITY)
Admission: AD | Admit: 2023-08-15 | Discharge: 2023-08-15 | Disposition: A | Discharge status: Home / Self Care | Payer: Self-pay | Attending: Obstetrics & Gynecology | Admitting: Obstetrics & Gynecology

## 2023-08-15 DIAGNOSIS — Z3A21 21 weeks gestation of pregnancy: Secondary | ICD-10-CM | POA: Diagnosis not present

## 2023-08-15 DIAGNOSIS — R109 Unspecified abdominal pain: Secondary | ICD-10-CM | POA: Diagnosis not present

## 2023-08-15 DIAGNOSIS — O368120 Decreased fetal movements, second trimester, not applicable or unspecified: Secondary | ICD-10-CM | POA: Diagnosis not present

## 2023-08-15 NOTE — MAU Provider Note (Signed)
   S [name] is a 38 y.o. [id number] patient who presents to MAU today with complaint of DFM at [redacted]w[redacted]d . She reports she hasn't felt the baby move today and has been feeling Fetal movements since 18 weeks. Offers no other OB c/o Denies LOF, VB, Abdominal pain or cramping. FHT via Doppler in Triage was 148 and patient felt reassured     O BP 123/73   Pulse 92   Temp 98.2 F (36.8 C)   Resp 18   Ht 5' 10.5 (1.791 m)   Wt 130.6 kg   LMP 03/11/2023 (Exact Date)   BMI 40.74 kg/m  Physical Exam Vitals and nursing note reviewed. Exam conducted with a chaperone present.  Constitutional:      General: She is not in acute distress.    Appearance: Normal appearance. She is obese. She is not ill-appearing.  HENT:     Head: Normocephalic.     Nose: Nose normal.     Mouth/Throat:     Mouth: Mucous membranes are moist.  Cardiovascular:     Rate and Rhythm: Normal rate.  Pulmonary:     Effort: Pulmonary effort is normal.  Abdominal:     Palpations: Abdomen is soft.  Musculoskeletal:        General: Normal range of motion.     Cervical back: Normal range of motion.  Skin:    General: Skin is warm.  Neurological:     Mental Status: She is alert and oriented to person, place, and time.  Psychiatric:        Mood and Affect: Mood normal.        Behavior: Behavior normal.    FHT: 148 by Doppler   MDM  MODERATE   Maternal reassurance provided and patient acknowledges fetal movements while here in MAU. Counseling provided and all questions answered to her satisfaction  I have reviewed the patient chart and performed the physical exam . A/P as described below.  Counseling and education provided and patient agreeable  with plan as described below. Verbalized understanding.    ASSESSMENT Medical screening exam complete  1. Decreased fetal movements in second trimester, single or unspecified fetus (Primary)  2. [redacted] weeks gestation of pregnancy     PLAN Future Appointments   Date Time Provider Department Center  08/20/2023  3:50 PM Rasch, [name], NP [facility]  08/27/2023  3:50 PM Rasch, [name], NP [facility]  09/03/2023  3:00 PM [facility] PROVIDER 1 [facility]  09/03/2023  3:45 PM [facility] US3 [facility]  09/04/2023  3:55 PM Izell Harari, MD [facility]  10/01/2023  3:00 PM [facility] PROVIDER 1 [facility]  10/01/2023  3:45 PM [facility] US1 [facility]  10/29/2023  1:00 PM [facility] PROVIDER 1 [facility]  10/29/2023  1:30 PM [facility] US3 [facility]    Discharge from MAU in stable condition  See AVS for full description of educational information and instructions provided to the patient at time of discharge  Warning signs for worsening condition that would warrant emergency follow-up discussed Patient may return to MAU as needed   Littie Olam LABOR, NP 08/15/2023 4:51 PM

## 2023-08-15 NOTE — MAU Note (Signed)
 Joanne Waller is a 38 y.o. at [redacted]w[redacted]d here in MAU reporting: had not feel baby move since yesterday. Has been having constant fetal movement since 18 weeks. Reports she has been having some abd pain on and off.   LMP:  Onset of complaint: 2 days Pain score: 0 Vitals:   08/15/23 1537  BP: 123/73  Pulse: 92  Resp: 18  Temp: 98.2 F (36.8 C)     FHT: 148  Lab orders placed from triage:

## 2023-08-15 NOTE — MAU Note (Signed)
 FHR 148 with doppler . Pt reassured that t baby is ok. Want to go hoe. Had L.Cooleen,NP MSE pt and discharged her home.

## 2023-08-20 ENCOUNTER — Encounter: Admitting: Obstetrics and Gynecology

## 2023-08-27 ENCOUNTER — Other Ambulatory Visit: Payer: Self-pay

## 2023-08-27 ENCOUNTER — Telehealth (INDEPENDENT_AMBULATORY_CARE_PROVIDER_SITE_OTHER): Admitting: Obstetrics and Gynecology

## 2023-08-27 DIAGNOSIS — O10012 Pre-existing essential hypertension complicating pregnancy, second trimester: Secondary | ICD-10-CM | POA: Diagnosis not present

## 2023-08-27 DIAGNOSIS — O24119 Pre-existing diabetes mellitus, type 2, in pregnancy, unspecified trimester: Secondary | ICD-10-CM | POA: Diagnosis not present

## 2023-08-27 DIAGNOSIS — O10019 Pre-existing essential hypertension complicating pregnancy, unspecified trimester: Secondary | ICD-10-CM

## 2023-08-27 MED ORDER — GLUCOSE BLOOD VI STRP: ORAL_STRIP | 2 refills | Status: AC

## 2023-08-27 MED ORDER — NOVOLOG FLEXPEN 100 UNIT/ML ~~LOC~~ SOPN: 25.0000 [IU] | PEN_INJECTOR | Freq: Three times a day (TID) | SUBCUTANEOUS | 12 refills | Status: DC

## 2023-08-27 MED ORDER — ACCU-CHEK SOFTCLIX LANCETS MISC: 1.0000 | Freq: Four times a day (QID) | 2 refills | Status: AC

## 2023-08-27 MED ORDER — ACCU-CHEK GUIDE W/DEVICE KIT: 1.0000 | PACK | 0 refills | Status: AC

## 2023-08-27 MED ORDER — INSULIN GLARGINE-YFGN 100 UNIT/ML ~~LOC~~ SOPN: 30.0000 [IU] | PEN_INJECTOR | Freq: Two times a day (BID) | SUBCUTANEOUS | 3 refills | Status: DC

## 2023-08-27 NOTE — Progress Notes (Signed)
 Per provider request, verbal orders given for Diabetic testing supplies, ordered and sent to pharmacy.  Silvano LELON Piano, RN

## 2023-08-27 NOTE — Progress Notes (Signed)
 TELEHEALTH OBSTETRICS VISIT ENCOUNTER NOTE  FOR DIABETES MANAGEMENT DURING PREGNANCY    Provider location: [facility] at [facility]   Patient location: Home  I connected withNAME@ on 08/27/23 at  3:50 PM EDT by telephone at home and verified that I am speaking with the correct person using two identifiers. Of note, unable to do video encounter due to technical difficulties.    I discussed the limitations, risks, security and privacy concerns of performing an evaluation and management service by telephone and the availability of in person appointments. I also discussed with the patient that there may be a patient responsible charge related to this service. The patient expressed understanding and agreed to proceed.   History:   Joanne Waller is a 38 y.o. H4E7977 at [redacted]w[redacted]d by LMP being seen today for diabetes management during pregnancy.   Patient reports no complaints. Reports fetal movement. Denies any contractions, bleeding or leaking of fluid.   The following portions of the patient's history were reviewed and updated as appropriate: allergies, current medications, past family history, past medical history, past social history, past surgical history and problem list.   Past Medical History:  Diagnosis Date   Closed avulsion fracture of left ankle 08/14/2019   Depression    Diabetes mellitus without complication (HCC)    Hypertension    Pyelonephritis    Renal disorder    pyelonephritis   Seizures (HCC)    none since age 72   Past Surgical History:  Procedure Laterality Date   CHOLECYSTECTOMY     DILATION AND CURETTAGE OF UTERUS     Family History  Problem Relation Age of Onset   Kidney disease Mother        kidney stones   Hypertension Mother    Sickle cell anemia Father    Cancer Maternal Grandmother    Diabetes Maternal Grandmother    Hyperlipidemia Maternal Grandmother    Social History   Tobacco Use   Smoking status: Former    Current  packs/day: 0.00    Average packs/day: 0.5 packs/day for 7.0 years (3.5 ttl pk-yrs)    Types: Cigarettes    Start date: 06/23/2013    Quit date: 06/23/2020    Years since quitting: 3.1   Smokeless tobacco: Never  Vaping Use   Vaping status: Never Used  Substance Use Topics   Alcohol use: Not Currently    Comment: not while preg   Drug use: No   Allergies  Allergen Reactions   Amoxicillin Other (See Comments)    Pt stated, I get a severe blinding headache   Penicillins Other (See Comments)    Sharp pain in temple    Latex Itching, Swelling and Rash   Current Outpatient Medications on File Prior to Visit  Medication Sig Dispense Refill   ACCU-CHEK GUIDE test strip      Accu-Chek Softclix Lancets lancets daily.     acetaminophen  (TYLENOL ) 325 MG tablet Take 2 tablets (650 mg total) by mouth every 4 (four) hours as needed for moderate pain (for pain scale < 4).     aspirin  81 MG chewable tablet Chew 2 tablets (162 mg total) by mouth daily. 60 tablet 8   Blood Glucose Monitoring Suppl (ACCU-CHEK GUIDE) w/Device KIT      Continuous Glucose Sensor (DEXCOM G7 SENSOR) MISC USE AS DIRECTED 3 each 10   cyclobenzaprine  (FLEXERIL ) 10 MG tablet Take 1 tablet (10 mg total) by mouth 2 (two) times daily as needed for  muscle spasms (back pain). 20 tablet 0   insulin  aspart (NOVOLOG  FLEXPEN) 100 UNIT/ML FlexPen Inject 25 Units into the skin 3 (three) times daily with meals. 15 mL 12   insulin  glargine-yfgn (SEMGLEE ) 100 UNIT/ML Pen Inject 30 Units into the skin every 12 (twelve) hours. 18 mL 3   Insulin  Pen Needle 31G X 8 MM MISC 1 Needle by Does not apply route 5 (five) times daily. 100 each 12   metFORMIN  (GLUCOPHAGE ) 500 MG tablet Take 2 tablets (1,000 mg total) by mouth 2 (two) times daily with a meal. 120 tablet 9   NIFEdipine  (PROCARDIA  XL/NIFEDICAL XL) 60 MG 24 hr tablet Take 1 tablet (60 mg total) by mouth daily. 90 tablet 3   Prenatal Vit-Fe Fumarate-FA (PREPLUS) 27-1 MG TABS Take 1 tablet  by mouth daily. 30 tablet 8   [DISCONTINUED] ipratropium (ATROVENT ) 0.06 % nasal spray Place 2 sprays into both nostrils 4 (four) times daily. 15 mL 0   No current facility-administered medications on file prior to visit.      Objective:   General:  Alert, oriented and cooperative.   Mental Status: Normal mood and affect perceived. Normal judgment and thought content.   The Rest of physical exam deferred due to type of encounter    Type 2 DM  Diagnosed in 2014   Viability scan Normal    Anatomy scan reassuring, EFW 28%tile   A1c at the time of diagnoses. 7.1%   Most recent HgA1c: 8.3%  Fetal Echo ordered.    Blood sugar monitoring: Dexcom has been on back order at pharmacy for 2 weeks.  Was able to pick up Dexcom today and has applied it. Data is pending.  Switched pharmacy to [facility].    With CGM, target Blood sugar set at 65-140.Blood sugar values unknown.    Back up glucose monitor: does not have glucometer for back up . Will call in monitor and supplies for back up today.     Fasting BS - no values    2 hour PP - no values.      Current Diabetes medications:   Continue Semglee  30 units AM/PM Continue Metformin  1000 mg BID Continue Novolog  25 units with all meals.   Plan:  -Continue Semglee  30 units AM/PM -Continue Metformin  1000 mg BID -Continue Novolog  25 units with all meals.  -Recommend dosing 15- 20 minutes prior to eating.    -Avoid post meal coverage due hypoglycemia.    -Discussed only 30-40 grams of carbohydrates with each meal, with majority of your meals being protein and high fiber vegetables.    -Start a fiber supplement everyday.    -Protein snack before bed. Recommend 10-20 grams of protein before bed.    -Anticipate weekly diabetes visits until BS's are better controlled -Anticipate frequent in person OB visits along with MFM care.  -q4 week growth US  starting at 20w with antenatal weekly testing starting at 32  weeks or sooner if needed.    -Continue BASA 81 mg    -Will continue close follow up for diabetes management.     This visit is for the purposes of diabetes management only. Please keep scheduled OB visits with OB team for prenatal care.    Preterm labor symptoms and general obstetric precautions including but not limited to vaginal bleeding, contractions, leaking of fluid and fetal movement were reviewed in detail with the patient.  I discussed the assessment and treatment plan with the patient. The patient was provided an opportunity  to ask questions and all were answered. The patient agreed with the plan and demonstrated an understanding of the instructions. The patient was advised to call back or seek an in-person office evaluation/go to MAU at [facility] for any urgent or concerning symptoms. Please refer to After Visit Summary for other counseling recommendations.    I provided 15 minutes of non-face-to-face time during this encounter.   Zylpha Poynor, [name], NP [facility] [employer], [facility]

## 2023-08-29 ENCOUNTER — Telehealth: Payer: Self-pay | Admitting: *Deleted

## 2023-08-29 NOTE — Telephone Encounter (Signed)
 I called Dr. Nora office to follow up if they received demographics and scheduled fetal echo. They informed they contacted patient and scheduled appt 09/04/23 10am. Rock Skip PEAK

## 2023-09-01 ENCOUNTER — Encounter: Payer: Self-pay | Admitting: Obstetrics and Gynecology

## 2023-09-02 MED ORDER — TERCONAZOLE 0.4 % VA CREA: 1.0000 | TOPICAL_CREAM | Freq: Every day | VAGINAL | 0 refills | Status: DC

## 2023-09-03 ENCOUNTER — Ambulatory Visit: Attending: Maternal & Fetal Medicine | Admitting: Obstetrics

## 2023-09-03 ENCOUNTER — Ambulatory Visit

## 2023-09-03 VITALS — BP 114/67 | HR 108

## 2023-09-03 DIAGNOSIS — O24112 Pre-existing diabetes mellitus, type 2, in pregnancy, second trimester: Secondary | ICD-10-CM | POA: Diagnosis not present

## 2023-09-03 DIAGNOSIS — O10912 Unspecified pre-existing hypertension complicating pregnancy, second trimester: Secondary | ICD-10-CM

## 2023-09-03 DIAGNOSIS — O09522 Supervision of elderly multigravida, second trimester: Secondary | ICD-10-CM

## 2023-09-03 DIAGNOSIS — O09292 Supervision of pregnancy with other poor reproductive or obstetric history, second trimester: Secondary | ICD-10-CM | POA: Insufficient documentation

## 2023-09-03 DIAGNOSIS — E669 Obesity, unspecified: Secondary | ICD-10-CM

## 2023-09-03 DIAGNOSIS — O99212 Obesity complicating pregnancy, second trimester: Secondary | ICD-10-CM | POA: Diagnosis not present

## 2023-09-03 DIAGNOSIS — Z6839 Body mass index (BMI) 39.0-39.9, adult: Secondary | ICD-10-CM

## 2023-09-03 DIAGNOSIS — Z3A24 24 weeks gestation of pregnancy: Secondary | ICD-10-CM

## 2023-09-03 DIAGNOSIS — O10112 Pre-existing hypertensive heart disease complicating pregnancy, second trimester: Secondary | ICD-10-CM | POA: Diagnosis not present

## 2023-09-03 DIAGNOSIS — O10012 Pre-existing essential hypertension complicating pregnancy, second trimester: Secondary | ICD-10-CM

## 2023-09-03 DIAGNOSIS — Z148 Genetic carrier of other disease: Secondary | ICD-10-CM | POA: Diagnosis not present

## 2023-09-03 DIAGNOSIS — Z363 Encounter for antenatal screening for malformations: Secondary | ICD-10-CM | POA: Insufficient documentation

## 2023-09-03 DIAGNOSIS — Z794 Long term (current) use of insulin: Secondary | ICD-10-CM

## 2023-09-03 DIAGNOSIS — O2692 Pregnancy related conditions, unspecified, second trimester: Secondary | ICD-10-CM | POA: Diagnosis not present

## 2023-09-03 DIAGNOSIS — E119 Type 2 diabetes mellitus without complications: Secondary | ICD-10-CM

## 2023-09-03 DIAGNOSIS — Z7984 Long term (current) use of oral hypoglycemic drugs: Secondary | ICD-10-CM

## 2023-09-03 DIAGNOSIS — Z8759 Personal history of other complications of pregnancy, childbirth and the puerperium: Secondary | ICD-10-CM

## 2023-09-03 NOTE — Progress Notes (Signed)
 MFM Consult Note  Joanne Waller is currently at 24 weeks and 2 days.  She has been followed due to advanced maternal age (38 years old), maternal obesity with a BMI of 39, pregestational diabetes treated with insulin  and metformin , and chronic hypertension treated with Procardia .    She denies any problems since her last exam.  Her blood pressure today was 114/67.    She has a fetal echocardiogram scheduled with [facility] pediatric cardiology tomorrow morning.  On today's exam, the overall EFW of 1 pound 5 ounces measures at the 14th percentile for her gestational age.    There was normal amniotic fluid noted.  The patient was advised to continue taking insulin  and metformin  to keep her blood sugars under the best control possible.    She was also advised to continue taking Procardia  for treatment of her high blood pressure.  Due to her underlying medical conditions, we will continue to follow her with monthly growth ultrasounds.    Weekly fetal testing should be started at around 32 weeks.    The goal for her delivery would be at between 37 to 38 weeks.    She will return in 4 weeks for another growth scan.    The patient stated that all of her questions were answered today.  A total of 20 minutes was spent counseling and coordinating the care for this patient.  Greater than 50% of the time was spent in direct face-to-face contact.

## 2023-09-04 ENCOUNTER — Other Ambulatory Visit: Payer: Self-pay

## 2023-09-04 ENCOUNTER — Telehealth: Payer: Self-pay | Admitting: Lactation Services

## 2023-09-04 ENCOUNTER — Ambulatory Visit (INDEPENDENT_AMBULATORY_CARE_PROVIDER_SITE_OTHER): Admitting: Obstetrics and Gynecology

## 2023-09-04 ENCOUNTER — Other Ambulatory Visit (HOSPITAL_COMMUNITY): Payer: Self-pay

## 2023-09-04 VITALS — BP 113/78 | HR 105 | Wt 291.0 lb

## 2023-09-04 DIAGNOSIS — O24112 Pre-existing diabetes mellitus, type 2, in pregnancy, second trimester: Secondary | ICD-10-CM

## 2023-09-04 DIAGNOSIS — Z8759 Personal history of other complications of pregnancy, childbirth and the puerperium: Secondary | ICD-10-CM

## 2023-09-04 DIAGNOSIS — Z6839 Body mass index (BMI) 39.0-39.9, adult: Secondary | ICD-10-CM

## 2023-09-04 DIAGNOSIS — O10012 Pre-existing essential hypertension complicating pregnancy, second trimester: Secondary | ICD-10-CM

## 2023-09-04 DIAGNOSIS — O24119 Pre-existing diabetes mellitus, type 2, in pregnancy, unspecified trimester: Secondary | ICD-10-CM

## 2023-09-04 DIAGNOSIS — O10019 Pre-existing essential hypertension complicating pregnancy, unspecified trimester: Secondary | ICD-10-CM

## 2023-09-04 DIAGNOSIS — O10919 Unspecified pre-existing hypertension complicating pregnancy, unspecified trimester: Secondary | ICD-10-CM

## 2023-09-04 DIAGNOSIS — O99212 Obesity complicating pregnancy, second trimester: Secondary | ICD-10-CM

## 2023-09-04 DIAGNOSIS — O10912 Unspecified pre-existing hypertension complicating pregnancy, second trimester: Secondary | ICD-10-CM | POA: Diagnosis not present

## 2023-09-04 DIAGNOSIS — Z3A24 24 weeks gestation of pregnancy: Secondary | ICD-10-CM

## 2023-09-04 DIAGNOSIS — O09522 Supervision of elderly multigravida, second trimester: Secondary | ICD-10-CM

## 2023-09-04 DIAGNOSIS — O365920 Maternal care for other known or suspected poor fetal growth, second trimester, not applicable or unspecified: Secondary | ICD-10-CM | POA: Insufficient documentation

## 2023-09-04 MED ORDER — INSULIN GLARGINE-YFGN 100 UNIT/ML ~~LOC~~ SOPN
PEN_INJECTOR | SUBCUTANEOUS | 6 refills | Status: DC
Start: 2023-09-04 — End: 2023-11-19
  Filled 2023-09-04: qty 24, 30d supply, fill #0
  Filled 2023-10-23: qty 30, 37d supply, fill #0

## 2023-09-04 MED ORDER — INSULIN GLARGINE-YFGN 100 UNIT/ML ~~LOC~~ SOPN: PEN_INJECTOR | SUBCUTANEOUS | Status: DC

## 2023-09-04 MED ORDER — NOVOLOG FLEXPEN 100 UNIT/ML ~~LOC~~ SOPN: 30.0000 [IU] | PEN_INJECTOR | Freq: Three times a day (TID) | SUBCUTANEOUS | Status: DC

## 2023-09-04 NOTE — Telephone Encounter (Signed)
 Semglee  not available at [facility] due to back order. Called [facility] and they have it in. Sent order to [facility] until [facility] has back in stock.   Patient taking 80 ml per day= 2400 units/30 days. Each box of 5 pens contains 1500 units, 2 boxes ordered.   Called patient to inform her of changes. She did not answer. LM that had to change location of where to pick up one of her Insulins due to backorder. LM to check her Mychart message and call if she has any questions or concerns.

## 2023-09-04 NOTE — Progress Notes (Unsigned)
 Patient complains of braxton hicks

## 2023-09-04 NOTE — Progress Notes (Unsigned)
   PRENATAL VISIT NOTE  Subjective:  Joanne Waller is a 38 y.o. H4E7977 at [redacted]w[redacted]d being seen today for ongoing prenatal care.  She is currently monitored for the following issues for this {Blank single:19197::high-risk,low-risk} pregnancy and has Pre-existing type 2 diabetes affecting pregnancy, antepartum; Chronic hypertension during pregnancy, antepartum; Alpha thalassemia silent carrier; Supervision of other normal pregnancy, antepartum; AMA (advanced maternal age) multigravida 35+; Obesity affecting pregnancy; BMI 39.0-39.9,adult; History of severe pre-eclampsia; and Poor fetal growth affecting management of mother in second trimester on their problem list.  Patient reports {sx:14538}.  Contractions: Irritability. Vag. Bleeding: None.  Movement: Present. Denies leaking of fluid.   The following portions of the patient's history were reviewed and updated as appropriate: allergies, current medications, past family history, past medical history, past social history, past surgical history and problem list.   Objective:    Vitals:   09/04/23 1604  BP: 113/78  Pulse: (!) 105  Weight: 291 lb (132 kg)    Fetal Status:  Fetal Heart Rate (bpm): 160   Movement: Present    General: Alert, oriented and cooperative. Patient is in no acute distress.  Skin: Skin is warm and dry. No rash noted.   Cardiovascular: Normal heart rate noted  Respiratory: Normal respiratory effort, no problems with respiration noted  Abdomen: Soft, gravid, appropriate for gestational age.  Pain/Pressure: Absent     Pelvic: {Blank single:19197::Cervical exam performed in the presence of a chaperone,Cervical exam deferred}        Extremities: Normal range of motion.     Mental Status: Normal mood and affect. Normal behavior. Normal judgment and thought content.   Assessment and Plan:  Pregnancy: H4E7977 at [redacted]w[redacted]d 1. Pre-existing type 2 diabetes affecting pregnancy, antepartum (Primary) *** - insulin  glargine-yfgn  (SEMGLEE ) 100 UNIT/ML Pen; 45 units with breakfast and 35 units before bed  2. Pre-existing essential hypertension during pregnancy, antepartum *** - insulin  aspart (NOVOLOG  FLEXPEN) 100 UNIT/ML FlexPen; Inject 30 Units into the skin 3 (three) times daily with meals.  3. Chronic hypertension during pregnancy, antepartum ***  4. History of severe pre-eclampsia ***  5. Obesity affecting pregnancy in second trimester, unspecified obesity type ***  6. BMI 39.0-39.9,adult ***  7. Multigravida of advanced maternal age in second trimester ***  8. Poor fetal growth affecting management of mother in second trimester, single or unspecified fetus ***  {Blank single:19197::Term,Preterm} labor symptoms and general obstetric precautions including but not limited to vaginal bleeding, contractions, leaking of fluid and fetal movement were reviewed in detail with the patient. Please refer to After Visit Summary for other counseling recommendations.   No follow-ups on file.  Future Appointments  Date Time Provider Department Center  09/10/2023  2:50 PM Rasch, [name], NP [facility]  09/17/2023  2:10 PM Rasch, [name], NP [facility]  10/01/2023  3:30 PM [facility] PROVIDER 1 [facility]  10/01/2023  3:45 PM [facility] US1 [facility]  10/29/2023  1:15 PM [facility] PROVIDER 1 [facility]  10/29/2023  1:30 PM [facility] US3 [facility]    Bebe Furry, MD

## 2023-09-05 ENCOUNTER — Other Ambulatory Visit (HOSPITAL_COMMUNITY): Payer: Self-pay

## 2023-09-10 ENCOUNTER — Telehealth: Payer: Self-pay

## 2023-09-10 ENCOUNTER — Encounter: Admitting: Obstetrics and Gynecology

## 2023-09-10 NOTE — Telephone Encounter (Addendum)
 RN attempted to call patient to see if could connect to virtual appointment, left HIPAA compliant voicemail to return RN call.  Silvano LELON Piano, RN   2nd attempt to call patient to request to connect to virtual appointment.  Silvano LELON Piano, RN

## 2023-09-17 ENCOUNTER — Telehealth: Payer: Self-pay

## 2023-09-17 ENCOUNTER — Encounter: Admitting: Obstetrics and Gynecology

## 2023-09-17 NOTE — Telephone Encounter (Signed)
 Called pt to get her ready for Virtual Visit and left a Voicemail. I also sent patient a MyChart message with the link to join her virtual visit.  Delon Maiden, CMA

## 2023-09-26 ENCOUNTER — Encounter: Payer: Self-pay | Admitting: Family Medicine

## 2023-09-27 ENCOUNTER — Encounter: Payer: Self-pay | Admitting: Obstetrics & Gynecology

## 2023-09-27 ENCOUNTER — Telehealth: Payer: Self-pay | Admitting: *Deleted

## 2023-09-27 ENCOUNTER — Inpatient Hospital Stay (HOSPITAL_COMMUNITY)
Admission: AD | Admit: 2023-09-27 | Discharge: 2023-09-27 | Disposition: A | Discharge status: Home / Self Care | Attending: Obstetrics & Gynecology | Admitting: Obstetrics & Gynecology

## 2023-09-27 ENCOUNTER — Encounter (HOSPITAL_COMMUNITY): Payer: Self-pay | Admitting: Obstetrics & Gynecology

## 2023-09-27 DIAGNOSIS — E611 Iron deficiency: Secondary | ICD-10-CM | POA: Diagnosis not present

## 2023-09-27 DIAGNOSIS — O99282 Endocrine, nutritional and metabolic diseases complicating pregnancy, second trimester: Secondary | ICD-10-CM | POA: Diagnosis not present

## 2023-09-27 DIAGNOSIS — O26892 Other specified pregnancy related conditions, second trimester: Secondary | ICD-10-CM | POA: Diagnosis not present

## 2023-09-27 DIAGNOSIS — R519 Headache, unspecified: Secondary | ICD-10-CM

## 2023-09-27 DIAGNOSIS — O10012 Pre-existing essential hypertension complicating pregnancy, second trimester: Secondary | ICD-10-CM | POA: Insufficient documentation

## 2023-09-27 DIAGNOSIS — Z79899 Other long term (current) drug therapy: Secondary | ICD-10-CM | POA: Insufficient documentation

## 2023-09-27 DIAGNOSIS — O162 Unspecified maternal hypertension, second trimester: Secondary | ICD-10-CM | POA: Diagnosis not present

## 2023-09-27 DIAGNOSIS — O163 Unspecified maternal hypertension, third trimester: Secondary | ICD-10-CM

## 2023-09-27 DIAGNOSIS — O24414 Gestational diabetes mellitus in pregnancy, insulin controlled: Secondary | ICD-10-CM | POA: Insufficient documentation

## 2023-09-27 DIAGNOSIS — Z3A27 27 weeks gestation of pregnancy: Secondary | ICD-10-CM | POA: Diagnosis not present

## 2023-09-27 LAB — COMPREHENSIVE METABOLIC PANEL WITH GFR
ALT: 14 U/L (ref 0–44)
AST: 15 U/L (ref 15–41)
Albumin: 2.2 g/dL — ABNORMAL LOW (ref 3.5–5.0)
Alkaline Phosphatase: 68 U/L (ref 38–126)
Anion gap: 10 (ref 5–15)
BUN: <5 mg/dL — ABNORMAL LOW (ref 6–20)
CO2: 19 mmol/L — ABNORMAL LOW (ref 22–32)
Calcium: 8.3 mg/dL — ABNORMAL LOW (ref 8.9–10.3)
Chloride: 103 mmol/L (ref 98–111)
Creatinine, Ser: 0.66 mg/dL (ref 0.44–1.00)
GFR, Estimated: >60 mL/min (ref >60)
Glucose, Bld: 296 mg/dL — ABNORMAL HIGH (ref 70–99)
Potassium: 4 mmol/L (ref 3.5–5.1)
Sodium: 132 mmol/L — ABNORMAL LOW (ref 135–145)
Total Bilirubin: 0.3 mg/dL (ref 0.0–1.2)
Total Protein: 6 g/dL — ABNORMAL LOW (ref 6.5–8.1)

## 2023-09-27 LAB — GLUCOSE, CAPILLARY: Glucose-Capillary: 130 mg/dL — ABNORMAL HIGH (ref 70–99)

## 2023-09-27 LAB — CBC
HCT: 32.4 % — ABNORMAL LOW (ref 36.0–46.0)
Hemoglobin: 10.9 g/dL — ABNORMAL LOW (ref 12.0–15.0)
MCH: 27 pg (ref 26.0–34.0)
MCHC: 33.6 g/dL (ref 30.0–36.0)
MCV: 80.2 fL (ref 80.0–100.0)
Platelets: 313 K/uL (ref 150–400)
RBC: 4.04 MIL/uL (ref 3.87–5.11)
RDW: 13.1 % (ref 11.5–15.5)
WBC: 8 K/uL (ref 4.0–10.5)
nRBC: 0 % (ref 0.0–0.2)

## 2023-09-27 LAB — PROTEIN / CREATININE RATIO, URINE
Creatinine, Urine: 101 mg/dL
Protein Creatinine Ratio: 0.16 mg/mg{creat} — ABNORMAL HIGH (ref 0.00–0.15)
Total Protein, Urine: 16 mg/dL

## 2023-09-27 MED ORDER — CYCLOBENZAPRINE HCL 5 MG PO TABS
5.0000 mg | ORAL_TABLET | Freq: Once | ORAL | Status: AC
  Administered 2023-09-27: 5 mg via ORAL
  Filled 2023-09-27: qty 1

## 2023-09-27 MED ORDER — FERROUS SULFATE 325 (65 FE) MG PO TBEC: 325.0000 mg | DELAYED_RELEASE_TABLET | Freq: Two times a day (BID) | ORAL | 0 refills | Status: DC

## 2023-09-27 MED ORDER — CAFFEINE 200 MG PO TABS
200.0000 mg | ORAL_TABLET | Freq: Once | ORAL | Status: AC
  Administered 2023-09-27: 200 mg via ORAL
  Filled 2023-09-27: qty 1

## 2023-09-27 MED ORDER — ONDANSETRON 4 MG PO TBDP
4.0000 mg | ORAL_TABLET | Freq: Four times a day (QID) | ORAL | Status: DC | PRN
  Administered 2023-09-27: 4 mg via ORAL
  Filled 2023-09-27: qty 1

## 2023-09-27 MED ORDER — ONDANSETRON 4 MG PO TBDP: 4.0000 mg | ORAL_TABLET | Freq: Four times a day (QID) | ORAL | 0 refills | Status: DC | PRN

## 2023-09-27 MED ORDER — ACETAMINOPHEN 500 MG PO TABS
500.0000 mg | ORAL_TABLET | Freq: Once | ORAL | Status: AC
  Administered 2023-09-27: 500 mg via ORAL
  Filled 2023-09-27: qty 1

## 2023-09-27 NOTE — Telephone Encounter (Signed)
 Called pt to discuss her concerns of headache as mentioned in a Mychart message. Per chart review, pt has Hx of severe pre-eclampsia. She stated that she has had H/A x3 days and has tried several doses of Tylenol  with minimal relief. She is also seeing spots or sparkles. Yesterday her BP @ home was 138/88 and today is 136/99. I advised that she needs further evaluation of these symptoms today @ MAU. Pt voiced understanding and stated that she will go to MAU as instructed.

## 2023-09-27 NOTE — Telephone Encounter (Signed)
 Opened in error

## 2023-09-27 NOTE — MAU Note (Addendum)
 Joanne Waller is a 38 y.o. at [redacted]w[redacted]d here in MAU reporting: headache that's started Monday and seeing sparkles/spots on Tuesday. Has attempted tylenol  with no relief states it helps some but it hasn't gone away completely. Denies upper RQ pain. Denies any abnormal swelling in extremities- states she didn't have swelling when she had Pre-E last time. Nausea that started this am that is gradually getting worse.  No CTXs, LOF or VB. +FM  Onset of complaint: ongoing  Pain score: HA 7 Vitals:   09/27/23 1058  BP: (!) 145/84  Pulse: (!) 112  Resp: 15  Temp: 97.9 F (36.6 C)  SpO2: 96%     FHT:148 Lab orders placed from triage:

## 2023-09-27 NOTE — MAU Provider Note (Addendum)
 Chief Complaint: Headache   Event Date/Time   First Provider Initiated Contact with Patient 09/27/23 1109     SUBJECTIVE HPI: Joanne Waller is a 38 y.o. [id number] at [redacted]w[redacted]d who presents to Maternity Admissions reporting HA and see spots. Starting HA on Monday which did not relieved w/ Tylenol . For cHTN she is currently on Nifedipine  30 mg daily. Denies vision change, n/v, or RUQ pain.   Pregnancy is also complicated by GDM A 2 on Metformin  and insulin    Past Medical History:  Diagnosis Date   Closed avulsion fracture of left ankle 08/14/2019   Depression    Diabetes mellitus without complication (HCC)    Hypertension    Pyelonephritis    Renal disorder    pyelonephritis   Seizures (HCC)    none since age 52   OB History  Gravida Para Term Preterm AB Living  5 2 2  2 2   SAB IAB Ectopic Multiple Live Births  1 1  0 2    # Outcome Date GA Lbr Len/2nd Weight Sex Type Anes PTL Lv  5 Current           4 IAB 2024          3 Term 02/23/21 [redacted]w[redacted]d 06:45 / 00:08 3530 g M Vag-Spont EPI  LIV     Birth Comments: Facial bruising   2 SAB 10/06/08        DEC  1 Term 03/17/04 [redacted]w[redacted]d  3330 g M Vag-Spont   LIV    Obstetric Comments  2023 Magnesium  for BP   Past Surgical History:  Procedure Laterality Date   CHOLECYSTECTOMY     DILATION AND CURETTAGE OF UTERUS     Social History   Socioeconomic History   Marital status: Single    Spouse name: Not on file   Number of children: Not on file   Years of education: Not on file   Highest education level: Not on file  Occupational History   Not on file  Tobacco Use   Smoking status: Former    Current packs/day: 0.00    Average packs/day: 0.5 packs/day for 7.0 years (3.5 ttl pk-yrs)    Types: Cigarettes    Start date: 06/23/2013    Quit date: 06/23/2020    Years since quitting: 3.2   Smokeless tobacco: Never  Vaping Use   Vaping status: Never Used  Substance and Sexual Activity   Alcohol use: Not Currently    Comment: not while preg   Drug  use: No   Sexual activity: Yes    Partners: Male    Birth control/protection: None  Other Topics Concern   Not on file  Social History Narrative   Not on file   Social Drivers of Health   Financial Resource Strain: Not on file  Food Insecurity: No Food Insecurity (11/28/2020)   Hunger Vital Sign    Worried About Running Out of Food in the Last Year: Never true    Ran Out of Food in the Last Year: Never true  Transportation Needs: No Transportation Needs (11/28/2020)   PRAPARE - Administrator, Civil Service (Medical): No    Lack of Transportation (Non-Medical): No  Physical Activity: Not on file  Stress: Not on file  Social Connections: Unknown (04/06/2022)   [facility] from [facility]   Social Network    Social Network: Not on file  Intimate Partner Violence: Unknown (04/06/2022)   [facility]   HITS  Physically Hurt: Not on file    Insult or Talk Down To: Not on file    Threaten Physical Harm: Not on file    Scream or Curse: Not on file   Family History  Problem Relation Age of Onset   Kidney disease Mother        kidney stones   Hypertension Mother    Sickle cell anemia Father    Cancer Maternal Grandmother    Diabetes Maternal Grandmother    Hyperlipidemia Maternal Grandmother    No current facility-administered medications on file prior to encounter.   Current Outpatient Medications on File Prior to Encounter  Medication Sig Dispense Refill   ACCU-CHEK GUIDE test strip      Accu-Chek Softclix Lancets lancets daily.     aspirin  81 MG chewable tablet Chew 2 tablets (162 mg total) by mouth daily. 60 tablet 8   Continuous Glucose Sensor (DEXCOM G7 SENSOR) MISC USE AS DIRECTED 3 each 10   glucose blood test strip Check 4 x daily 100 each 2   insulin  aspart (NOVOLOG  FLEXPEN) 100 UNIT/ML FlexPen Inject 30 Units into the skin 3 (three) times daily with meals.     insulin  glargine-yfgn (SEMGLEE ) 100 UNIT/ML Pen Inject under the skin 45 units  with breakfast and 35 units before bed 30 mL 6   metFORMIN  (GLUCOPHAGE ) 500 MG tablet Take 2 tablets (1,000 mg total) by mouth 2 (two) times daily with a meal. 120 tablet 9   NIFEdipine  (PROCARDIA  XL/NIFEDICAL XL) 60 MG 24 hr tablet Take 1 tablet (60 mg total) by mouth daily. 90 tablet 3   Accu-Chek Softclix Lancets lancets 1 each by Other route 4 (four) times daily. 100 each 2   acetaminophen  (TYLENOL ) 325 MG tablet Take 2 tablets (650 mg total) by mouth every 4 (four) hours as needed for moderate pain (for pain scale < 4).     Blood Glucose Monitoring Suppl (ACCU-CHEK GUIDE) w/Device KIT      Blood Glucose Monitoring Suppl (ACCU-CHEK GUIDE) w/Device KIT 1 Device by Does not apply route as directed. 1 kit 0   Prenatal Vit-Fe Fumarate-FA (PREPLUS) 27-1 MG TABS Take 1 tablet by mouth daily. 30 tablet 8   terconazole  (TERAZOL 7 ) 0.4 % vaginal cream Place 1 applicator vaginally at bedtime. (Patient not taking: Reported on 09/03/2023) 45 g 0   [DISCONTINUED] ipratropium (ATROVENT ) 0.06 % nasal spray Place 2 sprays into both nostrils 4 (four) times daily. 15 mL 0   Allergies  Allergen Reactions   Amoxicillin Other (See Comments)    Pt stated, I get a severe blinding headache   Penicillins Other (See Comments)    Sharp pain in temple    Latex Itching, Swelling and Rash    I have reviewed patient's Past Medical Hx, Surgical Hx, Family Hx, Social Hx, medications and allergies.   Review of Systems  Eyes:  Positive for visual disturbance (spotting).  Respiratory: Negative.    Cardiovascular: Negative.   Gastrointestinal:  Positive for nausea.    OBJECTIVE Patient Vitals for the past 24 hrs:  BP Temp Temp src Pulse Resp SpO2 Height Weight  09/27/23 1317 -- -- -- -- -- 100 % -- --  09/27/23 1310 -- -- -- -- -- 99 % -- --  09/27/23 1300 125/73 -- -- 90 -- 98 % -- --  09/27/23 1245 126/66 -- -- 89 -- 98 % -- --  09/27/23 1230 121/68 -- -- 100 -- 98 % -- --  09/27/23 1215 131/74 -- --  99 -- 98  % -- --  09/27/23 1200 136/78 -- -- (!) 104 -- 97 % -- --  09/27/23 1145 135/78 -- -- (!) 101 -- 97 % -- --  09/27/23 1058 (!) 145/84 97.9 F (36.6 C) Oral (!) 112 15 96 % 5' 10 (1.778 m) 134.3 kg   Constitutional: Well-developed, well-nourished female in no acute distress.  Cardiovascular: normal rate & rhythm, no murmur Respiratory: normal rate and effort. Lung sounds clear throughout GI: Abd soft, non-tender, Pos BS x 4. No guarding or rebound tenderness MS: Extremities nontender, no edema, normal ROM Neurologic: Alert and oriented x 4.   FHRT: Cat 1  Baseline: 145 Variability: moderate  Accelerations: present  Decelerations: absent Toco: quite    LAB RESULTS Results for orders placed or performed during the hospital encounter of 09/27/23 (from the past 24 hours)  Protein / creatinine ratio, urine     Status: Abnormal   Collection Time: 09/27/23 11:17 AM  Result Value Ref Range   Creatinine, Urine 101 mg/dL   Total Protein, Urine 16 mg/dL   Protein Creatinine Ratio 0.16 (H) 0.00 - 0.15 mg/mg[Cre]  Comprehensive metabolic panel     Status: Abnormal   Collection Time: 09/27/23 11:28 AM  Result Value Ref Range   Sodium 132 (L) 135 - 145 mmol/L   Potassium 4.0 3.5 - 5.1 mmol/L   Chloride 103 98 - 111 mmol/L   CO2 19 (L) 22 - 32 mmol/L   Glucose, Bld 296 (H) 70 - 99 mg/dL   BUN <5 (L) 6 - 20 mg/dL   Creatinine, Ser 9.33 0.44 - 1.00 mg/dL   Calcium 8.3 (L) 8.9 - 10.3 mg/dL   Total Protein 6.0 (L) 6.5 - 8.1 g/dL   Albumin 2.2 (L) 3.5 - 5.0 g/dL   AST 15 15 - 41 U/L   ALT 14 0 - 44 U/L   Alkaline Phosphatase 68 38 - 126 U/L   Total Bilirubin 0.3 0.0 - 1.2 mg/dL   GFR, Estimated >39 >39 mL/min   Anion gap 10 5 - 15  CBC     Status: Abnormal   Collection Time: 09/27/23 11:28 AM  Result Value Ref Range   WBC 8.0 4.0 - 10.5 K/uL   RBC 4.04 3.87 - 5.11 MIL/uL   Hemoglobin 10.9 (L) 12.0 - 15.0 g/dL   HCT 67.5 (L) 63.9 - 53.9 %   MCV 80.2 80.0 - 100.0 fL   MCH 27.0 26.0 -  34.0 pg   MCHC 33.6 30.0 - 36.0 g/dL   RDW 86.8 88.4 - 84.4 %   Platelets 313 150 - 400 K/uL   nRBC 0.0 0.0 - 0.2 %  Glucose, capillary     Status: Abnormal   Collection Time: 09/27/23  1:23 PM  Result Value Ref Range   Glucose-Capillary 130 (H) 70 - 99 mg/dL    IMAGING No results found.  MAU COURSE Orders Placed This Encounter  Procedures   Comprehensive metabolic panel   CBC   Protein / creatinine ratio, urine   Glucose, capillary   Diet - low sodium heart healthy   Increase activity slowly   Discharge patient Discharge disposition: 01-Home or Self Care; Discharge patient date: 09/27/2023   Discharge patient   Meds ordered this encounter  Medications   ondansetron  (ZOFRAN -ODT) disintegrating tablet 4 mg   acetaminophen  (TYLENOL ) tablet 500 mg   cyclobenzaprine  (FLEXERIL ) tablet 5 mg   caffeine  tablet 200 mg   ondansetron  (ZOFRAN -ODT) 4 MG disintegrating tablet  Sig: Take 1 tablet (4 mg total) by mouth every 6 (six) hours as needed for nausea or vomiting.    Dispense:  20 tablet    Refill:  0   ferrous sulfate  325 (65 FE) MG EC tablet    Sig: Take 1 tablet (325 mg total) by mouth 2 (two) times daily.    Dispense:  60 tablet    Refill:  0    MDM CMP, Urine Protein/Cr Zofran  4 mg PO Tylenol  and Flexeril  w/ Caffeine  tablet for HA   ASSESSMENT/PLAN 1. Hypertension affecting pregnancy in third trimester   2. Nonintractable headache, unspecified chronicity pattern, unspecified headache type   3. [redacted] weeks gestation of pregnancy    1. Hypertension affecting pregnancy in third trimester (Primary) Pt w/ Chronic HTN, currently take Nifedipine  30 mg daily. BP improved throughout the visit w/ sBP in 120s at the end of the visit. - CMP, Urine Protein/Cr negative for preeclampsia   2. Nonintractable headache, unspecified chronicity pattern, unspecified headache type - Tylenol  and Flexeril  w/ Caffeine  tablet for HA - Zofran  4 mg PO given and prescribed for nauseated  induced by HA  3. [redacted] weeks gestation of pregnancy - starting Iron supplement for Iron deficiency  - Discharged w/ stable condition - f/u with OBGYN as scheduled   Allergies as of 09/27/2023       Reactions   Amoxicillin Other (See Comments)   Pt stated, I get a severe blinding headache   Penicillins Other (See Comments)   Sharp pain in temple    Latex Itching, Swelling, Rash        Medication List     TAKE these medications    Accu-Chek Guide test strip Generic drug: glucose blood   glucose blood test strip Check 4 x daily   Accu-Chek Guide w/Device Kit   Accu-Chek Guide w/Device Kit 1 Device by Does not apply route as directed.   Accu-Chek Softclix Lancets lancets daily.   Accu-Chek Softclix Lancets lancets 1 each by Other route 4 (four) times daily.   acetaminophen  325 MG tablet Commonly known as: Tylenol  Take 2 tablets (650 mg total) by mouth every 4 (four) hours as needed for moderate pain (for pain scale < 4).   aspirin  81 MG chewable tablet Chew 2 tablets (162 mg total) by mouth daily.   Dexcom G7 Sensor Misc USE AS DIRECTED   ferrous sulfate  325 (65 FE) MG EC tablet Take 1 tablet (325 mg total) by mouth 2 (two) times daily.   insulin  glargine-yfgn 100 UNIT/ML Pen Commonly known as: SEMGLEE  Inject under the skin 45 units with breakfast and 35 units before bed   metFORMIN  500 MG tablet Commonly known as: GLUCOPHAGE  Take 2 tablets (1,000 mg total) by mouth 2 (two) times daily with a meal.   NIFEdipine  60 MG 24 hr tablet Commonly known as: PROCARDIA  XL/NIFEDICAL XL Take 1 tablet (60 mg total) by mouth daily.   NovoLOG  FlexPen 100 UNIT/ML FlexPen Generic drug: insulin  aspart Inject 30 Units into the skin 3 (three) times daily with meals.   ondansetron  4 MG disintegrating tablet Commonly known as: ZOFRAN -ODT Take 1 tablet (4 mg total) by mouth every 6 (six) hours as needed for nausea or vomiting.   PrePLUS 27-1 MG Tabs Take 1 tablet by  mouth daily.   terconazole  0.4 % vaginal cream Commonly known as: TERAZOL 7  Place 1 applicator vaginally at bedtime.         Suzen Houston NOVAK, DO PGY 1 Family Medicine  Resident  09/27/2023 6:45 PM    ---------------------------------------------------------  Attestation of Supervision of Student:  I confirm that I have verified the information documented in the Resident's  student's note and that I have also personally reperformed the history, physical exam and all medical decision making activities.  I have verified that all services and findings are accurately documented in this student's note; and I agree with management and plan as outlined in the documentation. I have also made any necessary editorial changes. -------------------------------------------------------------------------------  Olam DELENA Dalton, NP [facility] [employer], [facility] 09/27/2023 8:20 PM

## 2023-10-01 ENCOUNTER — Other Ambulatory Visit: Payer: Self-pay

## 2023-10-01 ENCOUNTER — Ambulatory Visit: Attending: Maternal & Fetal Medicine

## 2023-10-01 ENCOUNTER — Ambulatory Visit

## 2023-10-01 ENCOUNTER — Ambulatory Visit (HOSPITAL_BASED_OUTPATIENT_CLINIC_OR_DEPARTMENT_OTHER): Admitting: Maternal & Fetal Medicine

## 2023-10-01 VITALS — BP 131/80 | HR 112

## 2023-10-01 DIAGNOSIS — Z8759 Personal history of other complications of pregnancy, childbirth and the puerperium: Secondary | ICD-10-CM | POA: Insufficient documentation

## 2023-10-01 DIAGNOSIS — E119 Type 2 diabetes mellitus without complications: Secondary | ICD-10-CM

## 2023-10-01 DIAGNOSIS — O09522 Supervision of elderly multigravida, second trimester: Secondary | ICD-10-CM | POA: Diagnosis not present

## 2023-10-01 DIAGNOSIS — Z794 Long term (current) use of insulin: Secondary | ICD-10-CM

## 2023-10-01 DIAGNOSIS — O24113 Pre-existing diabetes mellitus, type 2, in pregnancy, third trimester: Secondary | ICD-10-CM

## 2023-10-01 DIAGNOSIS — Z3A28 28 weeks gestation of pregnancy: Secondary | ICD-10-CM | POA: Insufficient documentation

## 2023-10-01 DIAGNOSIS — O10912 Unspecified pre-existing hypertension complicating pregnancy, second trimester: Secondary | ICD-10-CM | POA: Diagnosis not present

## 2023-10-01 DIAGNOSIS — Z6839 Body mass index (BMI) 39.0-39.9, adult: Secondary | ICD-10-CM | POA: Insufficient documentation

## 2023-10-01 DIAGNOSIS — O10013 Pre-existing essential hypertension complicating pregnancy, third trimester: Secondary | ICD-10-CM

## 2023-10-01 DIAGNOSIS — O24112 Pre-existing diabetes mellitus, type 2, in pregnancy, second trimester: Secondary | ICD-10-CM | POA: Diagnosis not present

## 2023-10-01 DIAGNOSIS — O10913 Unspecified pre-existing hypertension complicating pregnancy, third trimester: Secondary | ICD-10-CM | POA: Diagnosis not present

## 2023-10-01 DIAGNOSIS — O09523 Supervision of elderly multigravida, third trimester: Secondary | ICD-10-CM

## 2023-10-01 DIAGNOSIS — O24119 Pre-existing diabetes mellitus, type 2, in pregnancy, unspecified trimester: Secondary | ICD-10-CM

## 2023-10-01 DIAGNOSIS — O99213 Obesity complicating pregnancy, third trimester: Secondary | ICD-10-CM

## 2023-10-01 DIAGNOSIS — O10919 Unspecified pre-existing hypertension complicating pregnancy, unspecified trimester: Secondary | ICD-10-CM

## 2023-10-01 DIAGNOSIS — O99212 Obesity complicating pregnancy, second trimester: Secondary | ICD-10-CM | POA: Insufficient documentation

## 2023-10-01 DIAGNOSIS — E669 Obesity, unspecified: Secondary | ICD-10-CM

## 2023-10-02 ENCOUNTER — Ambulatory Visit: Admitting: Family Medicine

## 2023-10-02 ENCOUNTER — Other Ambulatory Visit: Payer: Self-pay

## 2023-10-02 VITALS — BP 118/78 | HR 109 | Wt 294.0 lb

## 2023-10-02 DIAGNOSIS — O24119 Pre-existing diabetes mellitus, type 2, in pregnancy, unspecified trimester: Secondary | ICD-10-CM

## 2023-10-02 DIAGNOSIS — Z3A28 28 weeks gestation of pregnancy: Secondary | ICD-10-CM | POA: Diagnosis not present

## 2023-10-02 DIAGNOSIS — Z6839 Body mass index (BMI) 39.0-39.9, adult: Secondary | ICD-10-CM | POA: Diagnosis not present

## 2023-10-02 DIAGNOSIS — O09522 Supervision of elderly multigravida, second trimester: Secondary | ICD-10-CM

## 2023-10-02 DIAGNOSIS — O365930 Maternal care for other known or suspected poor fetal growth, third trimester, not applicable or unspecified: Secondary | ICD-10-CM | POA: Diagnosis not present

## 2023-10-02 DIAGNOSIS — O09523 Supervision of elderly multigravida, third trimester: Secondary | ICD-10-CM | POA: Diagnosis not present

## 2023-10-02 DIAGNOSIS — O10919 Unspecified pre-existing hypertension complicating pregnancy, unspecified trimester: Secondary | ICD-10-CM

## 2023-10-02 DIAGNOSIS — O10913 Unspecified pre-existing hypertension complicating pregnancy, third trimester: Secondary | ICD-10-CM

## 2023-10-02 DIAGNOSIS — O365920 Maternal care for other known or suspected poor fetal growth, second trimester, not applicable or unspecified: Secondary | ICD-10-CM

## 2023-10-02 DIAGNOSIS — O99213 Obesity complicating pregnancy, third trimester: Secondary | ICD-10-CM

## 2023-10-02 DIAGNOSIS — O99212 Obesity complicating pregnancy, second trimester: Secondary | ICD-10-CM

## 2023-10-02 DIAGNOSIS — Z3480 Encounter for supervision of other normal pregnancy, unspecified trimester: Secondary | ICD-10-CM

## 2023-10-02 DIAGNOSIS — O24113 Pre-existing diabetes mellitus, type 2, in pregnancy, third trimester: Secondary | ICD-10-CM

## 2023-10-02 DIAGNOSIS — Z8759 Personal history of other complications of pregnancy, childbirth and the puerperium: Secondary | ICD-10-CM

## 2023-10-02 LAB — GLUCOSE, CAPILLARY: Glucose-Capillary: 168 mg/dL — ABNORMAL HIGH (ref 70–99)

## 2023-10-02 NOTE — Progress Notes (Signed)
 Patient information  Patient Name: Joanne Waller  Patient MRN:   [id number]  Referring practice: MFM Referring Provider: [facility])  Problem List   Patient Active Problem List   Diagnosis Date Noted   Poor fetal growth affecting management of mother in second trimester 09/04/2023   History of severe pre-eclampsia 06/19/2023   BMI 39.0-39.9,adult 05/21/2023   AMA (advanced maternal age) multigravida 35+ 07/26/2022   Obesity affecting pregnancy 07/26/2022   Supervision of other normal pregnancy, antepartum 05/07/2022   Alpha thalassemia silent carrier 10/14/2020   Pre-existing type 2 diabetes affecting pregnancy, antepartum 08/15/2020   Chronic hypertension during pregnancy, antepartum 08/15/2020   Maternal Fetal medicine Consult  Joanne Waller is a 38 y.o. H4E7977 at [redacted]w[redacted]d here for ultrasound and consultation. Joanne Waller is doing well today with no acute concerns. Today we focused on the following:   DM2: Blood sugars are suboptimally controlled.  Fasting blood sugars between 93 and 150.  2-hour postprandials are 123-173.  She thinks she has the wrong dose of her insulin .  I reviewed her medications and she is taking 5 units less for each injection.  I instructed her to increase her Semglee  to 45 units in the morning and 35 units at night as it is prescribed.  I told her to do this for 2 to 3 days and if her blood sugars are still not well-controlled then she should increase her NovoLog  to 30 units with each meal.  Hypoglycemic precautions were given.  She will follow-up with her OB provider and have further adjustments to her medication at that time.  CHTN: Blood pressure is well-controlled today on Procardia .  Goal of less than 140/90.  The patient had time to ask questions that were answered to her satisfaction.  She verbalized understanding and agrees to proceed with the plan below.   Sonographic findings Single intrauterine pregnancy at 28w 2d.   Fetal cardiac activity:  Observed and appears normal. Presentation: Breech. Interval fetal anatomy appears normal. Fetal biometry shows the estimated fetal weight at the 26 percentile. Amniotic fluid volume: Within normal limits. MVP: 5.77 cm. Placenta: Anterior.  There are limitations of prenatal ultrasound such as the inability to detect certain abnormalities due to poor visualization. Various factors such as fetal position, gestational age and maternal body habitus may increase the difficulty in visualizing the fetal anatomy.    Recommendations -Serial growth ultrasounds every 4-6 weeks until delivery -Antenatal testing to start around 32 weeks  -Continue to adjust blood pressure medication and insulin  as necessary -Delivery around [redacted] weeks gestation or sooner if indicated  Review of Systems: A review of systems was performed and was negative except per HPI   Vitals and Physical Exam    10/01/2023    3:38 PM 09/27/2023    1:00 PM 09/27/2023   12:45 PM  Vitals with BMI  Systolic 131 125 873  Diastolic 80 73 66  Pulse 112 90 89    Sitting comfortably on the sonogram table Nonlabored breathing Normal rate and rhythm Abdomen is nontender  Past pregnancies OB History  Gravida Para Term Preterm AB Living  5 2 2  2 2   SAB IAB Ectopic Multiple Live Births  1 1  0 2    # Outcome Date GA Lbr Len/2nd Weight Sex Type Anes PTL Lv  5 Current           4 IAB 2024  3 Term 02/23/21 [redacted]w[redacted]d 06:45 / 00:08 7 lb 12.5 oz (3.53 kg) M Vag-Spont EPI  LIV     Birth Comments: Facial bruising   2 SAB 10/06/08        DEC  1 Term 03/17/04 [redacted]w[redacted]d  7 lb 5.5 oz (3.33 kg) M Vag-Spont   LIV    Obstetric Comments  2023 Magnesium  for BP     I spent 30 minutes reviewing the patients chart, including labs and images as well as counseling the patient about her medical conditions. Greater than 50% of the time was spent in direct face-to-face patient counseling.  Delora Smaller  MFM, [facility]    10/02/2023  11:57 AM

## 2023-10-02 NOTE — Progress Notes (Unsigned)
 PRENATAL VISIT NOTE  Subjective:  Joanne Waller is a 38 y.o. H4E7977 at [redacted]w[redacted]d being seen today for ongoing prenatal care.  She is currently monitored for the following issues for this {Blank single:19197::high-risk,low-risk} pregnancy and has Pre-existing type 2 diabetes mellitus, in pregnancy, third trimester; Chronic hypertension complicating or reason for care during pregnancy, third trimester; Alpha thalassemia silent carrier; Supervision of other normal pregnancy, antepartum; AMA (advanced maternal age) multigravida 35+; Obesity affecting pregnancy; BMI 39.0-39.9,adult; History of severe pre-eclampsia; and Poor fetal growth affecting management of mother in second trimester on their problem list.  Patient reports {sx:14538}.  Contractions: Not present. Vag. Bleeding: None.  Movement: Present. Denies leaking of fluid.   The following portions of the patient's history were reviewed and updated as appropriate: allergies, current medications, past family history, past medical history, past social history, past surgical history and problem list.   Objective:    Vitals:   10/02/23 1613  BP: 118/78  Pulse: (!) 109  Weight: 294 lb (133.4 kg)    Fetal Status:  Fetal Heart Rate (bpm): 151   Movement: Present    General: Alert, oriented and cooperative. Patient is in no acute distress.  Skin: Skin is warm and dry. No rash noted.   Cardiovascular: Normal heart rate noted  Respiratory: Normal respiratory effort, no problems with respiration noted  Abdomen: Soft, gravid, appropriate for gestational age.  Pain/Pressure: Absent     Pelvic: {Blank single:19197::Cervical exam performed in the presence of a chaperone,Cervical exam deferred}        Extremities: Normal range of motion.  Edema: None  Mental Status: Normal mood and affect. Normal behavior. Normal judgment and thought content.   Assessment and Plan:  Pregnancy: H4E7977 at [redacted]w[redacted]d 1. [redacted] weeks gestation of pregnancy  (Primary) *** - HIV Antibody (routine testing w rflx) - RPR - CBC  2. Supervision of other normal pregnancy, antepartum ***  3. Poor fetal growth affecting management of mother in second trimester, single or unspecified fetus ***  4. Multigravida of advanced maternal age in second trimester ***  5. BMI 39.0-39.9,adult ***  6. History of severe pre-eclampsia ***  7. Obesity affecting pregnancy in second trimester, unspecified obesity type ***  8. Chronic hypertension during pregnancy, antepartum ***  9. Pre-existing type 2 diabetes affecting pregnancy, antepartum ***  {Blank single:19197::Term,Preterm} labor symptoms and general obstetric precautions including but not limited to vaginal bleeding, contractions, leaking of fluid and fetal movement were reviewed in detail with the patient. Please refer to After Visit Summary for other counseling recommendations.   No follow-ups on file.  Future Appointments  Date Time Provider Department Center  10/16/2023  1:55 PM Ilean Norleen GAILS, MD [facility]  10/29/2023  1:15 PM [facility] PROVIDER 1 [facility]  10/29/2023  1:30 PM [facility] US3 [facility]  10/30/2023  4:15 PM Eveline Lynwood MATSU, MD [facility]  11/04/2023  1:15 PM [facility] PROVIDER 1 [facility]  11/04/2023  1:30 PM [facility] US2 [facility]  11/11/2023  1:15 PM [facility] PROVIDER 1 [facility]  11/11/2023  1:30 PM [facility] US4 [facility]  11/14/2023  3:55 PM Zina Jerilynn LABOR, MD [facility]  11/18/2023  1:15 PM [facility] PROVIDER 1 [facility]  11/18/2023  1:30 PM [facility] US4 [facility]  11/25/2023  1:15 PM [facility] PROVIDER 1 [facility]  11/25/2023  1:30 PM [facility] US4 [facility]  11/27/2023  1:55 PM Ilean Norleen GAILS, MD [facility]  12/04/2023  3:15 PM Rise Traeger, Norleen GAILS, MD  [facility]  12/11/2023  2:35 PM [facility] 1 [facility]  12/18/2023  2:35 PM [facility]GENERAL 1 [facility]  12/25/2023  2:35 PM [facility]GENERAL 1 [facility]    Norleen LULLA Rover, MD

## 2023-10-03 LAB — RPR: RPR Ser Ql: NONREACTIVE

## 2023-10-03 LAB — CBC
Hematocrit: 34.2 % (ref 34.0–46.6)
Hemoglobin: 11.2 g/dL (ref 11.1–15.9)
MCH: 27.1 pg (ref 26.6–33.0)
MCHC: 32.7 g/dL (ref 31.5–35.7)
MCV: 83 fL (ref 79–97)
Platelets: 346 x10E3/uL (ref 150–450)
RBC: 4.14 x10E6/uL (ref 3.77–5.28)
RDW: 13 % (ref 11.7–15.4)
WBC: 9.2 x10E3/uL (ref 3.4–10.8)

## 2023-10-03 LAB — HIV ANTIBODY (ROUTINE TESTING W REFLEX): HIV Screen 4th Generation wRfx: NONREACTIVE

## 2023-10-16 ENCOUNTER — Encounter: Admitting: Family Medicine

## 2023-10-17 ENCOUNTER — Ambulatory Visit (INDEPENDENT_AMBULATORY_CARE_PROVIDER_SITE_OTHER): Admitting: Obstetrics & Gynecology

## 2023-10-17 ENCOUNTER — Other Ambulatory Visit: Payer: Self-pay

## 2023-10-17 VITALS — BP 113/77 | HR 113 | Wt 299.0 lb

## 2023-10-17 DIAGNOSIS — O09523 Supervision of elderly multigravida, third trimester: Secondary | ICD-10-CM | POA: Diagnosis not present

## 2023-10-17 DIAGNOSIS — O24113 Pre-existing diabetes mellitus, type 2, in pregnancy, third trimester: Secondary | ICD-10-CM | POA: Diagnosis not present

## 2023-10-17 DIAGNOSIS — O99213 Obesity complicating pregnancy, third trimester: Secondary | ICD-10-CM

## 2023-10-17 DIAGNOSIS — Z3A30 30 weeks gestation of pregnancy: Secondary | ICD-10-CM

## 2023-10-17 DIAGNOSIS — O99212 Obesity complicating pregnancy, second trimester: Secondary | ICD-10-CM

## 2023-10-17 DIAGNOSIS — Z3480 Encounter for supervision of other normal pregnancy, unspecified trimester: Secondary | ICD-10-CM

## 2023-10-17 NOTE — Progress Notes (Signed)
   PRENATAL VISIT NOTE  Subjective:  Joanne Waller is a 38 y.o. H4E7977 at [redacted]w[redacted]d being seen today for ongoing prenatal care.  She is currently monitored for the following issues for this high-risk pregnancy and has Pre-existing type 2 diabetes mellitus, in pregnancy, third trimester; Chronic hypertension complicating or reason for care during pregnancy, third trimester; Alpha thalassemia silent carrier; Supervision of other normal pregnancy, antepartum; AMA (advanced maternal age) multigravida 35+; Obesity affecting pregnancy; BMI 39.0-39.9,adult; and History of severe pre-eclampsia on their problem list.  Patient reports headache.  Contractions: Not present. Vag. Bleeding: None.  Movement: Present. Denies leaking of fluid.   The following portions of the patient's history were reviewed and updated as appropriate: allergies, current medications, past family history, past medical history, past social history, past surgical history and problem list.   Objective:    Vitals:   10/17/23 1355  BP: 113/77  Pulse: (!) 113  Weight: 299 lb (135.6 kg)    Fetal Status:  Fetal Heart Rate (bpm): 155   Movement: Present    General: Alert, oriented and cooperative. Patient is in no acute distress.  Skin: Skin is warm and dry. No rash noted.   Cardiovascular: Normal heart rate noted  Respiratory: Normal respiratory effort, no problems with respiration noted  Abdomen: Soft, gravid, appropriate for gestational age.  Pain/Pressure: Absent     Pelvic: Cervical exam deferred        Extremities: Normal range of motion.  Edema: None  Mental Status: Normal mood and affect. Normal behavior. Normal judgment and thought content.   Assessment and Plan:  Pregnancy: H4E7977 at [redacted]w[redacted]d 1. [redacted] weeks gestation of pregnancy (Primary) Occasional headache that responds to tylenol   2. Obesity affecting pregnancy in second trimester, unspecified obesity type   3. Pre-existing type 2 diabetes mellitus, in pregnancy,  third trimester FBS <105 and often <95, PP <140  4. Multigravida of advanced maternal age in third trimester   5. Supervision of other normal pregnancy, antepartum   Preterm labor symptoms and general obstetric precautions including but not limited to vaginal bleeding, contractions, leaking of fluid and fetal movement were reviewed in detail with the patient. Please refer to After Visit Summary for other counseling recommendations.   Return in about 2 weeks (around 10/31/2023).  Future Appointments  Date Time Provider Department Center  10/29/2023  1:15 PM [facility] PROVIDER 1 [facility]  10/29/2023  1:30 PM [facility] US3 [facility]  10/30/2023  4:15 PM Eveline Lynwood MATSU, MD [facility]  11/04/2023  1:15 PM [facility] PROVIDER 1 [facility]  11/04/2023  1:30 PM [facility] US2 [facility]  11/11/2023  1:15 PM [facility] PROVIDER 1 [facility]  11/11/2023  1:30 PM [facility] US4 [facility]  11/14/2023  3:55 PM Zina Jerilynn LABOR, MD [facility]  11/18/2023  1:15 PM [facility] PROVIDER 1 [facility]  11/18/2023  1:30 PM [facility] US4 [facility]  11/25/2023  1:15 PM [facility] PROVIDER 1 [facility]  11/25/2023  1:30 PM [facility] US4 [facility]  11/27/2023  1:55 PM Ilean Norleen GAILS, MD [facility]  12/04/2023  3:15 PM Ilean Norleen GAILS, MD [facility]  12/11/2023  2:35 PM [facility]GENERAL 1 [facility]  12/18/2023  2:35 PM [facility]GENERAL 1 [facility]  12/25/2023  2:35 PM [facility]AL 1 [facility]    Lynwood Eveline, MD

## 2023-10-21 ENCOUNTER — Encounter: Payer: Self-pay | Admitting: Obstetrics & Gynecology

## 2023-10-21 ENCOUNTER — Other Ambulatory Visit: Payer: Self-pay | Admitting: *Deleted

## 2023-10-21 DIAGNOSIS — O10019 Pre-existing essential hypertension complicating pregnancy, unspecified trimester: Secondary | ICD-10-CM

## 2023-10-21 MED ORDER — NOVOLOG FLEXPEN 100 UNIT/ML ~~LOC~~ SOPN: 30.0000 [IU] | PEN_INJECTOR | Freq: Three times a day (TID) | SUBCUTANEOUS | 2 refills | Status: DC

## 2023-10-23 ENCOUNTER — Other Ambulatory Visit (HOSPITAL_COMMUNITY): Payer: Self-pay

## 2023-10-23 ENCOUNTER — Encounter (HOSPITAL_COMMUNITY): Payer: Self-pay | Admitting: Pharmacist

## 2023-10-24 ENCOUNTER — Other Ambulatory Visit (HOSPITAL_COMMUNITY): Payer: Self-pay

## 2023-10-29 ENCOUNTER — Ambulatory Visit: Attending: Maternal & Fetal Medicine

## 2023-10-29 ENCOUNTER — Ambulatory Visit (HOSPITAL_BASED_OUTPATIENT_CLINIC_OR_DEPARTMENT_OTHER): Admitting: Obstetrics

## 2023-10-29 ENCOUNTER — Ambulatory Visit

## 2023-10-29 VITALS — BP 123/75 | HR 100

## 2023-10-29 DIAGNOSIS — O10913 Unspecified pre-existing hypertension complicating pregnancy, third trimester: Secondary | ICD-10-CM

## 2023-10-29 DIAGNOSIS — O99212 Obesity complicating pregnancy, second trimester: Secondary | ICD-10-CM | POA: Insufficient documentation

## 2023-10-29 DIAGNOSIS — O10912 Unspecified pre-existing hypertension complicating pregnancy, second trimester: Secondary | ICD-10-CM | POA: Insufficient documentation

## 2023-10-29 DIAGNOSIS — Z6839 Body mass index (BMI) 39.0-39.9, adult: Secondary | ICD-10-CM | POA: Diagnosis not present

## 2023-10-29 DIAGNOSIS — Z8759 Personal history of other complications of pregnancy, childbirth and the puerperium: Secondary | ICD-10-CM | POA: Diagnosis not present

## 2023-10-29 DIAGNOSIS — O24113 Pre-existing diabetes mellitus, type 2, in pregnancy, third trimester: Secondary | ICD-10-CM | POA: Insufficient documentation

## 2023-10-29 DIAGNOSIS — O10013 Pre-existing essential hypertension complicating pregnancy, third trimester: Secondary | ICD-10-CM | POA: Diagnosis not present

## 2023-10-29 DIAGNOSIS — O24112 Pre-existing diabetes mellitus, type 2, in pregnancy, second trimester: Secondary | ICD-10-CM | POA: Insufficient documentation

## 2023-10-29 DIAGNOSIS — Z3A32 32 weeks gestation of pregnancy: Secondary | ICD-10-CM | POA: Insufficient documentation

## 2023-10-29 DIAGNOSIS — O99213 Obesity complicating pregnancy, third trimester: Secondary | ICD-10-CM

## 2023-10-29 DIAGNOSIS — O09523 Supervision of elderly multigravida, third trimester: Secondary | ICD-10-CM

## 2023-10-29 DIAGNOSIS — O09522 Supervision of elderly multigravida, second trimester: Secondary | ICD-10-CM | POA: Insufficient documentation

## 2023-10-29 DIAGNOSIS — E669 Obesity, unspecified: Secondary | ICD-10-CM

## 2023-10-29 NOTE — Progress Notes (Signed)
 MFM Consult Note  Joanne Waller is currently at 32 weeks and 2 days.  She has been followed due to advanced maternal age (38 years old), maternal obesity with a BMI of 39, pregestational diabetes treated with insulin  and metformin , and chronic hypertension treated with Procardia .    The patient reports that she forgot to take her insulin  for the past 2 days because she left her insulin  at home.  She reports that her fingerstick values have been elevated as she did not give herself the insulin  injections.  She had a normal fetal echocardiogram performed with [facility] pediatric [facility]ology.  Sonographic findings Single intrauterine pregnancy at 32w 2d.  Fetal cardiac activity:  Observed and appears normal. Presentation: Cephalic. Interval fetal anatomy appears normal. Fetal biometry shows the estimated fetal weight of 4 pounds 2 ounces which measures at the 29th percentile. Amniotic fluid volume: Within normal limits. MVP: 4.95 cm. Placenta: Anterior. BPP: 8/8.  There are limitations of prenatal ultrasound such as the inability to detect certain abnormalities due to poor visualization. Various factors such as fetal position, gestational age and maternal body habitus may increase the difficulty in visualizing the fetal anatomy.    The patient was encouraged to use insulin  as prescribed in addition to metformin  for glycemic control.  She promised she would go home and restart the insulin  injections again.  We will continue to follow her with weekly fetal testing until delivery.  The goal for her delivery would be at between 37 to 38 weeks.    She will return in 1 week for another BPP.  The patient stated that all of her questions were answered today.  A total of 20 minutes was spent counseling and coordinating the care for this patient.  Greater than 50% of the time was spent in direct face-to-face contact.

## 2023-10-30 ENCOUNTER — Other Ambulatory Visit: Payer: Self-pay

## 2023-10-30 ENCOUNTER — Ambulatory Visit (INDEPENDENT_AMBULATORY_CARE_PROVIDER_SITE_OTHER): Admitting: Obstetrics & Gynecology

## 2023-10-30 VITALS — BP 132/86 | HR 101 | Wt 300.8 lb

## 2023-10-30 DIAGNOSIS — O10913 Unspecified pre-existing hypertension complicating pregnancy, third trimester: Secondary | ICD-10-CM

## 2023-10-30 DIAGNOSIS — O09523 Supervision of elderly multigravida, third trimester: Secondary | ICD-10-CM

## 2023-10-30 DIAGNOSIS — O99213 Obesity complicating pregnancy, third trimester: Secondary | ICD-10-CM

## 2023-10-30 DIAGNOSIS — Z3A32 32 weeks gestation of pregnancy: Secondary | ICD-10-CM

## 2023-10-30 DIAGNOSIS — O24113 Pre-existing diabetes mellitus, type 2, in pregnancy, third trimester: Secondary | ICD-10-CM

## 2023-10-30 DIAGNOSIS — O99212 Obesity complicating pregnancy, second trimester: Secondary | ICD-10-CM

## 2023-10-30 DIAGNOSIS — Z3480 Encounter for supervision of other normal pregnancy, unspecified trimester: Secondary | ICD-10-CM

## 2023-10-30 NOTE — Progress Notes (Signed)
 PRENATAL VISIT NOTE  Subjective:  Joanne Waller is a 38 y.o. H4E7977 at [redacted]w[redacted]d being seen today for ongoing prenatal care.  She is currently monitored for the following issues for this high-risk pregnancy and has Pre-existing type 2 diabetes mellitus, in pregnancy, third trimester; Chronic hypertension complicating or reason for care during pregnancy, third trimester; Alpha thalassemia silent carrier; Supervision of other normal pregnancy, antepartum; AMA (advanced maternal age) multigravida 35+; Obesity affecting pregnancy; BMI 39.0-39.9,adult; and History of severe pre-eclampsia on their problem list.  Patient reports no complaints.  Contractions: Not present. Vag. Bleeding: None.  Movement: Present. Denies leaking of fluid.   The following portions of the patient's history were reviewed and updated as appropriate: allergies, current medications, past family history, past medical history, past social history, past surgical history and problem list.   Objective:    Vitals:   10/30/23 1636  BP: 132/86  Pulse: (!) 101  Weight: (!) 300 lb 12.8 oz (136.4 kg)    Fetal Status:  Fetal Heart Rate (bpm): 148   Movement: Present    General: Alert, oriented and cooperative. Patient is in no acute distress.  Skin: Skin is warm and dry. No rash noted.   Cardiovascular: Normal heart rate noted  Respiratory: Normal respiratory effort, no problems with respiration noted  Abdomen: Soft, gravid, appropriate for gestational age.  Pain/Pressure: Absent     Pelvic: Cervical exam deferred        Extremities: Normal range of motion.  Edema: None  Mental Status: Normal mood and affect. Normal behavior. Normal judgment and thought content.   Assessment and Plan:  Pregnancy: H4E7977 at [redacted]w[redacted]d 1. Supervision of other normal pregnancy, antepartum (Primary)   2. Obesity affecting pregnancy in second trimester, unspecified obesity type   3. Pre-existing type 2 diabetes mellitus, in pregnancy, third  trimester Controlled with insulin  and using Dexcom  4. Chronic hypertension complicating or reason for care during pregnancy, third trimester On Procardia   5. Multigravida of advanced maternal age in third trimester MFM f/u weekly  Preterm labor symptoms and general obstetric precautions including but not limited to vaginal bleeding, contractions, leaking of fluid and fetal movement were reviewed in detail with the patient. Please refer to After Visit Summary for other counseling recommendations.   Return in about 2 weeks (around 11/13/2023).  Future Appointments  Date Time Provider Department Center  11/04/2023  1:15 PM [facility] PROVIDER 1 [facility]  11/04/2023  1:30 PM [facility] US2 [facility]  11/05/2023  1:50 PM Rasch, [name], NP [facility]  11/11/2023  1:15 PM [facility] PROVIDER 1 [facility]  11/11/2023  1:30 PM [facility] US4 [facility]  11/12/2023  4:10 PM Rasch, [name], NP [facility]  11/14/2023  3:55 PM Zina Jerilynn LABOR, MD [facility]  11/18/2023  1:15 PM [facility] PROVIDER 1 [facility]  11/18/2023  1:30 PM [facility] US4 [facility]  11/19/2023  3:10 PM Rasch, [name], NP [facility]  11/25/2023  1:15 PM [facility] PROVIDER 1 [facility]  11/25/2023  1:30 PM [facility] US4 [facility]  11/26/2023  4:10 PM Rasch, [name], NP [facility]  11/27/2023  1:55 PM Ilean Norleen GAILS, MD [facility]  12/03/2023  4:10 PM Rasch, [name], NP [facility]  12/04/2023  3:15 PM Ilean Norleen GAILS, MD [facility]  12/11/2023  2:35 PM Izell Harari, MD [facility]  12/18/2023  2:35 PM Zina Jerilynn LABOR, MD [facility]  12/25/2023  2:35 PM Zina Jerilynn LABOR, MD  [facility]    Lynwood Solomons, MD

## 2023-10-31 ENCOUNTER — Inpatient Hospital Stay (HOSPITAL_COMMUNITY)
Admission: AD | Admit: 2023-10-31 | Discharge: 2023-10-31 | Disposition: A | Discharge status: Home / Self Care | Attending: Obstetrics and Gynecology | Admitting: Obstetrics and Gynecology

## 2023-10-31 ENCOUNTER — Encounter (HOSPITAL_COMMUNITY): Payer: Self-pay | Admitting: Obstetrics and Gynecology

## 2023-10-31 DIAGNOSIS — Z3A32 32 weeks gestation of pregnancy: Secondary | ICD-10-CM | POA: Diagnosis not present

## 2023-10-31 DIAGNOSIS — Z794 Long term (current) use of insulin: Secondary | ICD-10-CM | POA: Diagnosis not present

## 2023-10-31 DIAGNOSIS — E860 Dehydration: Secondary | ICD-10-CM

## 2023-10-31 DIAGNOSIS — O99280 Endocrine, nutritional and metabolic diseases complicating pregnancy, unspecified trimester: Secondary | ICD-10-CM

## 2023-10-31 DIAGNOSIS — O4703 False labor before 37 completed weeks of gestation, third trimester: Secondary | ICD-10-CM | POA: Diagnosis not present

## 2023-10-31 DIAGNOSIS — Z87891 Personal history of nicotine dependence: Secondary | ICD-10-CM | POA: Insufficient documentation

## 2023-10-31 DIAGNOSIS — O99283 Endocrine, nutritional and metabolic diseases complicating pregnancy, third trimester: Secondary | ICD-10-CM | POA: Diagnosis not present

## 2023-10-31 DIAGNOSIS — O24113 Pre-existing diabetes mellitus, type 2, in pregnancy, third trimester: Secondary | ICD-10-CM | POA: Insufficient documentation

## 2023-10-31 LAB — URINALYSIS, ROUTINE W REFLEX MICROSCOPIC
Bilirubin Urine: NEGATIVE
Glucose, UA: >=500 mg/dL — AB
Hgb urine dipstick: NEGATIVE
Ketones, ur: NEGATIVE mg/dL
Nitrite: NEGATIVE
Protein, ur: NEGATIVE mg/dL
Specific Gravity, Urine: 1.036 — ABNORMAL HIGH (ref 1.005–1.030)
pH: 6 (ref 5.0–8.0)

## 2023-10-31 LAB — GLUCOSE, CAPILLARY: Glucose-Capillary: 152 mg/dL — ABNORMAL HIGH (ref 70–99)

## 2023-10-31 NOTE — Discharge Instructions (Signed)

## 2023-10-31 NOTE — MAU Note (Signed)
 Joanne Waller is a 38 y.o. at [redacted]w[redacted]d here in MAU reporting: started contracting around 0230 this morning.  Coming frequently. Thinks they are every 30 seonds to a min. Not getting stronger.denies bleeding or LOF.  Reports +FM. Denies urinary problems, no diarrhea or constipation.   Onset of complaint: 0230 Pain score: moderate Vitals:   10/31/23 1430  BP: 131/84  Pulse: (!) 105  Resp: 20  Temp: 98.3 F (36.8 C)  SpO2: 98%     FHT:158 Lab orders placed from triage:  urine

## 2023-10-31 NOTE — MAU Provider Note (Signed)
 History     CSN: [id number]  Arrival date and time: 10/31/23 1344    Chief Complaint  Patient presents with   Contractions   HPI  Joanne Waller is a 38 y.o. H4E7977 at [redacted]w[redacted]d who presents for evaluation of possible contractions. Patient reports at 0230 she started noticing contractions. She states they have gotten closer together but not stronger in intensity.  Patient rates the pain as a 4/10 and has not tried anything for the pain.   She denies any vaginal bleeding, discharge, and leaking of fluid. Denies any constipation, diarrhea or any urinary complaints. Reports normal fetal movement.  She has known CHTN and taking medication. She has type 2 DM and reports her dexcom fell off yesterday and she hasn't replaced it yet but she is still taking insulin .    OB History     Gravida  5   Para  2   Term  2   Preterm      AB  2   Living  2      SAB  1   IAB  1   Ectopic      Multiple  0   Live Births  2        Obstetric Comments  2023 Magnesium  for BP         Past Medical History:  Diagnosis Date   Closed avulsion fracture of left ankle 08/14/2019   Depression    Diabetes mellitus without complication (HCC)    Hypertension    Pyelonephritis    Renal disorder    pyelonephritis   Seizures (HCC)    none since age 15    Past Surgical History:  Procedure Laterality Date   CHOLECYSTECTOMY     DILATION AND CURETTAGE OF UTERUS      Family History  Problem Relation Age of Onset   Kidney disease Mother        kidney stones   Hypertension Mother    Sickle cell anemia Father    Cancer Maternal Grandmother    Diabetes Maternal Grandmother    Hyperlipidemia Maternal Grandmother     Social History   Tobacco Use   Smoking status: Former    Current packs/day: 0.00    Average packs/day: 0.5 packs/day for 7.0 years (3.5 ttl pk-yrs)    Types: Cigarettes    Start date: 06/23/2013    Quit date: 06/23/2020    Years since quitting: 3.3   Smokeless  tobacco: Never  Vaping Use   Vaping status: Never Used  Substance Use Topics   Alcohol use: Not Currently    Comment: not while preg   Drug use: No    Allergies:  Allergies  Allergen Reactions   Amoxicillin Other (See Comments)    Pt stated, I get a severe blinding headache   Penicillins Other (See Comments)    Sharp pain in temple    Latex Itching, Swelling and Rash    No medications prior to admission.    Review of Systems  Constitutional: Negative.  Negative for fatigue and fever.  HENT: Negative.    Respiratory: Negative.  Negative for shortness of breath.   Cardiovascular: Negative.  Negative for chest pain.  Gastrointestinal: Negative.  Negative for abdominal pain, constipation, diarrhea, nausea and vomiting.  Genitourinary: Negative.  Negative for dysuria.  Neurological: Negative.  Negative for dizziness and headaches.   Physical Exam   Blood pressure 118/68, pulse 93, temperature 98.3 F (36.8 C), temperature source Oral, resp. rate 20,  height 5' 10 (1.778 m), weight (!) 138 kg, last menstrual period 03/11/2023, SpO2 98%, unknown if currently breastfeeding.  Patient Vitals for the past 24 hrs:  BP Temp Temp src Pulse Resp SpO2 Height Weight  10/31/23 1531 118/68 -- -- 93 -- 98 % -- --  10/31/23 1430 131/84 98.3 F (36.8 C) Oral (!) 105 20 98 % 5' 10 (1.778 m) (!) 138 kg    Physical Exam Vitals and nursing note reviewed.  Constitutional:      General: She is not in acute distress.    Appearance: She is well-developed.  HENT:     Head: Normocephalic.  Eyes:     Pupils: Pupils are equal, round, and reactive to light.  Cardiovascular:     Rate and Rhythm: Normal rate and regular rhythm.     Heart sounds: Normal heart sounds.  Pulmonary:     Effort: Pulmonary effort is normal. No respiratory distress.     Breath sounds: Normal breath sounds.  Abdominal:     General: Bowel sounds are normal. There is no distension.     Palpations: Abdomen is soft.      Tenderness: There is no abdominal tenderness.  Skin:    General: Skin is warm and dry.  Neurological:     Mental Status: She is alert and oriented to person, place, and time.  Psychiatric:        Mood and Affect: Mood normal.        Behavior: Behavior normal.        Thought Content: Thought content normal.        Judgment: Judgment normal.     Fetal Tracing:  Baseline: 130 Variability: moderate Accels: 15x15 Decels: none  Toco: none  Dilation: Closed Exam by:: KYM Betters, CNM   MAU Course  Procedures  Results for orders placed or performed during the hospital encounter of 10/31/23 (from the past 24 hours)  Urinalysis, Routine w reflex microscopic -Urine, Clean Catch     Status: Abnormal   Collection Time: 10/31/23  2:34 PM  Result Value Ref Range   Color, Urine YELLOW YELLOW   APPearance HAZY (A) CLEAR   Specific Gravity, Urine 1.036 (H) 1.005 - 1.030   pH 6.0 5.0 - 8.0   Glucose, UA >=500 (A) NEGATIVE mg/dL   Hgb urine dipstick NEGATIVE NEGATIVE   Bilirubin Urine NEGATIVE NEGATIVE   Ketones, ur NEGATIVE NEGATIVE mg/dL   Protein, ur NEGATIVE NEGATIVE mg/dL   Nitrite NEGATIVE NEGATIVE   Leukocytes,Ua TRACE (A) NEGATIVE   RBC / HPF 0-5 0 - 5 RBC/hpf   WBC, UA 0-5 0 - 5 WBC/hpf   Bacteria, UA FEW (A) NONE SEEN   Squamous Epithelial / HPF 0-5 0 - 5 /HPF   Mucus PRESENT   Glucose, capillary     Status: Abnormal   Collection Time: 10/31/23  3:46 PM  Result Value Ref Range   Glucose-Capillary 152 (H) 70 - 99 mg/dL     MDM Labs ordered and reviewed.   UA PO hydration encouraged NST reactive without contractions.  CBG  Reviewed UA indicating dehydration and likely cause of abdominal pain. Patient reports since drinking in MAU, abdominal pain resolved.   Discussed importance of using Dexcom and monitoring CBG  Assessment and Plan   1. Preterm uterine contractions in third trimester, antepartum   2. [redacted] weeks gestation of pregnancy   3. Dehydration during  pregnancy     -Discharge home in stable condition -Third trimester precautions discussed -Patient  advised to follow-up with OB as scheduled for prenatal care -Patient may return to MAU as needed or if her condition were to change or worsen  Aleck CHRISTELLA Fireman, CNM 10/31/2023, 3:11 PM

## 2023-11-04 ENCOUNTER — Ambulatory Visit (HOSPITAL_BASED_OUTPATIENT_CLINIC_OR_DEPARTMENT_OTHER): Admitting: Obstetrics

## 2023-11-04 ENCOUNTER — Ambulatory Visit: Attending: Obstetrics and Gynecology

## 2023-11-04 VITALS — BP 114/69 | HR 105

## 2023-11-04 DIAGNOSIS — O24119 Pre-existing diabetes mellitus, type 2, in pregnancy, unspecified trimester: Secondary | ICD-10-CM | POA: Insufficient documentation

## 2023-11-04 DIAGNOSIS — Z794 Long term (current) use of insulin: Secondary | ICD-10-CM | POA: Diagnosis not present

## 2023-11-04 DIAGNOSIS — O99213 Obesity complicating pregnancy, third trimester: Secondary | ICD-10-CM | POA: Insufficient documentation

## 2023-11-04 DIAGNOSIS — O10913 Unspecified pre-existing hypertension complicating pregnancy, third trimester: Secondary | ICD-10-CM | POA: Diagnosis not present

## 2023-11-04 DIAGNOSIS — Z6839 Body mass index (BMI) 39.0-39.9, adult: Secondary | ICD-10-CM | POA: Insufficient documentation

## 2023-11-04 DIAGNOSIS — O09523 Supervision of elderly multigravida, third trimester: Secondary | ICD-10-CM | POA: Diagnosis not present

## 2023-11-04 DIAGNOSIS — Z3A33 33 weeks gestation of pregnancy: Secondary | ICD-10-CM | POA: Insufficient documentation

## 2023-11-04 DIAGNOSIS — E119 Type 2 diabetes mellitus without complications: Secondary | ICD-10-CM

## 2023-11-04 DIAGNOSIS — O10013 Pre-existing essential hypertension complicating pregnancy, third trimester: Secondary | ICD-10-CM

## 2023-11-04 DIAGNOSIS — O10919 Unspecified pre-existing hypertension complicating pregnancy, unspecified trimester: Secondary | ICD-10-CM | POA: Insufficient documentation

## 2023-11-04 DIAGNOSIS — E669 Obesity, unspecified: Secondary | ICD-10-CM

## 2023-11-04 DIAGNOSIS — O24113 Pre-existing diabetes mellitus, type 2, in pregnancy, third trimester: Secondary | ICD-10-CM | POA: Insufficient documentation

## 2023-11-04 NOTE — Progress Notes (Signed)
 MFM Consult Note  Joanne Waller is currently at 33 weeks and 1 day.  She has been followed due to advanced maternal age (38 years old), maternal obesity with a BMI of 39, pregestational diabetes treated with insulin  and metformin , and chronic hypertension treated with Procardia .    She reports that she is using insulin  and metformin  for treatment of her diabetes.  Her fingersticks remain elevated.  She denies any problems since her last exam and reports feeling fetal movements throughout the day.  Her blood pressure today was 114/69.   Sonographic findings Single intrauterine pregnancy. Fetal cardiac activity: Observed. Presentation: Cephalic. Amniotic fluid: Within normal limits.  AFI 12.93 cm.  MVP: 4.89 cm. Placenta: Anterior. BPP: 8/8.   We will continue to follow her with weekly fetal testing until delivery.  Due to uncontrolled diabetes and chronic hypertension, delivery should be considered at around 37 weeks.  She will return in 1 week for another BPP.  The patient stated that all of her questions were answered today.  A total of 10 minutes was spent counseling and coordinating the care for this patient.  Greater than 50% of the time was spent in direct face-to-face contact.

## 2023-11-05 ENCOUNTER — Telehealth (INDEPENDENT_AMBULATORY_CARE_PROVIDER_SITE_OTHER): Admitting: Obstetrics and Gynecology

## 2023-11-05 ENCOUNTER — Ambulatory Visit: Admitting: Obstetrics and Gynecology

## 2023-11-05 ENCOUNTER — Other Ambulatory Visit: Payer: Self-pay

## 2023-11-05 ENCOUNTER — Encounter: Payer: Self-pay | Admitting: Obstetrics and Gynecology

## 2023-11-05 VITALS — BP 122/85 | HR 109 | Wt 303.0 lb

## 2023-11-05 DIAGNOSIS — O10913 Unspecified pre-existing hypertension complicating pregnancy, third trimester: Secondary | ICD-10-CM | POA: Diagnosis not present

## 2023-11-05 DIAGNOSIS — D563 Thalassemia minor: Secondary | ICD-10-CM

## 2023-11-05 DIAGNOSIS — O09523 Supervision of elderly multigravida, third trimester: Secondary | ICD-10-CM | POA: Diagnosis not present

## 2023-11-05 DIAGNOSIS — Z3A33 33 weeks gestation of pregnancy: Secondary | ICD-10-CM

## 2023-11-05 DIAGNOSIS — O99213 Obesity complicating pregnancy, third trimester: Secondary | ICD-10-CM

## 2023-11-05 DIAGNOSIS — O24113 Pre-existing diabetes mellitus, type 2, in pregnancy, third trimester: Secondary | ICD-10-CM

## 2023-11-05 DIAGNOSIS — Z7984 Long term (current) use of oral hypoglycemic drugs: Secondary | ICD-10-CM | POA: Diagnosis not present

## 2023-11-05 DIAGNOSIS — E119 Type 2 diabetes mellitus without complications: Secondary | ICD-10-CM | POA: Diagnosis not present

## 2023-11-05 DIAGNOSIS — O99212 Obesity complicating pregnancy, second trimester: Secondary | ICD-10-CM

## 2023-11-05 DIAGNOSIS — Z6839 Body mass index (BMI) 39.0-39.9, adult: Secondary | ICD-10-CM

## 2023-11-05 DIAGNOSIS — Z3480 Encounter for supervision of other normal pregnancy, unspecified trimester: Secondary | ICD-10-CM

## 2023-11-05 DIAGNOSIS — Z8759 Personal history of other complications of pregnancy, childbirth and the puerperium: Secondary | ICD-10-CM

## 2023-11-05 NOTE — Progress Notes (Signed)
 TELEHEALTH OBSTETRICS VISIT ENCOUNTER NOTE  FOR DIABETES MANAGEMENT DURING PREGNANCY    Provider location: Center for [facility] [place]   Patient location: Home  I connected with Joanne Waller @ on 11/05/23 at  1:50 PM EDT by telephone at home and verified that I am speaking with the correct person using two identifiers. Of note, unable to do video encounter due to technical difficulties.    I discussed the limitations, risks, security and privacy concerns of performing an evaluation and management service by telephone and the availability of in person appointments. I also discussed with the patient that there may be a patient responsible charge related to this service. The patient expressed understanding and agreed to proceed.   History:   Joanne Waller is a 38 y.o. H4E7977 at [redacted]w[redacted]d being seen today for diabetes management during pregnancy. Her obstetrical history is significant for type 2 DM.   Patient reports no complaints. Reports fetal movement. Denies any contractions, bleeding or leaking of fluid.   The following portions of the patient's history were reviewed and updated as appropriate: allergies, current medications, past family history, past medical history, past social history, past surgical history and problem list.           Past Medical History:  Diagnosis Date   Closed avulsion fracture of left ankle 08/14/2019   Depression    Diabetes mellitus without complication (HCC)    Hypertension    Pyelonephritis    Renal disorder    pyelonephritis   Seizures (HCC)    none since age 34   Past Surgical History:  Procedure Laterality Date   CHOLECYSTECTOMY     DILATION AND CURETTAGE OF UTERUS     Family History  Problem Relation Age of Onset   Kidney disease Mother        kidney stones   Hypertension Mother    Sickle cell anemia Father    Cancer Maternal Grandmother    Diabetes Maternal Grandmother    Hyperlipidemia Maternal Grandmother     Social History   Tobacco Use   Smoking status: Former    Current packs/day: 0.00    Average packs/day: 0.5 packs/day for 7.0 years (3.5 ttl pk-yrs)    Types: Cigarettes    Start date: 06/23/2013    Quit date: 06/23/2020    Years since quitting: 3.3   Smokeless tobacco: Never  Vaping Use   Vaping status: Never Used  Substance Use Topics   Alcohol use: Not Currently    Comment: not while preg   Drug use: No   Allergies  Allergen Reactions   Amoxicillin Other (See Comments)    Pt stated, I get a severe blinding headache   Penicillins Other (See Comments)    Sharp pain in temple    Latex Itching, Swelling and Rash   Current Outpatient Medications on File Prior to Visit  Medication Sig Dispense Refill   Accu-Chek Softclix Lancets lancets 1 each by Other route 4 (four) times daily. 100 each 2   acetaminophen  (TYLENOL ) 325 MG tablet Take 2 tablets (650 mg total) by mouth every 4 (four) hours as needed for moderate pain (for pain scale < 4).     aspirin  81 MG chewable tablet Chew 2 tablets (162 mg total) by mouth daily. 60 tablet 8   Blood Glucose Monitoring Suppl (ACCU-CHEK GUIDE) w/Device KIT 1 Device by Does not apply route as directed. 1 kit 0   Continuous Glucose Sensor (DEXCOM G7 SENSOR) MISC  USE AS DIRECTED 3 each 10   ferrous sulfate  325 (65 FE) MG EC tablet Take 1 tablet (325 mg total) by mouth 2 (two) times daily. (Patient not taking: Reported on 11/05/2023) 60 tablet 0   glucose blood test strip Check 4 x daily 100 each 2   insulin  aspart (NOVOLOG  FLEXPEN) 100 UNIT/ML FlexPen Inject 30 Units into the skin 3 (three) times daily with meals. 15 mL 2   insulin  glargine-yfgn (SEMGLEE ) 100 UNIT/ML Pen Inject under the skin 45 units with breakfast and 35 units before bed 30 mL 6   metFORMIN  (GLUCOPHAGE ) 500 MG tablet Take 2 tablets (1,000 mg total) by mouth 2 (two) times daily with a meal. 120 tablet 9   NIFEdipine  (PROCARDIA  XL/NIFEDICAL XL) 60 MG 24 hr tablet Take 1 tablet (60  mg total) by mouth daily. 90 tablet 3   ondansetron  (ZOFRAN -ODT) 4 MG disintegrating tablet Take 1 tablet (4 mg total) by mouth every 6 (six) hours as needed for nausea or vomiting. (Patient not taking: Reported on 11/05/2023) 20 tablet 0   [DISCONTINUED] ipratropium (ATROVENT ) 0.06 % nasal spray Place 2 sprays into both nostrils 4 (four) times daily. 15 mL 0   No current facility-administered medications on file prior to visit.   Objective:   General:  Alert, oriented and cooperative.   Mental Status: Normal mood and affect perceived. Normal judgment and thought content.   The Rest of physical exam deferred due to type of encounter   Type 2 DM   Viability scan    Anatomy scan   Growth US : Last US  was 10/29/23, EFW 29%tile.   Eye exam:    A1c at the time of diagnoses. 8.3% in June. Will recheck next week.    Most recent HgA1c: pending   Fetal Echo WNL 09/04/2023    Blood sugar monitoring: Dexcom in place. She was not connected with [facility] and no BS data is available for review. Walked her through connection with success. It takes about 24 hours for the data to move over.    With CGM, target Blood sugar set at 65-140.    Back up glucose monitor at home: yes.     Fasting BS this am was 112 Most of the time her BS is >100   2 hour PP: Majority of the time her BS is >120 Usually between 150-160 2 hours after meals.      Current Diabetes medications:   Metformin  Semglee  BID And Novolog  with all meals.   Plan:    Continue Metformin  1000 mg BID.   Semglee  45 q AM and 35 qPM > will increase to 45 units q PM   Continue 35/30/30 of Novolog .  If 2 hour PP are greater than 120 over the next few days, increase Novolog  by 4 units.   -Recommend dosing 15- 20 minutes prior to eating.    -Avoid post meal coverage due hypoglycemia.    -Discussed only 30-40 grams of carbohydrates with each meal, with majority of your meals being protein and high fiber vegetables.     -Start a fiber supplement everyday.    -Protein snack before bed. Recommend 10-20 grams of protein before bed.    -Anticipate weekly diabetes visits until BS's are better controlled -Anticipate frequent in person OB visits along with MFM care.  -q4 week growth US  starting at 20w with antenatal weekly testing starting at 32 weeks or sooner if needed.    -Continue BASA 81 mg  This visit is for the purposes of diabetes management only. Please keep scheduled OB visits with OB team for prenatal care.    Preterm labor symptoms and general obstetric precautions including but not limited to vaginal bleeding, contractions, leaking of fluid and fetal movement were reviewed in detail with the patient.  I discussed the assessment and treatment plan with the patient. The patient was provided an opportunity to ask questions and all were answered. The patient agreed with the plan and demonstrated an understanding of the instructions. The patient was advised to call back or seek an in-person office evaluation/go to MAU at [facility] for any urgent or concerning symptoms. Please refer to After Visit Summary for other counseling recommendations.    I provided 15 minutes of non-face-to-face time during this encounter.   Keyauna Graefe, [name], NP Faculty Practice Center for [employer], [facility]

## 2023-11-05 NOTE — Progress Notes (Signed)
 PRENATAL VISIT NOTE  Subjective:  Joanne Waller is a 38 y.o. H4E7977 at [redacted]w[redacted]d being seen today for ongoing prenatal care.  She is currently monitored for the following issues for this high-risk pregnancy and has Pre-existing type 2 diabetes mellitus, in pregnancy, third trimester; Chronic hypertension complicating or reason for care during pregnancy, third trimester; Alpha thalassemia silent carrier; Supervision of other normal pregnancy, antepartum; AMA (advanced maternal age) multigravida 35+; Obesity affecting pregnancy; BMI 39.0-39.9,adult; and History of severe pre-eclampsia on their problem list.  Patient reports no complaints.  Contractions: Irritability. Vag. Bleeding: None.  Movement: Present. Denies leaking of fluid.   The following portions of the patient's history were reviewed and updated as appropriate: allergies, current medications, past family history, past medical history, past social history, past surgical history and problem list.   Objective:    Vitals:   11/05/23 1121  BP: 122/85  Pulse: (!) 109  Weight: (!) 303 lb (137.4 kg)    Fetal Status:  Fetal Heart Rate (bpm): 154   Movement: Present    General: Alert, oriented and cooperative. Patient is in no acute distress.  Skin: Skin is warm and dry. No rash noted.   Cardiovascular: Normal heart rate noted  Respiratory: Normal respiratory effort, no problems with respiration noted  Abdomen: Soft, gravid, appropriate for gestational age.  Pain/Pressure: Absent     Pelvic: Cervical exam deferred        Extremities: Normal range of motion.  Edema: None  Mental Status: Normal mood and affect. Normal behavior. Normal judgment and thought content.   Assessment and Plan:  Pregnancy: H4E7977 at [redacted]w[redacted]d  1. Chronic hypertension complicating or reason for care during pregnancy, third trimester (Primary) Cont procardia  60 mg daily  2. Pre-existing type 2 diabetes mellitus, in pregnancy, third trimester -Metformin  1000 mg  BID, semglee  45/35 and novolog  30 TID -Reports fasting 93 yesterday, today 112, usually between 90s-110s, reports it is normal when she doesn't eat and higher when she eats in the middle of the night (like last night), reports post prandial now 155, after breakfast is always higher, then better after lunch -Has dexcom now -Since breakfast is always high, rec increasing am novolog  to 35 so do 35/30/30 -She is agreeable to this plan  3. Alpha thalassemia silent carrier  4. Supervision of other normal pregnancy, antepartum Will plan for IOL at 37 weeks Rec Tdap today, she will get at 36 weeks  5. Multigravida of advanced maternal age in third trimester  6. Obesity affecting pregnancy in second trimester, unspecified obesity type Cont baby aspirin   7. BMI 39.0-39.9,adult  8. History of severe pre-eclampsia  9. [redacted] weeks gestation of pregnancy   Preterm labor symptoms and general obstetric precautions including but not limited to vaginal bleeding, contractions, leaking of fluid and fetal movement were reviewed in detail with the patient. Please refer to After Visit Summary for other counseling recommendations.   Return in about 2 weeks (around 11/19/2023) for 36 week swabs.  Future Appointments  Date Time Provider Department Center  11/05/2023  1:50 PM Rasch, [name], NP [facility]  11/11/2023  1:15 PM [facility] PROVIDER 1 [facility]  11/11/2023  1:30 PM [facility] US4 [facility]  11/12/2023  4:10 PM Rasch, [name], NP [facility]  11/14/2023  3:55 PM Zina Jerilynn LABOR, MD [facility]  11/18/2023  1:15 PM [facility] PROVIDER 1 [facility]  11/18/2023  1:30 PM [facility] US4 [facility]  11/19/2023  3:10 PM Rasch, [name],  NP [facility]  11/25/2023  1:15 PM [facility] PROVIDER 1 [facility]  11/25/2023  1:30 PM [facility] US4 [facility]  11/26/2023  4:10 PM Rasch, [name], NP [facility]  11/27/2023  1:55 PM Ilean Norleen GAILS, MD [facility]   12/03/2023  4:10 PM Rasch, [name], NP [facility]  12/04/2023  3:15 PM Ilean Norleen GAILS, MD [facility]  12/11/2023  2:35 PM Izell Harari, MD [facility]  12/18/2023  2:35 PM Zina Jerilynn LABOR, MD [facility]  12/25/2023  2:35 PM Zina Jerilynn LABOR, MD [facility]    Burnard CHRISTELLA Moats, MD

## 2023-11-11 ENCOUNTER — Ambulatory Visit: Attending: Obstetrics and Gynecology

## 2023-11-11 ENCOUNTER — Ambulatory Visit (HOSPITAL_BASED_OUTPATIENT_CLINIC_OR_DEPARTMENT_OTHER): Admitting: Obstetrics and Gynecology

## 2023-11-11 VITALS — BP 124/75 | HR 113

## 2023-11-11 DIAGNOSIS — O24119 Pre-existing diabetes mellitus, type 2, in pregnancy, unspecified trimester: Secondary | ICD-10-CM | POA: Insufficient documentation

## 2023-11-11 DIAGNOSIS — E6689 Other obesity not elsewhere classified: Secondary | ICD-10-CM | POA: Diagnosis not present

## 2023-11-11 DIAGNOSIS — O99213 Obesity complicating pregnancy, third trimester: Secondary | ICD-10-CM | POA: Insufficient documentation

## 2023-11-11 DIAGNOSIS — Z3A34 34 weeks gestation of pregnancy: Secondary | ICD-10-CM | POA: Insufficient documentation

## 2023-11-11 DIAGNOSIS — O24113 Pre-existing diabetes mellitus, type 2, in pregnancy, third trimester: Secondary | ICD-10-CM | POA: Insufficient documentation

## 2023-11-11 DIAGNOSIS — O10013 Pre-existing essential hypertension complicating pregnancy, third trimester: Secondary | ICD-10-CM

## 2023-11-11 DIAGNOSIS — O09523 Supervision of elderly multigravida, third trimester: Secondary | ICD-10-CM

## 2023-11-11 DIAGNOSIS — Z6839 Body mass index (BMI) 39.0-39.9, adult: Secondary | ICD-10-CM | POA: Diagnosis not present

## 2023-11-11 DIAGNOSIS — E119 Type 2 diabetes mellitus without complications: Secondary | ICD-10-CM | POA: Diagnosis not present

## 2023-11-11 DIAGNOSIS — O10919 Unspecified pre-existing hypertension complicating pregnancy, unspecified trimester: Secondary | ICD-10-CM | POA: Insufficient documentation

## 2023-11-11 DIAGNOSIS — O99013 Anemia complicating pregnancy, third trimester: Secondary | ICD-10-CM

## 2023-11-11 DIAGNOSIS — D563 Thalassemia minor: Secondary | ICD-10-CM

## 2023-11-11 NOTE — Progress Notes (Signed)
 Maternal-Fetal Medicine Consultation  Name: Joanne Waller  MRN: [id number]  GA: H4E7977 [redacted]w[redacted]d   Patient is here for antenatal testing (BPP).  -Type 2 diabetes. Patient takes semiglee (insulin  glargine) 45 units in the morning and 45 units in the evening. She takes novolog  35/30/30 units with meals. She reports her fasting levels are below 95 mg/dL and postprandial levels range 120 to 130 mg/dL.   -Chronic hypertension. Patient takes nifedipine  XL 60 mg daily. Blood pressure today at our office is 124/75 mm Hg. She does not have symptoms and signs of severe features of preeclampsia. She takes low-dose aspirin  prophylaxis.  Ultrasound Normal amniotic fluid.  Cephalic presentation.  Antenatal testing is reassuring.  BPP 8/8. We reassured the patient of the findings.  Type 2 diabetes Patient reports some hypoglycemic episodes at night.  I discussed the corrective measures for hypoglycemia and encouraged her to keep glucose tablets or snacks with her.  It appears that her diabetes is better controlled now.  Hypertension is well-controlled with nifedipine .  Time of delivery-patient prefers to be delivered at [redacted] weeks gestation.  Comorbid conditions of diabetes and hypertension increases risk of perinatal mortality.  It is reasonable to consider delivery at 37-or 38-weeks' gestation.  Recommendations -Continue weekly antenatal testing till delivery.      Consultation including face-to-face (more than 50%) counseling 20 minutes.

## 2023-11-12 ENCOUNTER — Telehealth: Admitting: Obstetrics and Gynecology

## 2023-11-12 DIAGNOSIS — O24113 Pre-existing diabetes mellitus, type 2, in pregnancy, third trimester: Secondary | ICD-10-CM

## 2023-11-12 NOTE — Progress Notes (Signed)
 TELEHEALTH OBSTETRICS VISIT ENCOUNTER NOTE  FOR DIABETES MANAGEMENT DURING PREGNANCY    Provider location: [facility] for [facility]   Patient location: Home  I connected withNAME@ on 11/12/23 at  4:10 PM EDT by telephone at home and verified that I am speaking with the correct person using two identifiers. Of note, unable to do video encounter due to technical difficulties.    I discussed the limitations, risks, security and privacy concerns of performing an evaluation and management service by telephone and the availability of in person appointments. I also discussed with the patient that there may be a patient responsible charge related to this service. The patient expressed understanding and agreed to proceed.   History:   Joanne Waller is a 38 y.o. H4E7977 at [redacted]w[redacted]d being seen today for diabetes management during pregnancy.   Patient reports no complaints. Reports fetal movement. Denies any contractions, bleeding or leaking of fluid.   The following portions of the patient's history were reviewed and updated as appropriate: allergies, current medications, past family history, past medical history, past social history, past surgical history and problem list.   Past Medical History:  Diagnosis Date   Closed avulsion fracture of left ankle 08/14/2019   Depression    Diabetes mellitus without complication (HCC)    Hypertension    Pyelonephritis    Renal disorder    pyelonephritis   Seizures (HCC)    none since age 25   Past Surgical History:  Procedure Laterality Date   CHOLECYSTECTOMY     DILATION AND CURETTAGE OF UTERUS     Family History  Problem Relation Age of Onset   Kidney disease Mother        kidney stones   Hypertension Mother    Sickle cell anemia Father    Cancer Maternal Grandmother    Diabetes Maternal Grandmother    Hyperlipidemia Maternal Grandmother    Social History   Tobacco Use   Smoking status: Former    Current packs/day:  0.00    Average packs/day: 0.5 packs/day for 7.0 years (3.5 ttl pk-yrs)    Types: Cigarettes    Start date: 06/23/2013    Quit date: 06/23/2020    Years since quitting: 3.3   Smokeless tobacco: Never  Vaping Use   Vaping status: Never Used  Substance Use Topics   Alcohol use: Not Currently    Comment: not while preg   Drug use: No   Allergies  Allergen Reactions   Amoxicillin Other (See Comments)    Pt stated, I get a severe blinding headache   Penicillins Other (See Comments)    Sharp pain in temple    Latex Itching, Swelling and Rash   Current Outpatient Medications on File Prior to Visit  Medication Sig Dispense Refill   Accu-Chek Softclix Lancets lancets 1 each by Other route 4 (four) times daily. 100 each 2   acetaminophen  (TYLENOL ) 325 MG tablet Take 2 tablets (650 mg total) by mouth every 4 (four) hours as needed for moderate pain (for pain scale < 4).     aspirin  81 MG chewable tablet Chew 2 tablets (162 mg total) by mouth daily. 60 tablet 8   Blood Glucose Monitoring Suppl (ACCU-CHEK GUIDE) w/Device KIT 1 Device by Does not apply route as directed. 1 kit 0   Continuous Glucose Sensor (DEXCOM G7 SENSOR) MISC USE AS DIRECTED 3 each 10   ferrous sulfate  325 (65 FE) MG EC tablet Take 1 tablet (325 mg  total) by mouth 2 (two) times daily. (Patient not taking: Reported on 11/11/2023) 60 tablet 0   glucose blood test strip Check 4 x daily 100 each 2   insulin  aspart (NOVOLOG  FLEXPEN) 100 UNIT/ML FlexPen Inject 30 Units into the skin 3 (three) times daily with meals. (Patient taking differently: Inject 35 Units into the skin 3 (three) times daily with meals.) 15 mL 2   insulin  glargine-yfgn (SEMGLEE ) 100 UNIT/ML Pen Inject under the skin 45 units with breakfast and 35 units before bed (Patient taking differently: Inject under the skin 45 units with breakfast and 45 units before bed) 30 mL 6   metFORMIN  (GLUCOPHAGE ) 500 MG tablet Take 2 tablets (1,000 mg total) by mouth 2 (two) times  daily with a meal. 120 tablet 9   NIFEdipine  (PROCARDIA  XL/NIFEDICAL XL) 60 MG 24 hr tablet Take 1 tablet (60 mg total) by mouth daily. 90 tablet 3   ondansetron  (ZOFRAN -ODT) 4 MG disintegrating tablet Take 1 tablet (4 mg total) by mouth every 6 (six) hours as needed for nausea or vomiting. (Patient not taking: Reported on 11/05/2023) 20 tablet 0   [DISCONTINUED] ipratropium (ATROVENT ) 0.06 % nasal spray Place 2 sprays into both nostrils 4 (four) times daily. 15 mL 0   No current facility-administered medications on file prior to visit.      Objective:   General:  Alert, oriented and cooperative.   Mental Status: Normal mood and affect perceived. Normal judgment and thought content.   The Rest of physical exam deferred due to type of encounter  Hospital admissions related to diabetes? None recently    Type 2 DM  Follow up growth US  29%tile.  10/29/23 Next growth US  scheduled 11/25/23 Receiving weekly BPP's with MFM   A1c in the first trimester 8.3%   Most recent HgA1c: Ordered   Fetal Echo Normal.    PCP or endocrinologist.     Blood sugar monitoring: Dexcom in place with data sharing set up.    With CGM, target Blood sugar set at 65-140.   Currently glucose in target range is 53%  in target range    Back up glucose monitor at home.     Fasting BS 100's   2 hour PP mostly in range >120. Elevations noted predominantly after breakfast.      Current Diabetes medications:   Semglee  45 units qAm and 45 units qPM Novolog  35/30/30 Metformin  1000 mg BId   Plan:   -Continue Metformin  1000 mg BID   -With Dexcom, BS ranges should be set to 65-140 with a goal of 70% of BS within target range. She is currently at target 53% in range.  Making significant progress.    Increase breakfast Novolog  to 40 units Continue 30/30 units with lunch and dinner.   Increase PM semglee  to 52 units  Continue AM semglee  at 45 units.    -Recommend dosing 15- 20 minutes prior to eating.     -Avoid post meal coverage due hypoglycemia.    -Discussed only 30-40 grams of carbohydrates with each meal, with majority of your meals being protein and high fiber vegetables.    -Start a fiber supplement everyday.    -Protein snack before bed. Recommend 10-20 grams of protein before bed.    -Anticipate weekly diabetes visits until BS's are better controlled -Anticipate frequent in person OB visits along with MFM care.  -q4 week growth US  starting at 20w with antenatal weekly testing starting at 32 weeks or sooner if needed.    -  Continue BASA 81 mg      This visit is for the purposes of diabetes management only. Please keep scheduled OB visits with OB team for prenatal care.    Preterm labor symptoms and general obstetric precautions including but not limited to vaginal bleeding, contractions, leaking of fluid and fetal movement were reviewed in detail with the patient.  I discussed the assessment and treatment plan with the patient. The patient was provided an opportunity to ask questions and all were answered. The patient agreed with the plan and demonstrated an understanding of the instructions. The patient was advised to call back or seek an in-person office evaluation/go to MAU at [facility] for any urgent or concerning symptoms. Please refer to After Visit Summary for other counseling recommendations.    I provided 15 minutes of non-face-to-face time during this encounter.  Joanathan Affeldt, [name], NP Faculty Practice Center for [employer], [facility]

## 2023-11-13 DIAGNOSIS — Z0289 Encounter for other administrative examinations: Secondary | ICD-10-CM

## 2023-11-14 ENCOUNTER — Other Ambulatory Visit: Payer: Self-pay

## 2023-11-14 ENCOUNTER — Ambulatory Visit: Admitting: Obstetrics and Gynecology

## 2023-11-14 VITALS — BP 109/74 | HR 114 | Wt 303.1 lb

## 2023-11-14 DIAGNOSIS — Z3A34 34 weeks gestation of pregnancy: Secondary | ICD-10-CM

## 2023-11-14 DIAGNOSIS — Z3490 Encounter for supervision of normal pregnancy, unspecified, unspecified trimester: Secondary | ICD-10-CM

## 2023-11-14 DIAGNOSIS — O10913 Unspecified pre-existing hypertension complicating pregnancy, third trimester: Secondary | ICD-10-CM

## 2023-11-14 DIAGNOSIS — O09523 Supervision of elderly multigravida, third trimester: Secondary | ICD-10-CM

## 2023-11-14 DIAGNOSIS — O24113 Pre-existing diabetes mellitus, type 2, in pregnancy, third trimester: Secondary | ICD-10-CM | POA: Diagnosis not present

## 2023-11-14 DIAGNOSIS — Z6841 Body Mass Index (BMI) 40.0 and over, adult: Secondary | ICD-10-CM

## 2023-11-14 DIAGNOSIS — D563 Thalassemia minor: Secondary | ICD-10-CM

## 2023-11-14 DIAGNOSIS — Z3480 Encounter for supervision of other normal pregnancy, unspecified trimester: Secondary | ICD-10-CM

## 2023-11-14 NOTE — Progress Notes (Signed)
   PRENATAL VISIT NOTE  Subjective:  Joanne Waller is a 38 y.o. H4E7977 at [redacted]w[redacted]d being seen today for ongoing prenatal care.  She is currently monitored for the following issues for this high-risk pregnancy and has Pre-existing type 2 diabetes mellitus, in pregnancy, third trimester; Chronic hypertension complicating or reason for care during pregnancy, third trimester; Alpha thalassemia silent carrier; Supervision of other normal pregnancy, antepartum; AMA (advanced maternal age) multigravida 35+; Obesity affecting pregnancy; BMI 39.0-39.9,adult; History of severe pre-eclampsia; and BMI 40.0-44.9, adult (HCC) on their problem list.  Patient doing well with no acute concerns today. She reports no complaints.  Contractions: Irritability. Vag. Bleeding: None.  Movement: Present. Denies leaking of fluid.   The following portions of the patient's history were reviewed and updated as appropriate: allergies, current medications, past family history, past medical history, past social history, past surgical history and problem list. Problem list updated.  Objective:   Vitals:   11/14/23 1613  BP: 109/74  Pulse: (!) 114  Weight: (!) 303 lb 1.6 oz (137.5 kg)    Fetal Status: Fetal Heart Rate (bpm): 163 Fundal Height: 34 cm Movement: Present     General:  Alert, oriented and cooperative. Patient is in no acute distress.  Skin: Skin is warm and dry. No rash noted.   Cardiovascular: Normal heart rate noted  Respiratory: Normal respiratory effort, no problems with respiration noted  Abdomen: Soft, gravid, appropriate for gestational age.  Pain/Pressure: Present     Pelvic: Cervical exam deferred        Extremities: Normal range of motion.     Mental Status:  Normal mood and affect. Normal behavior. Normal judgment and thought content.   Assessment and Plan:  Pregnancy: H4E7977 at [redacted]w[redacted]d  1. [redacted] weeks gestation of pregnancy (Primary)   2. Chronic hypertension complicating or reason for care during  pregnancy, third trimester Blood pressure well controlled with meds  3. Pre-existing type 2 diabetes mellitus, in pregnancy, third trimester Reviewed note from J. Rasch, glucose control is improving , she remains in range at 53% with a goal of 70 % Novolog  40 units at breakfast and 30/30 for lunch/dinner Semglee  52 U in PM, 45 units in AM Recheck A1c today - HgB A1c  4. Supervision of other normal pregnancy, antepartum Continue routine prenatal care  5. BMI 40.0-44.9, adult (HCC)   6. Multigravida of advanced maternal age in third trimester   7. Alpha thalassemia silent carrier   Preterm labor symptoms and general obstetric precautions including but not limited to vaginal bleeding, contractions, leaking of fluid and fetal movement were reviewed in detail with the patient.  Please refer to After Visit Summary for other counseling recommendations.   Return in about 1 week (around 11/21/2023) for [facility], in person, 36 weeks swabs.   Jerilynn Buddle, MD Faculty Attending Center for [facility]

## 2023-11-15 ENCOUNTER — Ambulatory Visit: Payer: Self-pay | Admitting: Obstetrics and Gynecology

## 2023-11-15 LAB — HEMOGLOBIN A1C
Est. average glucose Bld gHb Est-mCnc: 180 mg/dL
Hgb A1c MFr Bld: 7.9 % — ABNORMAL HIGH (ref 4.8–5.6)

## 2023-11-18 ENCOUNTER — Ambulatory Visit: Attending: Obstetrics and Gynecology

## 2023-11-18 ENCOUNTER — Ambulatory Visit (HOSPITAL_BASED_OUTPATIENT_CLINIC_OR_DEPARTMENT_OTHER): Admitting: Obstetrics and Gynecology

## 2023-11-18 VITALS — BP 128/77 | HR 113

## 2023-11-18 DIAGNOSIS — E119 Type 2 diabetes mellitus without complications: Secondary | ICD-10-CM

## 2023-11-18 DIAGNOSIS — Z794 Long term (current) use of insulin: Secondary | ICD-10-CM

## 2023-11-18 DIAGNOSIS — O10919 Unspecified pre-existing hypertension complicating pregnancy, unspecified trimester: Secondary | ICD-10-CM | POA: Insufficient documentation

## 2023-11-18 DIAGNOSIS — Z3A35 35 weeks gestation of pregnancy: Secondary | ICD-10-CM | POA: Insufficient documentation

## 2023-11-18 DIAGNOSIS — E6689 Other obesity not elsewhere classified: Secondary | ICD-10-CM

## 2023-11-18 DIAGNOSIS — E669 Obesity, unspecified: Secondary | ICD-10-CM

## 2023-11-18 DIAGNOSIS — Z6839 Body mass index (BMI) 39.0-39.9, adult: Secondary | ICD-10-CM | POA: Insufficient documentation

## 2023-11-18 DIAGNOSIS — O99213 Obesity complicating pregnancy, third trimester: Secondary | ICD-10-CM | POA: Diagnosis not present

## 2023-11-18 DIAGNOSIS — O24113 Pre-existing diabetes mellitus, type 2, in pregnancy, third trimester: Secondary | ICD-10-CM | POA: Diagnosis not present

## 2023-11-18 DIAGNOSIS — O10013 Pre-existing essential hypertension complicating pregnancy, third trimester: Secondary | ICD-10-CM | POA: Insufficient documentation

## 2023-11-18 DIAGNOSIS — O09523 Supervision of elderly multigravida, third trimester: Secondary | ICD-10-CM | POA: Diagnosis not present

## 2023-11-18 DIAGNOSIS — Z7984 Long term (current) use of oral hypoglycemic drugs: Secondary | ICD-10-CM

## 2023-11-18 DIAGNOSIS — O24119 Pre-existing diabetes mellitus, type 2, in pregnancy, unspecified trimester: Secondary | ICD-10-CM | POA: Diagnosis not present

## 2023-11-18 NOTE — Progress Notes (Signed)
 Maternal-Fetal Medicine Consultation  Name: Joanne Waller  MRN: [id number]  GA: H4E7977 [redacted]w[redacted]d   Patient is here for BPP. - Type 2 diabetes.  Patient takes Semglee  (insulin  glargine) 52 units at night and 45 units in the morning.  She has Dexcom and reports that about 52% of her blood glucose levels are within normal range.  She forgot to take insulin  last night. She reports her fasting levels are between 93 and 120 mg/dL and postprandial levels between 120 and 150 mg/dL. Recent hemoglobin A1c was 7.9% (decreased from previous 8.3%). -Chronic hypertension.  Patient takes nifedipine  XL 60 mg daily.  Blood pressure today at our office is 128/77 mmHg.  She does not have symptoms of severe features of preeclampsia . Ultrasound Normal amniotic fluid.  Cephalic presentation.  BPP 8/8. I reassured the patient of the findings.  Type 2 diabetes I discussed the importance of good blood glucose control to prevent fetal and neonatal adverse outcomes.  Patient has an appointment with Delon Emms, NP to discuss diabetes and insulin  adjustments.  I encouraged her to follow the instructions to achieve good control. Antenatal testing has limitations in detecting fetal compromise. She will be undergoing induction of labor at [redacted] weeks gestation. Hypertension is well-controlled with nifedipine  and the patient does not have symptoms of severe features of preeclampsia.   Recommendations -BPP next week.     Consultation including face-to-face (more than 50%) counseling 20 minutes.

## 2023-11-19 ENCOUNTER — Telehealth: Admitting: Obstetrics and Gynecology

## 2023-11-19 DIAGNOSIS — O24113 Pre-existing diabetes mellitus, type 2, in pregnancy, third trimester: Secondary | ICD-10-CM | POA: Diagnosis not present

## 2023-11-19 DIAGNOSIS — O10013 Pre-existing essential hypertension complicating pregnancy, third trimester: Secondary | ICD-10-CM | POA: Diagnosis not present

## 2023-11-19 DIAGNOSIS — Z3A35 35 weeks gestation of pregnancy: Secondary | ICD-10-CM

## 2023-11-19 DIAGNOSIS — O10019 Pre-existing essential hypertension complicating pregnancy, unspecified trimester: Secondary | ICD-10-CM

## 2023-11-19 DIAGNOSIS — O24119 Pre-existing diabetes mellitus, type 2, in pregnancy, unspecified trimester: Secondary | ICD-10-CM

## 2023-11-19 MED ORDER — NOVOLOG FLEXPEN 100 UNIT/ML ~~LOC~~ SOPN: 115.0000 [IU] | PEN_INJECTOR | Freq: Three times a day (TID) | SUBCUTANEOUS | 3 refills | Status: DC

## 2023-11-19 MED ORDER — INSULIN GLARGINE-YFGN 100 UNIT/ML ~~LOC~~ SOPN: 110.0000 [IU] | PEN_INJECTOR | Freq: Every day | SUBCUTANEOUS | 6 refills | Status: DC

## 2023-11-19 MED ORDER — METFORMIN HCL 500 MG PO TABS: 1000.0000 mg | ORAL_TABLET | Freq: Two times a day (BID) | ORAL | 9 refills | Status: AC

## 2023-11-19 NOTE — Progress Notes (Signed)
 TELEHEALTH OBSTETRICS VISIT ENCOUNTER NOTE  FOR DIABETES MANAGEMENT DURING PREGNANCY    Provider location: Center for [facility]   Patient location: Home  I connected withNAME@ on 11/19/23 at  3:10 PM EDT by telephone at home and verified that I am speaking with the correct person using two identifiers. Of note, unable to do video encounter due to technical difficulties.    I discussed the limitations, risks, security and privacy concerns of performing an evaluation and management service by telephone and the availability of in person appointments. I also discussed with the patient that there may be a patient responsible charge related to this service. The patient expressed understanding and agreed to proceed.   History:   Joanne Waller is a 38 y.o. H4E7977 at [redacted]w[redacted]d being seen today for diabetes management during pregnancy.   Patient reports no complaints. Reports fetal movement. Denies any contractions, bleeding or leaking of fluid.   The following portions of the patient's history were reviewed and updated as appropriate: allergies, current medications, past family history, past medical history, past social history, past surgical history and problem list.   Past Medical History:  Diagnosis Date   Closed avulsion fracture of left ankle 08/14/2019   Depression    Diabetes mellitus without complication (HCC)    Hypertension    Pyelonephritis    Renal disorder    pyelonephritis   Seizures (HCC)    none since age 31   Past Surgical History:  Procedure Laterality Date   CHOLECYSTECTOMY     DILATION AND CURETTAGE OF UTERUS     Family History  Problem Relation Age of Onset   Kidney disease Mother        kidney stones   Hypertension Mother    Sickle cell anemia Father    Cancer Maternal Grandmother    Diabetes Maternal Grandmother    Hyperlipidemia Maternal Grandmother    Social History   Tobacco Use   Smoking status: Former    Current packs/day:  0.00    Average packs/day: 0.5 packs/day for 7.0 years (3.5 ttl pk-yrs)    Types: Cigarettes    Start date: 06/23/2013    Quit date: 06/23/2020    Years since quitting: 3.4   Smokeless tobacco: Never  Vaping Use   Vaping status: Never Used  Substance Use Topics   Alcohol use: Not Currently    Comment: not while preg   Drug use: No   Allergies  Allergen Reactions   Amoxicillin Other (See Comments)    Pt stated, I get a severe blinding headache   Penicillins Other (See Comments)    Sharp pain in temple    Latex Itching, Swelling and Rash   Current Outpatient Medications on File Prior to Visit  Medication Sig Dispense Refill   Accu-Chek Softclix Lancets lancets 1 each by Other route 4 (four) times daily. 100 each 2   acetaminophen  (TYLENOL ) 325 MG tablet Take 2 tablets (650 mg total) by mouth every 4 (four) hours as needed for moderate pain (for pain scale < 4).     aspirin  81 MG chewable tablet Chew 2 tablets (162 mg total) by mouth daily. 60 tablet 8   Blood Glucose Monitoring Suppl (ACCU-CHEK GUIDE) w/Device KIT 1 Device by Does not apply route as directed. 1 kit 0   Continuous Glucose Sensor (DEXCOM G7 SENSOR) MISC USE AS DIRECTED 3 each 10   ferrous sulfate  325 (65 FE) MG EC tablet Take 1 tablet (325 mg  total) by mouth 2 (two) times daily. (Patient not taking: Reported on 11/11/2023) 60 tablet 0   glucose blood test strip Check 4 x daily 100 each 2   insulin  aspart (NOVOLOG  FLEXPEN) 100 UNIT/ML FlexPen Inject 30 Units into the skin 3 (three) times daily with meals. 15 mL 2   insulin  glargine-yfgn (SEMGLEE ) 100 UNIT/ML Pen Inject under the skin 45 units with breakfast and 35 units before bed 30 mL 6   metFORMIN  (GLUCOPHAGE ) 500 MG tablet Take 2 tablets (1,000 mg total) by mouth 2 (two) times daily with a meal. 120 tablet 9   NIFEdipine  (PROCARDIA  XL/NIFEDICAL XL) 60 MG 24 hr tablet Take 1 tablet (60 mg total) by mouth daily. 90 tablet 3   [DISCONTINUED] ipratropium (ATROVENT ) 0.06  % nasal spray Place 2 sprays into both nostrils 4 (four) times daily. 15 mL 0   No current facility-administered medications on file prior to visit.      Objective:   General:  Alert, oriented and cooperative.   Mental Status: Normal mood and affect perceived. Normal judgment and thought content.   The Rest of physical exam deferred due to type of encounter  Type 2 DM   Induction scheduled for 37 weeks.    Most recent growth US  10/29/23 with EFW 29%tile with AC at 38%tile     Most recent HgA1c: 7.9% down from 8.3%   Fetal Echo normal.    Blood sugar monitoring - Dexcom in place with data sharing set up    With CGM, target Blood sugar set at 65-140.   Currently glucose in target range is 47% in target range- some missed insulin  doses.     Back up glucose monitor at home     Fasting BS 120's-130's   2 hour PP 140's-150's     Current Diabetes medications:  Novolog  40 units at breakfast and 30/30 for lunch/dinner Semglee  52 units in PM, 45 units in AM. Metformin  1000 mg twice per day   Plan:   Increase Novolog  to 45 units at breakfast    Increase Novolog  to 35/35 for lunch/dinner   Increase Semglee  60 units in PM,  Increase Semglee  to 50 units in AM   Continue Metformin  1000 mg BID   If fasting BS is still greater than 100 over the next 4-5 days increase semglee  by 5 units.    -Recommend dosing 15- 20 minutes prior to eating.    -Avoid post meal coverage due hypoglycemia.    -Discussed only 30-40 grams of carbohydrates with each meal, with majority of your meals being protein and high fiber vegetables.   -Protein snack before bed. Recommend 10-20 grams of protein before bed.    -Anticipate weekly diabetes visits until BS's are better controlled -Anticipate frequent in person OB visits along with MFM care.  -q4 week growth US  starting at 20w with antenatal weekly testing starting at 32 weeks or sooner if needed.    -Continue BASA 81 mg      This  visit is for the purposes of diabetes management only. Please keep scheduled OB visits with OB team for prenatal care.    Preterm labor symptoms and general obstetric precautions including but not limited to vaginal bleeding, contractions, leaking of fluid and fetal movement were reviewed in detail with the patient.  I discussed the assessment and treatment plan with the patient. The patient was provided an opportunity to ask questions and all were answered. The patient agreed with the plan and demonstrated an understanding  of the instructions. The patient was advised to call back or seek an in-person office evaluation/go to [facility]U at [facility] for any urgent or concerning symptoms. Please refer to After Visit Summary for other counseling recommendations.    I provided 15 minutes of non-face-to-face time during this encounter.   Ramez Arrona, [name], NP Faculty Practice Center for [employer], [facility]

## 2023-11-21 ENCOUNTER — Encounter: Payer: Self-pay | Admitting: Family Medicine

## 2023-11-21 ENCOUNTER — Other Ambulatory Visit (HOSPITAL_COMMUNITY)
Admission: RE | Admit: 2023-11-21 | Discharge: 2023-11-21 | Disposition: A | Discharge status: Home / Self Care | Source: Ambulatory Visit | Attending: Family Medicine | Admitting: Family Medicine

## 2023-11-21 ENCOUNTER — Other Ambulatory Visit: Payer: Self-pay

## 2023-11-21 ENCOUNTER — Ambulatory Visit (INDEPENDENT_AMBULATORY_CARE_PROVIDER_SITE_OTHER): Admitting: Family Medicine

## 2023-11-21 VITALS — BP 116/77 | HR 97 | Wt 307.1 lb

## 2023-11-21 DIAGNOSIS — Z3480 Encounter for supervision of other normal pregnancy, unspecified trimester: Secondary | ICD-10-CM | POA: Insufficient documentation

## 2023-11-21 DIAGNOSIS — O99213 Obesity complicating pregnancy, third trimester: Secondary | ICD-10-CM | POA: Diagnosis not present

## 2023-11-21 DIAGNOSIS — O24113 Pre-existing diabetes mellitus, type 2, in pregnancy, third trimester: Secondary | ICD-10-CM | POA: Diagnosis not present

## 2023-11-21 DIAGNOSIS — Z113 Encounter for screening for infections with a predominantly sexual mode of transmission: Secondary | ICD-10-CM | POA: Diagnosis not present

## 2023-11-21 DIAGNOSIS — Z8759 Personal history of other complications of pregnancy, childbirth and the puerperium: Secondary | ICD-10-CM

## 2023-11-21 DIAGNOSIS — O09523 Supervision of elderly multigravida, third trimester: Secondary | ICD-10-CM

## 2023-11-21 DIAGNOSIS — O99212 Obesity complicating pregnancy, second trimester: Secondary | ICD-10-CM

## 2023-11-21 DIAGNOSIS — Z3A35 35 weeks gestation of pregnancy: Secondary | ICD-10-CM | POA: Insufficient documentation

## 2023-11-21 DIAGNOSIS — O10913 Unspecified pre-existing hypertension complicating pregnancy, third trimester: Secondary | ICD-10-CM | POA: Diagnosis not present

## 2023-11-21 NOTE — Progress Notes (Signed)
   Subjective:  Joanne Waller is a 38 y.o. H4E7977 at [redacted]w[redacted]d being seen today for ongoing prenatal care.  She is currently monitored for the following issues for this high-risk pregnancy and has Pre-existing type 2 diabetes mellitus, in pregnancy, third trimester; Chronic hypertension complicating or reason for care during pregnancy, third trimester; Alpha thalassemia silent carrier; Supervision of other normal pregnancy, antepartum; AMA (advanced maternal age) multigravida 35+; Obesity affecting pregnancy; BMI 39.0-39.9,adult; History of severe pre-eclampsia; and BMI 40.0-44.9, adult (HCC) on their problem list.  Patient reports no complaints.  Contractions: Irritability. Vag. Bleeding: None.  Movement: Present. Denies leaking of fluid.   The following portions of the patient's history were reviewed and updated as appropriate: allergies, current medications, past family history, past medical history, past social history, past surgical history and problem list. Problem list updated.  Objective:   Vitals:   11/21/23 0944  BP: 116/77  Pulse: 97  Weight: (!) 307 lb 2 oz (139.3 kg)    Fetal Status: Fetal Heart Rate (bpm): 159   Movement: Present  Presentation: Vertex  General:  Alert, oriented and cooperative. Patient is in no acute distress.  Skin: Skin is warm and dry. No rash noted.   Cardiovascular: Normal heart rate noted  Respiratory: Normal respiratory effort, no problems with respiration noted  Abdomen: Soft, gravid, appropriate for gestational age. Pain/Pressure: Present     Pelvic: Vag. Bleeding: None     Cervical exam deferred Dilation: Fingertip Effacement (%): 50 Station: -3  Extremities: Normal range of motion.  Edema: Trace  Mental Status: Normal mood and affect. Normal behavior. Normal judgment and thought content.   Urinalysis:      Assessment and Plan:  Pregnancy: H4E7977 at [redacted]w[redacted]d  1. Supervision of other normal pregnancy, antepartum (Primary) BP and FHR normal Swabs  collected today Offered flu, would like to get it next week Discussed too late for RSV, baby should be offered after delivery Cervix checked at her request, vertex and FT Work letter provided per request  2. Pre-existing type 2 diabetes mellitus, in pregnancy, third trimester Seeing Jen Rasch, last seen two days prior and regimen adjusted, see her note for details She reports her numbers are improving, fasting was 93 this morning Fetal echo normal earlier in pregnancy Last growth US  10/29/2023, EFW 29% 1869g, AC 38%, AFI wnl Weekly BPPs Scheduled for IOL at [redacted]w[redacted]d, orders already placed  3. Obesity affecting pregnancy in second trimester, unspecified obesity type   4. History of severe pre-eclampsia Normotensive today, on ASA  5. Chronic hypertension complicating or reason for care during pregnancy, third trimester Normotensive on Nifedipine  60 daily  6. Multigravida of advanced maternal age in third trimester   Preterm labor symptoms and general obstetric precautions including but not limited to vaginal bleeding, contractions, leaking of fluid and fetal movement were reviewed in detail with the patient. Please refer to After Visit Summary for other counseling recommendations.  Return in 1 week (on 11/28/2023) for ob visit, [facility].   Lola Donnice HERO, MD

## 2023-11-21 NOTE — Patient Instructions (Signed)

## 2023-11-22 ENCOUNTER — Telehealth (HOSPITAL_COMMUNITY): Payer: Self-pay | Admitting: *Deleted

## 2023-11-22 LAB — CERVICOVAGINAL ANCILLARY ONLY
Chlamydia: NEGATIVE
Comment: NEGATIVE
Comment: NEGATIVE
Comment: NORMAL
Neisseria Gonorrhea: NEGATIVE
Trichomonas: NEGATIVE

## 2023-11-22 NOTE — Telephone Encounter (Signed)
 Preadmission screen

## 2023-11-24 LAB — CULTURE, BETA STREP (GROUP B ONLY): Strep Gp B Culture: POSITIVE — AB

## 2023-11-25 ENCOUNTER — Ambulatory Visit: Attending: Obstetrics and Gynecology

## 2023-11-25 ENCOUNTER — Ambulatory Visit: Payer: Self-pay | Admitting: Family Medicine

## 2023-11-25 ENCOUNTER — Ambulatory Visit (HOSPITAL_BASED_OUTPATIENT_CLINIC_OR_DEPARTMENT_OTHER): Admitting: Obstetrics

## 2023-11-25 DIAGNOSIS — E6689 Other obesity not elsewhere classified: Secondary | ICD-10-CM

## 2023-11-25 DIAGNOSIS — O24119 Pre-existing diabetes mellitus, type 2, in pregnancy, unspecified trimester: Secondary | ICD-10-CM | POA: Diagnosis present

## 2023-11-25 DIAGNOSIS — O10919 Unspecified pre-existing hypertension complicating pregnancy, unspecified trimester: Secondary | ICD-10-CM | POA: Insufficient documentation

## 2023-11-25 DIAGNOSIS — O24113 Pre-existing diabetes mellitus, type 2, in pregnancy, third trimester: Secondary | ICD-10-CM | POA: Diagnosis not present

## 2023-11-25 DIAGNOSIS — O99820 Streptococcus B carrier state complicating pregnancy: Secondary | ICD-10-CM | POA: Insufficient documentation

## 2023-11-25 DIAGNOSIS — O10013 Pre-existing essential hypertension complicating pregnancy, third trimester: Secondary | ICD-10-CM | POA: Diagnosis not present

## 2023-11-25 DIAGNOSIS — Z3A36 36 weeks gestation of pregnancy: Secondary | ICD-10-CM | POA: Diagnosis present

## 2023-11-25 DIAGNOSIS — O09523 Supervision of elderly multigravida, third trimester: Secondary | ICD-10-CM | POA: Diagnosis not present

## 2023-11-25 DIAGNOSIS — E669 Obesity, unspecified: Secondary | ICD-10-CM

## 2023-11-25 DIAGNOSIS — O10913 Unspecified pre-existing hypertension complicating pregnancy, third trimester: Secondary | ICD-10-CM | POA: Insufficient documentation

## 2023-11-25 DIAGNOSIS — O99213 Obesity complicating pregnancy, third trimester: Secondary | ICD-10-CM

## 2023-11-25 DIAGNOSIS — Z3480 Encounter for supervision of other normal pregnancy, unspecified trimester: Secondary | ICD-10-CM

## 2023-11-25 DIAGNOSIS — E119 Type 2 diabetes mellitus without complications: Secondary | ICD-10-CM | POA: Diagnosis not present

## 2023-11-25 NOTE — Progress Notes (Signed)
 MFM Consult Note  Joanne Waller is currently at 36 weeks and 1 day.  She has been followed due to advanced maternal age (38 years old), maternal obesity with a BMI of 39, pregestational diabetes treated with insulin  and metformin , and chronic hypertension treated with Procardia .   She denies any problems since her last exam and reports feeling fetal movements throughout the day.    She reports feeling contractions throughout the day.  Sonographic findings Single intrauterine pregnancy. Fetal cardiac activity: Observed. Presentation: Cephalic. Fetal biometry shows the estimated fetal weight of 6 pounds 4 ounces which measures at the 49th percentile. Amniotic fluid: Within normal limits.  AFI: 7.07 cm.  MVP: 2.95 cm.  The AFI is stable as compared to her last exam. Placenta: Anterior. BPP: 8/8.   There are limitations of prenatal ultrasound such as the inability to detect certain abnormalities due to poor visualization. Various factors such as fetal position, gestational age and maternal body habitus may increase the difficulty in visualizing the fetal anatomy.    Due to her frequent contractions, a digital exam was performed today showing that her cervix is closed and 50% effaced.  Due to her underlying medical conditions, she is already scheduled for induction next week on December 02, 2023 (at 37 weeks).  No further exams were scheduled in our office.    Labor precautions were reviewed today.  The patient stated that all of her questions were answered today.  A total of 10 minutes was spent counseling and coordinating the care for this patient.  Greater than 50% of the time was spent in direct face-to-face contact.

## 2023-11-26 ENCOUNTER — Telehealth (INDEPENDENT_AMBULATORY_CARE_PROVIDER_SITE_OTHER): Admitting: Obstetrics and Gynecology

## 2023-11-26 DIAGNOSIS — O0993 Supervision of high risk pregnancy, unspecified, third trimester: Secondary | ICD-10-CM | POA: Diagnosis not present

## 2023-11-26 DIAGNOSIS — Z3A36 36 weeks gestation of pregnancy: Secondary | ICD-10-CM | POA: Diagnosis not present

## 2023-11-26 DIAGNOSIS — O24113 Pre-existing diabetes mellitus, type 2, in pregnancy, third trimester: Secondary | ICD-10-CM

## 2023-11-26 DIAGNOSIS — E119 Type 2 diabetes mellitus without complications: Secondary | ICD-10-CM

## 2023-11-26 NOTE — Progress Notes (Signed)
 TELEHEALTH OBSTETRICS VISIT ENCOUNTER NOTE  FOR DIABETES MANAGEMENT DURING PREGNANCY    Provider location: Center for [facility] at [place]   Patient location: Home  I connected with Joanne Waller on 11/26/23 at  4:10 PM EDT by telephone at home and verified that I am speaking with the correct person using two identifiers. Of note, unable to do video encounter due to technical difficulties.    I discussed the limitations, risks, security and privacy concerns of performing an evaluation and management service by telephone and the availability of in person appointments. I also discussed with the patient that there may be a patient responsible charge related to this service. The patient expressed understanding and agreed to proceed.   History:   Joanne Waller is a 38 y.o. H4E7977 at [redacted]w[redacted]d by LMP being seen today for diabetes management during pregnancy.   Patient reports no complaints. Reports fetal movement. Denies any contractions, bleeding or leaking of fluid.   The following portions of the patient's history were reviewed and updated as appropriate: allergies, current medications, past family history, past medical history, past social history, past surgical history and problem list.           Past Medical History:  Diagnosis Date   Closed avulsion fracture of left ankle 08/14/2019   Depression    Diabetes mellitus without complication (HCC)    Hypertension    Pyelonephritis    Renal disorder    pyelonephritis   Seizures (HCC)    none since age 40   Past Surgical History:  Procedure Laterality Date   CHOLECYSTECTOMY     DILATION AND CURETTAGE OF UTERUS     Family History  Problem Relation Age of Onset   Kidney disease Mother        kidney stones   Hypertension Mother    Sickle cell anemia Father    Cancer Maternal Grandmother    Diabetes Maternal Grandmother    Hyperlipidemia Maternal Grandmother    Social History   Tobacco Use   Smoking status:  Former    Current packs/day: 0.00    Average packs/day: 0.5 packs/day for 7.0 years (3.5 ttl pk-yrs)    Types: Cigarettes    Start date: 06/23/2013    Quit date: 06/23/2020    Years since quitting: 3.4   Smokeless tobacco: Never  Vaping Use   Vaping status: Never Used  Substance Use Topics   Alcohol use: Not Currently    Comment: not while preg   Drug use: No   Allergies  Allergen Reactions   Amoxicillin Other (See Comments)    Pt stated, I get a severe blinding headache   Penicillins Other (See Comments)    Sharp pain in temple    Latex Itching, Swelling and Rash   Current Outpatient Medications on File Prior to Visit  Medication Sig Dispense Refill   Accu-Chek Softclix Lancets lancets 1 each by Other route 4 (four) times daily. 100 each 2   acetaminophen  (TYLENOL ) 325 MG tablet Take 2 tablets (650 mg total) by mouth every 4 (four) hours as needed for moderate pain (for pain scale < 4).     aspirin  81 MG chewable tablet Chew 2 tablets (162 mg total) by mouth daily. 60 tablet 8   Blood Glucose Monitoring Suppl (ACCU-CHEK GUIDE) w/Device KIT 1 Device by Does not apply route as directed. 1 kit 0   Continuous Glucose Sensor (DEXCOM G7 SENSOR) MISC USE AS DIRECTED 3 each 10  ferrous sulfate  325 (65 FE) MG EC tablet Take 1 tablet (325 mg total) by mouth 2 (two) times daily. (Patient not taking: Reported on 11/21/2023) 60 tablet 0   glucose blood test strip Check 4 x daily 100 each 2   insulin  aspart (NOVOLOG  FLEXPEN) 100 UNIT/ML FlexPen Inject 115 Units into the skin 3 (three) times daily with meals. Inject 45 units before breakfast, 35 units before lunch and dinner.  Please give 30 day supply. 15 mL 3   insulin  glargine-yfgn (SEMGLEE ) 100 UNIT/ML Pen Inject 110 Units into the skin daily. Inject under the skin 50 units in the morning and 60 units before bed 30 mL 6   metFORMIN  (GLUCOPHAGE ) 500 MG tablet Take 2 tablets (1,000 mg total) by mouth 2 (two) times daily with a meal. 120 tablet  9   NIFEdipine  (PROCARDIA  XL/NIFEDICAL XL) 60 MG 24 hr tablet Take 1 tablet (60 mg total) by mouth daily. 90 tablet 3   [DISCONTINUED] ipratropium (ATROVENT ) 0.06 % nasal spray Place 2 sprays into both nostrils 4 (four) times daily. 15 mL 0   No current facility-administered medications on file prior to visit.      Objective:   General:  Alert, oriented and cooperative.   Mental Status: Normal mood and affect perceived. Normal judgment and thought content.   The Rest of physical exam deferred due to type of encounter    Hospital admissions related to diabetes? No    Type 2 DM  Blood sugar monitoring: has Dexcom.    With CGM, target Blood sugar set at 65-140.   Currently glucose in target range is 48%  in target range 1% very high.    Back up glucose monitor     Fasting BS 120-130's   2 hour PP mostly >120 with some within normal range.      Current Diabetes medications:  Novolog  40 units at breakfast and 30/30 for lunch/dinner Semglee  60 units in PM, 50 units in AM. Metformin  1000 mg twice per day     Plan:  Snacking around 0500> recommend giving 4 units of Novolog  prior to snack. Continue Novolog  30 units with lunch and dinner.  Increase breakfast Novolog  to 45 units   Increase Semglee  to 70 units qPM Continue 50 units q AM   -Recommend Novolog  dosing 15- 20 minutes prior to eating.    -Avoid post meal coverage due hypoglycemia.    -Discussed only 30-40 grams of carbohydrates with each meal, with majority of your meals being protein and high fiber vegetables.    -Start a fiber supplement everyday.    -Protein snack before bed. Recommend 10-20 grams of protein before bed.    -Anticipate weekly diabetes visits until BS's are better controlled -Anticipate frequent in person OB visits along with MFM care.  -q4 week growth US  starting at 20w with antenatal weekly testing starting at 32 weeks or sooner if needed.    -Continue BASA 81 mg      This visit  is for the purposes of diabetes management only. Please keep scheduled OB visits with OB team for prenatal care.    Preterm labor symptoms and general obstetric precautions including but not limited to vaginal bleeding, contractions, leaking of fluid and fetal movement were reviewed in detail with the patient.  I discussed the assessment and treatment plan with the patient. The patient was provided an opportunity to ask questions and all were answered. The patient agreed with the plan and demonstrated an understanding of the instructions.  The patient was advised to call back or seek an in-person office evaluation/go to MAU at [facility] for any urgent or concerning symptoms. Please refer to After Visit Summary for other counseling recommendations.    I provided 15 minutes of non-face-to-face time during this encounter.      Jermarion Poffenberger, [name], NP    Faculty Practice Center for [employer], [facility]

## 2023-11-27 ENCOUNTER — Other Ambulatory Visit: Payer: Self-pay

## 2023-11-27 ENCOUNTER — Telehealth (HOSPITAL_COMMUNITY): Payer: Self-pay | Admitting: *Deleted

## 2023-11-27 ENCOUNTER — Encounter (HOSPITAL_COMMUNITY): Payer: Self-pay | Admitting: *Deleted

## 2023-11-27 ENCOUNTER — Ambulatory Visit (INDEPENDENT_AMBULATORY_CARE_PROVIDER_SITE_OTHER): Admitting: Family Medicine

## 2023-11-27 VITALS — BP 115/81 | HR 103 | Wt 302.6 lb

## 2023-11-27 DIAGNOSIS — Z3A36 36 weeks gestation of pregnancy: Secondary | ICD-10-CM

## 2023-11-27 DIAGNOSIS — O24113 Pre-existing diabetes mellitus, type 2, in pregnancy, third trimester: Secondary | ICD-10-CM

## 2023-11-27 DIAGNOSIS — O09523 Supervision of elderly multigravida, third trimester: Secondary | ICD-10-CM

## 2023-11-27 DIAGNOSIS — Z8759 Personal history of other complications of pregnancy, childbirth and the puerperium: Secondary | ICD-10-CM | POA: Diagnosis not present

## 2023-11-27 DIAGNOSIS — O99212 Obesity complicating pregnancy, second trimester: Secondary | ICD-10-CM

## 2023-11-27 DIAGNOSIS — O99820 Streptococcus B carrier state complicating pregnancy: Secondary | ICD-10-CM | POA: Diagnosis not present

## 2023-11-27 DIAGNOSIS — Z3480 Encounter for supervision of other normal pregnancy, unspecified trimester: Secondary | ICD-10-CM

## 2023-11-27 DIAGNOSIS — O99213 Obesity complicating pregnancy, third trimester: Secondary | ICD-10-CM | POA: Diagnosis not present

## 2023-11-27 DIAGNOSIS — O10913 Unspecified pre-existing hypertension complicating pregnancy, third trimester: Secondary | ICD-10-CM

## 2023-11-27 NOTE — Telephone Encounter (Signed)
 Preadmission screen

## 2023-11-27 NOTE — Progress Notes (Signed)
 PRENATAL VISIT NOTE  Subjective:  Joanne Waller is a 38 y.o. H4E7977 at [redacted]w[redacted]d being seen today for ongoing prenatal care.  She is currently monitored for the following issues for this high-risk pregnancy and has Pre-existing type 2 diabetes mellitus, in pregnancy, third trimester; Chronic hypertension complicating or reason for care during pregnancy, third trimester; Alpha thalassemia silent carrier; Supervision of other normal pregnancy, antepartum; AMA (advanced maternal age) multigravida 35+; Obesity affecting pregnancy; BMI 39.0-39.9,adult; History of severe pre-eclampsia; BMI 40.0-44.9, adult (HCC); and GBS (group B Streptococcus carrier), +RV culture, currently pregnant on their problem list.  Patient reports no bleeding, no contractions, no cramping, and no leaking.  Contractions: Irritability. Vag. Bleeding: None.  Movement: Present. Denies leaking of fluid.   The following portions of the patient's history were reviewed and updated as appropriate: allergies, current medications, past family history, past medical history, past social history, past surgical history and problem list.   Objective:    Vitals:   11/27/23 1404  BP: 115/81  Pulse: (!) 103  Weight: (!) 302 lb 9.6 oz (137.3 kg)    Fetal Status:  Fetal Heart Rate (bpm): 150   Movement: Present    General: Alert, oriented and cooperative. Patient is in no acute distress.  Skin: Skin is warm and dry. No rash noted.   Cardiovascular: Normal heart rate noted  Respiratory: Normal respiratory effort, no problems with respiration noted  Abdomen: Soft, gravid, appropriate for gestational age.  Pain/Pressure: Present     Pelvic: Cervical exam deferred        Extremities: Normal range of motion.  Edema: Trace  Mental Status: Normal mood and affect. Normal behavior. Normal judgment and thought content.   Assessment and Plan:  Pregnancy: H4E7977 at [redacted]w[redacted]d 1. Supervision of other normal pregnancy, antepartum (Primary) FHR BP  appropriate today  2. GBS (group B Streptococcus carrier), +RV culture, currently pregnant On the prophylaxis in labor  3. Pre-existing type 2 diabetes mellitus, in pregnancy, third trimester CBGs improving.  Dexcom showing average CBG 143.  Roughly 50% in range.  Follows with Wyvonna Emms Currently on 70 units long-acting at night and 50 units in the morning.  She also takes 35 units of short acting insulin  in the morning and 30 units with lunch and dinner.  If she wakes up in the melanite she takes 4 to 6 units. EFW on 10/20 of 2834 g, 49th percentile, cephalic Induction of labor scheduled at 37 weeks 1 day  4. Obesity affecting pregnancy in second trimester, unspecified obesity type   5. History of severe pre-eclampsia BP within normal limits  6. Chronic hypertension complicating or reason for care during pregnancy, third trimester Normotensive on aspirin  and Procardia  60 mg daily  7. Multigravida of advanced maternal age in third trimester  8. [redacted] weeks gestation of pregnancy   Preterm labor symptoms and general obstetric precautions including but not limited to vaginal bleeding, contractions, leaking of fluid and fetal movement were reviewed in detail with the patient. Please refer to After Visit Summary for other counseling recommendations.   No follow-ups on file.  Future Appointments  Date Time Provider Department Center  12/02/2023  7:00 AM [facility] SCHED ROOM [facility] None  12/04/2023  3:15 PM Ilean Norleen GAILS, MD [facility]  12/10/2023 10:50 AM Rasch, [name], NP [facility]  12/11/2023  2:35 PM Izell Harari, MD [facility]  12/18/2023  2:35 PM Zina Jerilynn LABOR, MD [facility]  12/25/2023  2:35 PM Zina Jerilynn LABOR, MD  [facility]    Norleen LULLA Rover, MD

## 2023-11-28 ENCOUNTER — Encounter: Payer: Self-pay | Admitting: Family Medicine

## 2023-12-02 ENCOUNTER — Encounter (HOSPITAL_COMMUNITY): Payer: Self-pay | Admitting: Obstetrics and Gynecology

## 2023-12-02 ENCOUNTER — Inpatient Hospital Stay (HOSPITAL_COMMUNITY)

## 2023-12-02 ENCOUNTER — Inpatient Hospital Stay (HOSPITAL_COMMUNITY): Admitting: Anesthesiology

## 2023-12-02 ENCOUNTER — Other Ambulatory Visit: Payer: Self-pay

## 2023-12-02 ENCOUNTER — Inpatient Hospital Stay (HOSPITAL_COMMUNITY)
Admission: AD | Admit: 2023-12-02 | Discharge: 2023-12-04 | DRG: 806 | Disposition: A | Discharge status: Home / Self Care | Payer: Self-pay | Attending: Obstetrics & Gynecology | Admitting: Obstetrics & Gynecology

## 2023-12-02 DIAGNOSIS — Z3A37 37 weeks gestation of pregnancy: Secondary | ICD-10-CM | POA: Diagnosis not present

## 2023-12-02 DIAGNOSIS — O24113 Pre-existing diabetes mellitus, type 2, in pregnancy, third trimester: Secondary | ICD-10-CM

## 2023-12-02 DIAGNOSIS — O26893 Other specified pregnancy related conditions, third trimester: Secondary | ICD-10-CM | POA: Diagnosis not present

## 2023-12-02 DIAGNOSIS — Z7984 Long term (current) use of oral hypoglycemic drugs: Secondary | ICD-10-CM

## 2023-12-02 DIAGNOSIS — O24424 Gestational diabetes mellitus in childbirth, insulin controlled: Secondary | ICD-10-CM | POA: Diagnosis not present

## 2023-12-02 DIAGNOSIS — Z148 Genetic carrier of other disease: Secondary | ICD-10-CM | POA: Diagnosis not present

## 2023-12-02 DIAGNOSIS — E119 Type 2 diabetes mellitus without complications: Secondary | ICD-10-CM | POA: Diagnosis not present

## 2023-12-02 DIAGNOSIS — O2412 Pre-existing diabetes mellitus, type 2, in childbirth: Secondary | ICD-10-CM | POA: Diagnosis not present

## 2023-12-02 DIAGNOSIS — Z880 Allergy status to penicillin: Secondary | ICD-10-CM

## 2023-12-02 DIAGNOSIS — Z79899 Other long term (current) drug therapy: Secondary | ICD-10-CM

## 2023-12-02 DIAGNOSIS — Z87891 Personal history of nicotine dependence: Secondary | ICD-10-CM | POA: Diagnosis not present

## 2023-12-02 DIAGNOSIS — O99214 Obesity complicating childbirth: Secondary | ICD-10-CM | POA: Diagnosis present

## 2023-12-02 DIAGNOSIS — Z794 Long term (current) use of insulin: Secondary | ICD-10-CM | POA: Diagnosis not present

## 2023-12-02 DIAGNOSIS — O1092 Unspecified pre-existing hypertension complicating childbirth: Secondary | ICD-10-CM | POA: Diagnosis not present

## 2023-12-02 DIAGNOSIS — Z8249 Family history of ischemic heart disease and other diseases of the circulatory system: Secondary | ICD-10-CM | POA: Diagnosis not present

## 2023-12-02 DIAGNOSIS — Z2882 Immunization not carried out because of caregiver refusal: Secondary | ICD-10-CM | POA: Diagnosis not present

## 2023-12-02 DIAGNOSIS — Z833 Family history of diabetes mellitus: Secondary | ICD-10-CM | POA: Diagnosis not present

## 2023-12-02 DIAGNOSIS — O99824 Streptococcus B carrier state complicating childbirth: Secondary | ICD-10-CM | POA: Diagnosis not present

## 2023-12-02 DIAGNOSIS — O164 Unspecified maternal hypertension, complicating childbirth: Secondary | ICD-10-CM | POA: Diagnosis not present

## 2023-12-02 DIAGNOSIS — Z7982 Long term (current) use of aspirin: Secondary | ICD-10-CM

## 2023-12-02 DIAGNOSIS — Z3490 Encounter for supervision of normal pregnancy, unspecified, unspecified trimester: Secondary | ICD-10-CM

## 2023-12-02 DIAGNOSIS — O99820 Streptococcus B carrier state complicating pregnancy: Secondary | ICD-10-CM | POA: Diagnosis not present

## 2023-12-02 DIAGNOSIS — O24414 Gestational diabetes mellitus in pregnancy, insulin controlled: Principal | ICD-10-CM | POA: Diagnosis present

## 2023-12-02 DIAGNOSIS — Z0542 Observation and evaluation of newborn for suspected metabolic condition ruled out: Secondary | ICD-10-CM | POA: Diagnosis not present

## 2023-12-02 DIAGNOSIS — O09523 Supervision of elderly multigravida, third trimester: Secondary | ICD-10-CM | POA: Diagnosis not present

## 2023-12-02 LAB — GLUCOSE, CAPILLARY
Glucose-Capillary: 150 mg/dL — ABNORMAL HIGH (ref 70–99)
Glucose-Capillary: 120 mg/dL — ABNORMAL HIGH (ref 70–99)
Glucose-Capillary: 119 mg/dL — ABNORMAL HIGH (ref 70–99)
Glucose-Capillary: 125 mg/dL — ABNORMAL HIGH (ref 70–99)
Glucose-Capillary: 84 mg/dL (ref 70–99)
Glucose-Capillary: 87 mg/dL (ref 70–99)
Glucose-Capillary: 91 mg/dL (ref 70–99)
Glucose-Capillary: 142 mg/dL — ABNORMAL HIGH (ref 70–99)
Glucose-Capillary: 163 mg/dL — ABNORMAL HIGH (ref 70–99)
Glucose-Capillary: 98 mg/dL (ref 70–99)
Glucose-Capillary: 193 mg/dL — ABNORMAL HIGH (ref 70–99)

## 2023-12-02 LAB — COMPREHENSIVE METABOLIC PANEL WITH GFR
ALT: 15 U/L (ref 0–44)
AST: 17 U/L (ref 15–41)
Albumin: 2.3 g/dL — ABNORMAL LOW (ref 3.5–5.0)
Alkaline Phosphatase: 107 U/L (ref 38–126)
Anion gap: 13 (ref 5–15)
BUN: 8 mg/dL (ref 6–20)
CO2: 20 mmol/L — ABNORMAL LOW (ref 22–32)
Calcium: 9 mg/dL (ref 8.9–10.3)
Chloride: 101 mmol/L (ref 98–111)
Creatinine, Ser: 0.71 mg/dL (ref 0.44–1.00)
GFR, Estimated: >60 mL/min (ref >60)
Glucose, Bld: 137 mg/dL — ABNORMAL HIGH (ref 70–99)
Potassium: 3.9 mmol/L (ref 3.5–5.1)
Sodium: 134 mmol/L — ABNORMAL LOW (ref 135–145)
Total Bilirubin: 0.2 mg/dL (ref 0.0–1.2)
Total Protein: 6.1 g/dL — ABNORMAL LOW (ref 6.5–8.1)

## 2023-12-02 LAB — CBC
HCT: 33.4 % — ABNORMAL LOW (ref 36.0–46.0)
HCT: 33.3 % — ABNORMAL LOW (ref 36.0–46.0)
Hemoglobin: 11.1 g/dL — ABNORMAL LOW (ref 12.0–15.0)
Hemoglobin: 11.1 g/dL — ABNORMAL LOW (ref 12.0–15.0)
MCH: 26.5 pg (ref 26.0–34.0)
MCH: 26.4 pg (ref 26.0–34.0)
MCHC: 33.2 g/dL (ref 30.0–36.0)
MCHC: 33.3 g/dL (ref 30.0–36.0)
MCV: 79.7 fL — ABNORMAL LOW (ref 80.0–100.0)
MCV: 79.3 fL — ABNORMAL LOW (ref 80.0–100.0)
Platelets: 328 K/uL (ref 150–400)
Platelets: 279 K/uL (ref 150–400)
RBC: 4.19 MIL/uL (ref 3.87–5.11)
RBC: 4.2 MIL/uL (ref 3.87–5.11)
RDW: 14 % (ref 11.5–15.5)
RDW: 14 % (ref 11.5–15.5)
WBC: 9.1 K/uL (ref 4.0–10.5)
WBC: 7.9 K/uL (ref 4.0–10.5)
nRBC: 0 % (ref 0.0–0.2)
nRBC: 0 % (ref 0.0–0.2)

## 2023-12-02 LAB — BASIC METABOLIC PANEL WITH GFR
Anion gap: 13 (ref 5–15)
BUN: 10 mg/dL (ref 6–20)
CO2: 19 mmol/L — ABNORMAL LOW (ref 22–32)
Calcium: 8.7 mg/dL — ABNORMAL LOW (ref 8.9–10.3)
Chloride: 100 mmol/L (ref 98–111)
Creatinine, Ser: 0.63 mg/dL (ref 0.44–1.00)
GFR, Estimated: >60 mL/min (ref >60)
Glucose, Bld: 119 mg/dL — ABNORMAL HIGH (ref 70–99)
Potassium: 4.2 mmol/L (ref 3.5–5.1)
Sodium: 132 mmol/L — ABNORMAL LOW (ref 135–145)

## 2023-12-02 LAB — TYPE AND SCREEN
ABO/RH(D): B POS
Antibody Screen: NEGATIVE

## 2023-12-02 LAB — RPR: RPR Ser Ql: NONREACTIVE

## 2023-12-02 LAB — PROTEIN / CREATININE RATIO, URINE
Creatinine, Urine: 105 mg/dL
Protein Creatinine Ratio: 0.14 mg/mg{creat} (ref 0.00–0.15)
Total Protein, Urine: 15 mg/dL

## 2023-12-02 MED ORDER — PRENATAL MULTIVITAMIN CH
1.0000 | ORAL_TABLET | Freq: Every day | ORAL | Status: DC
  Administered 2023-12-03: 1 via ORAL
  Filled 2023-12-02: qty 1

## 2023-12-02 MED ORDER — LACTATED RINGERS IV SOLN
INTRAVENOUS | Status: DC
Start: 2023-12-02 — End: 2023-12-02

## 2023-12-02 MED ORDER — FENTANYL CITRATE (PF) 100 MCG/2ML IJ SOLN: 50.0000 ug | INTRAMUSCULAR | Status: DC | PRN

## 2023-12-02 MED ORDER — PHENYLEPHRINE 80 MCG/ML (10ML) SYRINGE FOR IV PUSH (FOR BLOOD PRESSURE SUPPORT): 80.0000 ug | PREFILLED_SYRINGE | INTRAVENOUS | Status: DC | PRN

## 2023-12-02 MED ORDER — DEXTROSE IN LACTATED RINGERS 5 % IV SOLN: INTRAVENOUS | Status: DC

## 2023-12-02 MED ORDER — LACTATED RINGERS IV SOLN: 500.0000 mL | INTRAVENOUS | Status: DC | PRN

## 2023-12-02 MED ORDER — COCONUT OIL OIL: 1.0000 | TOPICAL_OIL | Status: DC | PRN

## 2023-12-02 MED ORDER — EPHEDRINE 5 MG/ML INJ: 10.0000 mg | INTRAVENOUS | Status: DC | PRN

## 2023-12-02 MED ORDER — OXYCODONE-ACETAMINOPHEN 5-325 MG PO TABS: 1.0000 | ORAL_TABLET | ORAL | Status: DC | PRN

## 2023-12-02 MED ORDER — CEFAZOLIN SODIUM-DEXTROSE 1-4 GM/50ML-% IV SOLN
1.0000 g | Freq: Three times a day (TID) | INTRAVENOUS | Status: DC
  Administered 2023-12-02: 1 g via INTRAVENOUS
  Filled 2023-12-02: qty 50

## 2023-12-02 MED ORDER — DEXTROSE 50 % IV SOLN: 0.0000 mL | INTRAVENOUS | Status: DC | PRN

## 2023-12-02 MED ORDER — TETANUS-DIPHTH-ACELL PERTUSSIS 5-2-15.5 LF-MCG/0.5 IM SUSP: 0.5000 mL | Freq: Once | INTRAMUSCULAR | Status: DC

## 2023-12-02 MED ORDER — SIMETHICONE 80 MG PO CHEW: 80.0000 mg | CHEWABLE_TABLET | ORAL | Status: DC | PRN

## 2023-12-02 MED ORDER — SOD CITRATE-CITRIC ACID 500-334 MG/5ML PO SOLN: 30.0000 mL | ORAL | Status: DC | PRN

## 2023-12-02 MED ORDER — ACETAMINOPHEN 325 MG PO TABS
650.0000 mg | ORAL_TABLET | ORAL | Status: DC | PRN
Start: 2023-12-02 — End: 2023-12-02

## 2023-12-02 MED ORDER — OXYCODONE HCL 5 MG PO TABS: 5.0000 mg | ORAL_TABLET | Freq: Four times a day (QID) | ORAL | Status: DC | PRN

## 2023-12-02 MED ORDER — DIPHENHYDRAMINE HCL 25 MG PO CAPS: 25.0000 mg | ORAL_CAPSULE | Freq: Four times a day (QID) | ORAL | Status: DC | PRN

## 2023-12-02 MED ORDER — INSULIN REGULAR(HUMAN) IN NACL 100-0.9 UT/100ML-% IV SOLN
INTRAVENOUS | Status: DC
Start: 2023-12-02 — End: 2023-12-02
  Administered 2023-12-02: 2.6 [IU]/h via INTRAVENOUS
  Filled 2023-12-02 (×2): qty 100

## 2023-12-02 MED ORDER — LACTATED RINGERS IV SOLN: 500.0000 mL | Freq: Once | INTRAVENOUS | Status: DC

## 2023-12-02 MED ORDER — LIDOCAINE HCL (PF) 1 % IJ SOLN
INTRAMUSCULAR | Status: DC | PRN
  Administered 2023-12-02: 5 mL via EPIDURAL

## 2023-12-02 MED ORDER — INSULIN ASPART 100 UNIT/ML IJ SOLN
0.0000 [IU] | Freq: Three times a day (TID) | INTRAMUSCULAR | Status: DC
  Administered 2023-12-03 (×2): 4 [IU] via SUBCUTANEOUS
  Administered 2023-12-03: 3 [IU] via SUBCUTANEOUS
  Administered 2023-12-04: 4 [IU] via SUBCUTANEOUS

## 2023-12-02 MED ORDER — OXYTOCIN-SODIUM CHLORIDE 30-0.9 UT/500ML-% IV SOLN
2.5000 [IU]/h | INTRAVENOUS | Status: DC
Start: 2023-12-02 — End: 2023-12-02
  Administered 2023-12-02: 2.5 [IU]/h via INTRAVENOUS

## 2023-12-02 MED ORDER — OXYTOCIN BOLUS FROM INFUSION
333.0000 mL | Freq: Once | INTRAVENOUS | Status: AC
  Administered 2023-12-02: 333 mL via INTRAVENOUS

## 2023-12-02 MED ORDER — INSULIN GLARGINE-YFGN 100 UNIT/ML ~~LOC~~ SOLN
20.0000 [IU] | Freq: Every morning | SUBCUTANEOUS | Status: DC
  Administered 2023-12-03 – 2023-12-04 (×2): 20 [IU] via SUBCUTANEOUS
  Filled 2023-12-02 (×2): qty 0.2

## 2023-12-02 MED ORDER — WITCH HAZEL-GLYCERIN EX PADS: 1.0000 | MEDICATED_PAD | CUTANEOUS | Status: DC | PRN

## 2023-12-02 MED ORDER — ONDANSETRON HCL 4 MG PO TABS: 4.0000 mg | ORAL_TABLET | ORAL | Status: DC | PRN

## 2023-12-02 MED ORDER — SODIUM CHLORIDE 0.9% FLUSH
3.0000 mL | INTRAVENOUS | Status: DC | PRN
Start: 2023-12-02 — End: 2023-12-04
  Administered 2023-12-04: 3 mL via INTRAVENOUS

## 2023-12-02 MED ORDER — SODIUM CHLORIDE 0.9 % IV SOLN: 250.0000 mL | INTRAVENOUS | Status: DC | PRN

## 2023-12-02 MED ORDER — ONDANSETRON HCL 4 MG/2ML IJ SOLN: 4.0000 mg | Freq: Four times a day (QID) | INTRAMUSCULAR | Status: DC | PRN

## 2023-12-02 MED ORDER — IBUPROFEN 800 MG PO TABS
800.0000 mg | ORAL_TABLET | Freq: Three times a day (TID) | ORAL | Status: DC
  Administered 2023-12-02 – 2023-12-04 (×5): 800 mg via ORAL
  Filled 2023-12-02 (×5): qty 1

## 2023-12-02 MED ORDER — DIBUCAINE (PERIANAL) 1 % EX OINT: 1.0000 | TOPICAL_OINTMENT | CUTANEOUS | Status: DC | PRN

## 2023-12-02 MED ORDER — NIFEDIPINE ER OSMOTIC RELEASE 30 MG PO TB24
60.0000 mg | ORAL_TABLET | Freq: Every day | ORAL | Status: DC
  Administered 2023-12-02 – 2023-12-04 (×3): 60 mg via ORAL
  Filled 2023-12-02 (×3): qty 2

## 2023-12-02 MED ORDER — ACETAMINOPHEN 325 MG PO TABS
650.0000 mg | ORAL_TABLET | ORAL | Status: DC | PRN
  Administered 2023-12-04: 650 mg via ORAL
  Filled 2023-12-02: qty 2

## 2023-12-02 MED ORDER — FUROSEMIDE 20 MG PO TABS
20.0000 mg | ORAL_TABLET | Freq: Every day | ORAL | Status: DC
  Administered 2023-12-03 – 2023-12-04 (×2): 20 mg via ORAL
  Filled 2023-12-02 (×2): qty 1

## 2023-12-02 MED ORDER — TERBUTALINE SULFATE 1 MG/ML IJ SOLN
0.2500 mg | Freq: Once | INTRAMUSCULAR | Status: DC | PRN
Start: 2023-12-02 — End: 2023-12-02

## 2023-12-02 MED ORDER — OXYTOCIN-SODIUM CHLORIDE 30-0.9 UT/500ML-% IV SOLN
1.0000 m[IU]/min | INTRAVENOUS | Status: DC
  Administered 2023-12-02: 2 m[IU]/min via INTRAVENOUS
  Filled 2023-12-02: qty 500

## 2023-12-02 MED ORDER — DIPHENHYDRAMINE HCL 50 MG/ML IJ SOLN
12.5000 mg | INTRAMUSCULAR | Status: DC | PRN
  Administered 2023-12-02 (×2): 12.5 mg via INTRAVENOUS
  Filled 2023-12-02: qty 1

## 2023-12-02 MED ORDER — FENTANYL-BUPIVACAINE-NACL 0.5-0.125-0.9 MG/250ML-% EP SOLN
12.0000 mL/h | EPIDURAL | Status: DC | PRN
  Administered 2023-12-02: 12 mL/h via EPIDURAL
  Filled 2023-12-02: qty 250

## 2023-12-02 MED ORDER — SENNOSIDES-DOCUSATE SODIUM 8.6-50 MG PO TABS
2.0000 | ORAL_TABLET | ORAL | Status: DC
  Administered 2023-12-03 – 2023-12-04 (×2): 2 via ORAL
  Filled 2023-12-02 (×2): qty 2

## 2023-12-02 MED ORDER — MEASLES, MUMPS & RUBELLA VAC ~~LOC~~ SUSR: 0.5000 mL | Freq: Once | SUBCUTANEOUS | Status: DC

## 2023-12-02 MED ORDER — CEFAZOLIN SODIUM-DEXTROSE 2-4 GM/100ML-% IV SOLN
2.0000 g | Freq: Once | INTRAVENOUS | Status: AC
  Administered 2023-12-02: 2 g via INTRAVENOUS
  Filled 2023-12-02: qty 100

## 2023-12-02 MED ORDER — LIDOCAINE HCL (PF) 1 % IJ SOLN
30.0000 mL | INTRAMUSCULAR | Status: DC | PRN
Start: 2023-12-02 — End: 2023-12-02

## 2023-12-02 MED ORDER — BENZOCAINE-MENTHOL 20-0.5 % EX AERO: 1.0000 | INHALATION_SPRAY | CUTANEOUS | Status: DC | PRN

## 2023-12-02 MED ORDER — MISOPROSTOL 25 MCG QUARTER TABLET
25.0000 ug | ORAL_TABLET | Freq: Once | ORAL | Status: AC
  Administered 2023-12-02: 25 ug via VAGINAL
  Filled 2023-12-02: qty 1

## 2023-12-02 MED ORDER — CEFAZOLIN SODIUM-DEXTROSE 1-4 GM/50ML-% IV SOLN: 1.0000 g | Freq: Three times a day (TID) | INTRAVENOUS | Status: DC

## 2023-12-02 MED ORDER — METFORMIN HCL 500 MG PO TABS
1000.0000 mg | ORAL_TABLET | Freq: Two times a day (BID) | ORAL | Status: DC
  Administered 2023-12-03 – 2023-12-04 (×3): 1000 mg via ORAL
  Filled 2023-12-02 (×3): qty 2

## 2023-12-02 MED ORDER — SODIUM CHLORIDE 0.9% FLUSH
3.0000 mL | Freq: Two times a day (BID) | INTRAVENOUS | Status: DC
  Administered 2023-12-04 (×2): 3 mL via INTRAVENOUS

## 2023-12-02 MED ORDER — MISOPROSTOL 50MCG HALF TABLET
50.0000 ug | ORAL_TABLET | Freq: Once | ORAL | Status: DC
Start: 2023-12-02 — End: 2023-12-02

## 2023-12-02 MED ORDER — CEFAZOLIN SODIUM-DEXTROSE 2-4 GM/100ML-% IV SOLN: 2.0000 g | Freq: Once | INTRAVENOUS | Status: DC

## 2023-12-02 MED ORDER — INSULIN GLARGINE-YFGN 100 UNIT/ML ~~LOC~~ SOLN
30.0000 [IU] | Freq: Every day | SUBCUTANEOUS | Status: DC
  Administered 2023-12-03: 30 [IU] via SUBCUTANEOUS
  Filled 2023-12-02 (×2): qty 0.3

## 2023-12-02 MED ORDER — ONDANSETRON HCL 4 MG/2ML IJ SOLN: 4.0000 mg | INTRAMUSCULAR | Status: DC | PRN

## 2023-12-02 NOTE — Inpatient Diabetes Management (Addendum)
 Inpatient Diabetes Program Recommendations  ADA Standards of Care 2025 Diabetes in Pregnancy Target Glucose Ranges:  Fasting: 70 - 95 mg/dL 1 hr postprandial:  889 - 140mg /dL (from first bite of meal) 2 hr postprandial:  100 - 120 mg/dL (from first bit of meal)     Lab Results  Component Value Date   GLUCAP 142 (H) 12/02/2023   HGBA1C 7.9 (H) 11/14/2023    Review of Glycemic Control  Latest Reference Range & Units 12/02/23 04:50 12/02/23 07:01 12/02/23 07:32 12/02/23 08:43 12/02/23 09:41  Glucose-Capillary 70 - 99 mg/dL 874 (H) 84 87 91 857 (H)   Diabetes history: Type 2 DM Outpatient Diabetes medications:  Dexcom G7 Novolog  flexpen- 45 units with breakfast, 35 units with lunch and dinner Semglee  50 units in the AM and 60 units at HS Metformin  1000 mg bid  Note that prior to pregnancy pt. was taking: Toujeo  30 units daily Metformin  1000 mg bid Current orders for Inpatient glycemic control:  IV insulin    Inpatient Diabetes Program Recommendations:    Met with patient.  She is currently on IV insulin .  States that Dexcom fell off last night. I provided her with a sample Dexcom and patient applied to right arm.  Discussed with RN.  Will need to validate CBG's with Dexcom.  Recommend CBG's q 4 hours (with use of dexcom hourly) while on IV insulin .    Discussed pre-pregnancy DM management.  She states that she took Toujeo   30 units daily plus Metformin  prior to pregnancy.  -Once delivered, recommend Semglee  30 units daily plus Novolog  (0-15 units) q 4 hours.    Thanks,  Randall Bullocks, RN, BC-ADM Inpatient Diabetes Coordinator Pager [phone]

## 2023-12-02 NOTE — Lactation Note (Signed)
 This note was copied from a baby's chart. Lactation Consultation Note  Patient Name: Joanne Waller Today's Date: 12/02/2023 Age:36 hours   Mom chooses to formula feed.  Maternal Data    Feeding    LATCH Score                    Lactation Tools Discussed/Used    Interventions    Discharge    Consult Status Consult Status: Complete    Ka Flammer G 12/02/2023, 9:53 PM

## 2023-12-02 NOTE — Anesthesia Procedure Notes (Signed)
 Epidural Patient location during procedure: OB Start time: 12/02/2023 10:24 AM End time: 12/02/2023 10:36 AM  Staffing Anesthesiologist: Jefm Garnette LABOR, MD Performed: anesthesiologist   Preanesthetic Checklist Completed: patient identified, IV checked, site marked, risks and benefits discussed, surgical consent, monitors and equipment checked, pre-op evaluation and timeout performed  Epidural Patient position: sitting Prep: DuraPrep and site prepped and draped Patient monitoring: continuous pulse ox and blood pressure Approach: midline Location: L3-L4 Injection technique: LOR air  Needle:  Needle type: Tuohy  Needle gauge: 17 G Needle length: 9 cm and 9 Needle insertion depth: 9 cm Catheter type: closed end flexible Catheter size: 19 Gauge Catheter at skin depth: 15 cm Test dose: negative  Assessment Events: blood not aspirated, no cerebrospinal fluid, injection not painful, no injection resistance, no paresthesia and negative IV test  Additional Notes Patient identified. Risks/Benefits/Options discussed with patient including but not limited to bleeding, infection, nerve damage, paralysis, failed block, incomplete pain control, headache, blood pressure changes, nausea, vomiting, reactions to medication both or allergic, itching and postpartum back pain. Confirmed with bedside nurse the patient's most recent platelet count. Confirmed with patient that they are not currently taking any anticoagulation, have any bleeding history or any family history of bleeding disorders. Patient expressed understanding and wished to proceed. All questions were answered. Sterile technique was used throughout the entire procedure. Please see nursing notes for vital signs. Test dose was given through epidural needle and negative prior to continuing to dose epidural or start infusion. Warning signs of high block given to the patient including shortness of breath, tingling/numbness in hands, complete  motor block, or any concerning symptoms with instructions to call for help. Patient was given instructions on fall risk and not to get out of bed. All questions and concerns addressed with instructions to call with any issues.  1 Attempt (S) . Patient tolerated procedure well.

## 2023-12-02 NOTE — Discharge Summary (Shared)
 Postpartum Discharge Summary  Date of Service updated***     Patient Name: Joanne Waller DOB: 05-28-1985 MRN: [id number]  Date of admission: 12/02/2023 Delivery date:12/02/2023 Delivering provider:   Date of discharge: 12/02/2023  Admitting diagnosis: Insulin  controlled gestational diabetes mellitus (GDM) in third trimester [O24.414] Intrauterine pregnancy: [redacted]w[redacted]d     Secondary diagnosis:  Principal Problem:   Insulin  controlled gestational diabetes mellitus (GDM) in third trimester  Additional problems: ***    Discharge diagnosis: {DX.:23714}                                              Post partum procedures:{Postpartum procedures:23558} Augmentation: AROM, Pitocin , and Cytotec  Complications: None  Hospital course: Induction of Labor With Vaginal Delivery   38 y.o. yo H4E7977 at [redacted]w[redacted]d was admitted to the hospital 12/02/2023 for induction of labor.  Indication for induction: TYPE 2 DM.  Patient had an uncomplicated labor course. Membrane Rupture Time/Date: 11:56 AM,12/02/2023  Delivery Method:Vaginal, Spontaneous Operative Delivery:N/A Episiotomy: None Lacerations:    Details of delivery can be found in separate delivery note.  Patient had a postpartum course complicated by***. Patient is discharged home 12/02/23.  Newborn Data: Birth date:12/02/2023 Birth time:7:27 PM Gender:Female Living status:Living Apgars:8 ,9  Weight:   Magnesium  Sulfate received: {Mag received:30440022} BMZ received: No Rhophylac:N/A MMR:N/A T-DaP:*** Flu: No RSV Vaccine received: No Transfusion:{Transfusion received:30440034}  Immunizations received: Immunization History  Administered Date(s) Administered   Tdap 02/25/2021    Physical exam  Vitals:   12/02/23 1630 12/02/23 1700 12/02/23 1730 12/02/23 1800  BP: (!) 144/89 (!) 140/93 (!) 121/58 126/62  Pulse: 94 (!) 107 98 82  Resp:      Temp:      TempSrc:      SpO2:      Weight:      Height:       General: {Exam;  general:21111117} Lochia: {Desc; appropriate/inappropriate:30686::appropriate} Uterine Fundus: {Desc; firm/soft:30687} Incision: {Exam; incision:21111123} DVT Evaluation: {Exam; dvt:2111122} Labs: Lab Results  Component Value Date   WBC 7.9 12/02/2023   HGB 11.1 (L) 12/02/2023   HCT 33.3 (L) 12/02/2023   MCV 79.3 (L) 12/02/2023   PLT 279 12/02/2023      Latest Ref Rng & Units 12/02/2023    4:27 AM  CMP  Glucose 70 - 99 mg/dL 880   BUN 6 - 20 mg/dL 10   Creatinine 9.55 - 1.00 mg/dL 9.36   Sodium 864 - 854 mmol/L 132   Potassium 3.5 - 5.1 mmol/L 4.2   Chloride 98 - 111 mmol/L 100   CO2 22 - 32 mmol/L 19   Calcium 8.9 - 10.3 mg/dL 8.7    Edinburgh Score:    03/29/2021    4:23 PM  Edinburgh Postnatal Depression Scale Screening Tool  I have been able to laugh and see the funny side of things. 0   I have looked forward with enjoyment to things. 0   I have blamed myself unnecessarily when things went wrong. 0   I have been anxious or worried for no good reason. 0   I have felt scared or panicky for no good reason. 0   Things have been getting on top of me. 0   I have been so unhappy that I have had difficulty sleeping. 0   I have felt sad or miserable. 0   I have been  so unhappy that I have been crying. 0   The thought of harming myself has occurred to me. 0   Edinburgh Postnatal Depression Scale Total 0      Data saved with a previous flowsheet row definition   No data recorded  After visit meds:  Allergies as of 12/02/2023       Reactions   Amoxicillin Other (See Comments)   Pt stated, I get a severe blinding headache   Penicillins Other (See Comments)   Sharp pain in temple    Latex Itching, Swelling, Rash     Med Rec must be completed prior to using this SMARTLINK***        Discharge home in stable condition Infant Feeding: {Baby feeding:23562} Infant Disposition:{CHL IP OB HOME WITH FNUYZM:76418} Discharge instruction: per After Visit Summary and  Postpartum booklet. Activity: Advance as tolerated. Pelvic rest for 6 weeks.  Diet: {OB ipzu:78888878} Future Appointments: Future Appointments  Date Time Provider Department Center  12/10/2023 10:50 AM Rasch, [name], NP [facility]   Follow up Visit:   Please schedule this patient for a In person postpartum visit in 6 weeks with the following provider: Any provider. Additional Postpartum F/U:BP check 1 week  High risk pregnancy complicated by: T2DM Delivery mode:  Vaginal, Spontaneous Anticipated Birth Control:  Nexplanon   -Message sent to [facility] 10/27 - CC  12/02/2023 Colter LITTIE Angles, MD

## 2023-12-02 NOTE — H&P (Cosign Needed Addendum)
 OBSTETRIC ADMISSION HISTORY AND PHYSICAL  Joanne Waller is a 38 y.o. female [phone] with IUP at [redacted]w[redacted]d by US  7wks presenting for IOL. She reports +FMs, No LOF, no VB, no blurry vision, headaches or peripheral edema, and RUQ pain.  She plans on breast feeding. She request nexplanon  for birth control. She received her prenatal care at [facility]   Dating: By priscilla US  --->  Estimated Date of Delivery: 12/22/23  Sono:    @[redacted]w[redacted]d , CWD, normal anatomy, cephalic presentation, anterior placenta, 2834g, 49% EFW   Prenatal History/Complications: GBS Carrier, Pre-existing Type II DM, Obesity BMI 44, Hx Pre-E, cHTN (ASA, procardia  60 daily), AMA   Past Medical History: Past Medical History:  Diagnosis Date   Closed avulsion fracture of left ankle 08/14/2019   Depression    Diabetes mellitus without complication (HCC)    Hypertension    Pyelonephritis    Renal disorder    pyelonephritis   Seizures (HCC)    none since age 31    Past Surgical History: Past Surgical History:  Procedure Laterality Date   CHOLECYSTECTOMY     DILATION AND CURETTAGE OF UTERUS      Obstetrical History: OB History     Gravida  5   Para  2   Term  2   Preterm      AB  2   Living  2      SAB  1   IAB  1   Ectopic      Multiple  0   Live Births  2        Obstetric Comments  2023 Magnesium  for BP         Social History Social History   Socioeconomic History   Marital status: Single    Spouse name: Not on file   Number of children: Not on file   Years of education: Not on file   Highest education level: Not on file  Occupational History   Not on file  Tobacco Use   Smoking status: Former    Current packs/day: 0.00    Average packs/day: 0.5 packs/day for 7.0 years (3.5 ttl pk-yrs)    Types: Cigarettes    Start date: 06/23/2013    Quit date: 06/23/2020    Years since quitting: 3.4   Smokeless tobacco: Never  Vaping Use   Vaping status: Never Used  Substance and Sexual Activity    Alcohol use: Not Currently    Comment: not while preg   Drug use: No   Sexual activity: Yes    Partners: Male    Birth control/protection: None  Other Topics Concern   Not on file  Social History Narrative   Not on file   Social Drivers of Health   Financial Resource Strain: Not on file  Food Insecurity: No Food Insecurity (11/28/2020)   Hunger Vital Sign    Worried About Running Out of Food in the Last Year: Never true    Ran Out of Food in the Last Year: Never true  Transportation Needs: No Transportation Needs (11/28/2020)   PRAPARE - Administrator, Civil Service (Medical): No    Lack of Transportation (Non-Medical): No  Physical Activity: Not on file  Stress: Not on file  Social Connections: Unknown (04/06/2022)   Received from [facility]   Social Network    Social Network: Not on file    Family History: Family History  Problem Relation Age of Onset   Kidney disease Mother  kidney stones   Hypertension Mother    Sickle cell anemia Father    Cancer Maternal Grandmother    Diabetes Maternal Grandmother    Hyperlipidemia Maternal Grandmother     Allergies: Allergies  Allergen Reactions   Amoxicillin Other (See Comments)    Pt stated, I get a severe blinding headache   Penicillins Other (See Comments)    Sharp pain in temple    Latex Itching, Swelling and Rash    Medications Prior to Admission  Medication Sig Dispense Refill Last Dose/Taking   Accu-Chek Softclix Lancets lancets 1 each by Other route 4 (four) times daily. 100 each 2    acetaminophen  (TYLENOL ) 325 MG tablet Take 2 tablets (650 mg total) by mouth every 4 (four) hours as needed for moderate pain (for pain scale < 4).      aspirin  81 MG chewable tablet Chew 2 tablets (162 mg total) by mouth daily. 60 tablet 8    Blood Glucose Monitoring Suppl (ACCU-CHEK GUIDE) w/Device KIT 1 Device by Does not apply route as directed. 1 kit 0    Continuous Glucose Sensor (DEXCOM G7 SENSOR)  MISC USE AS DIRECTED 3 each 10    ferrous sulfate  325 (65 FE) MG EC tablet Take 1 tablet (325 mg total) by mouth 2 (two) times daily. 60 tablet 0    glucose blood test strip Check 4 x daily 100 each 2    insulin  aspart (NOVOLOG  FLEXPEN) 100 UNIT/ML FlexPen Inject 115 Units into the skin 3 (three) times daily with meals. Inject 45 units before breakfast, 35 units before lunch and dinner.  Please give 30 day supply. 15 mL 3    insulin  glargine-yfgn (SEMGLEE ) 100 UNIT/ML Pen Inject 110 Units into the skin daily. Inject under the skin 50 units in the morning and 60 units before bed 30 mL 6    metFORMIN  (GLUCOPHAGE ) 500 MG tablet Take 2 tablets (1,000 mg total) by mouth 2 (two) times daily with a meal. 120 tablet 9    NIFEdipine  (PROCARDIA  XL/NIFEDICAL XL) 60 MG 24 hr tablet Take 1 tablet (60 mg total) by mouth daily. 90 tablet 3      Review of Systems   All systems reviewed and negative except as stated in HPI  Blood pressure (!) 142/92, pulse (!) 126, temperature 98.1 F (36.7 C), temperature source Oral, resp. rate 16, height 5' 10 (1.778 m), weight (!) 139.1 kg, last menstrual period 03/11/2023, unknown if currently breastfeeding. General appearance: alert, cooperative, and appears stated age Lungs: clear to auscultation bilaterally Heart: regular rate and rhythm Abdomen: soft, non-tender; bowel sounds normal Pelvic: 1.5/thick/-3  Presentation: cephalic Fetal monitoringBaseline: 160 bpm, Variability: Good {> 6 bpm), Accelerations: nonereactive, and Decelerations: Absent Uterine activity: minimal     Prenatal labs: ABO, Rh: B/Positive/-- (04/23 1519) Antibody: Negative (04/23 1519) Rubella: 2.65 (04/23 1519) RPR: Non Reactive (08/27 1703)  HBsAg: Negative (04/23 1519)  HIV: Non Reactive (08/27 1703)  GBS: Positive/-- (10/16 1125)    Lab Results  Component Value Date   GBS Positive (A) 11/21/2023   GTT, not completed, known DM II Genetic screening: NIPS low risk, AFP  normal Anatomy US  completed, no concerns  Immunization History  Administered Date(s) Administered   Tdap 02/25/2021    Prenatal Transfer Tool  Maternal Diabetes: Yes:  Diabetes Type:  Pre-pregnancy DM II - 70u long actiing at night and 50 u in the morning, 35 short acting at breakfast and 30u at both lunch and dinner Genetic Screening: Normal  Maternal Ultrasounds/Referrals: Normal Fetal Ultrasounds or other Referrals:  Referred to Materal Fetal Medicine  Maternal Substance Abuse:  No Significant Maternal Medications:  Meds include: Other: insulin , procardia  Significant Maternal Lab Results: Other: GBS carrier Number of Prenatal Visits:greater than 3 verified prenatal visits Maternal Vaccinations: none Other Comments:  None   No results found for this or any previous visit (from the past 24 hours).  Patient Active Problem List   Diagnosis Date Noted   Insulin  controlled gestational diabetes mellitus (GDM) in third trimester 12/02/2023   GBS (group B Streptococcus carrier), +RV culture, currently pregnant 11/25/2023   BMI 40.0-44.9, adult (HCC) 11/14/2023   History of severe pre-eclampsia 06/19/2023   BMI 39.0-39.9,adult 05/21/2023   AMA (advanced maternal age) multigravida 35+ 07/26/2022   Obesity affecting pregnancy 07/26/2022   Supervision of other normal pregnancy, antepartum 05/07/2022   Alpha thalassemia silent carrier 10/14/2020   Pre-existing type 2 diabetes mellitus, in pregnancy, third trimester 08/15/2020   Chronic hypertension complicating or reason for care during pregnancy, third trimester 08/15/2020    Assessment/Plan:  Joanne Waller is a 38 y.o. H4E7977 at [redacted]w[redacted]d here for IOL.   #Labor: Patient dilated 1.5cm. Cervix thick but soft. Placed foley balloon, vaginal cytotec  #Pain: Fentanyl , Epidural #FWB: CAT I #GBS status:  Positive, penicillin allergy (non-anaphylactic), will treat with cefazolin IV during labor and delivery #Feeding: Breastmilk  #Reproductive  Life planning: Nexplanon , wants placed in hospital #Circ:  not applicable  #Type II DM, insulin  dependent: Will have patient on EndoTool while in labor. After labor, current regimen long acting insulin  50u in the morning and 70 in the evening and short acting 45 in the morning and 30 for lunch and at dinner. Also on metformin  1000mg  BID, will hold for now but can restart at lower dose after delivery.   #Hx PreE: preE labs pending, closely monitoring  #cHTN: Continue procardia  60mg  every morning  #AMA #Obesity  Lavanda FORBES Aline, MD  12/02/2023, 12:24 AM  GME ATTESTATION:  Evaluation and management procedures were performed by the [facility] Medicine Resident under my supervision. I was immediately available for direct supervision, assistance and direction throughout this encounter.  I also confirm that I have verified the information documented in the resident's note, and that I have also personally reperformed the pertinent components of the physical exam and all of the medical decision making activities.  I have also made any necessary editorial changes.  Leeroy KATHEE Pouch, MD OB Fellow, Faculty Practice [facility], Center for [facility] 12/02/2023 8:54 AM

## 2023-12-02 NOTE — Progress Notes (Addendum)
 Labor Progress Note Joanne Waller is a 38 y.o. H4E7977 at [redacted]w[redacted]d presented for IOL for T2DM.  S: Doing well.  O:  BP 126/62   Pulse 82   Temp 98 F (36.7 C) (Axillary)   Resp 16   Ht 5' 10 (1.778 m)   Wt (!) 139.1 kg   LMP 03/11/2023 (Exact Date)   SpO2 98%   BMI 44.01 kg/m  EFM: 150/Moderate Variability/Accelerations (+),Decelerations (-)  CVE: Dilation: 4.5 Effacement (%): 60 Station: -3 Presentation: Vertex Exam by:: Sheena, RN   A&P: 38 y.o. H4E7977 [redacted]w[redacted]d  #Labor: Progressing well. AROM performed, clear fluid  #Pain: Epidural in place #FWB: Category I #GBS positive -> Ancef #T2DM: Continue endotool.  #cHTN: BP's in mild range with one severe while on L&D. PIH labs negative. Continue Procardia  60mg  XR daily. Continue to monitor BP's.   Maykel Reitter LITTIE Angles, MD 6:16 PM

## 2023-12-02 NOTE — Progress Notes (Signed)
 Labor Progress Note Joanne Waller is a 38 y.o. H4E7977 at [redacted]w[redacted]d presented for IOL for T2DM.  S: Doing well.  O:  BP 126/62   Pulse 82   Temp 98 F (36.7 C) (Axillary)   Resp 16   Ht 5' 10 (1.778 m)   Wt (!) 139.1 kg   LMP 03/11/2023 (Exact Date)   SpO2 98%   BMI 44.01 kg/m  EFM: 145/Moderate Variability/Accelerations (-),Decelerations (-)  CVE: Dilation: 4.5 Effacement (%): 60 Station: -3 Presentation: Vertex Exam by:: Sheena, RN   A&P: 38 y.o. H4E7977 [redacted]w[redacted]d  #Labor: Has not made cervical change since last check. IUPC placed by nursing staff. Encouraged peanut ball use.  #Pain: Epidural in place #FWB: Category I #GBS positive -> Ancef #T2DM: Continue endotool.  #cHTN: BP's in mild range with one severe while on L&D. PIH labs negative. Continue Procardia  60mg  XR daily. Continue to monitor BP's.   Shaylin Blatt LITTIE Angles, MD 6:23 PM

## 2023-12-02 NOTE — Anesthesia Preprocedure Evaluation (Signed)
 Anesthesia Evaluation  Patient identified by MRN, date of birth, ID band Patient awake    Reviewed: Allergy & Precautions, NPO status , Patient's Chart, lab work & pertinent test results  Airway Mallampati: III  TM Distance: >3 FB Neck ROM: Full    Dental no notable dental hx. (+) Teeth Intact, Dental Advisory Given   Pulmonary former smoker   Pulmonary exam normal breath sounds clear to auscultation       Cardiovascular hypertension, Pt. on medications Normal cardiovascular exam Rhythm:Regular Rate:Normal     Neuro/Psych  PSYCHIATRIC DISORDERS  Depression       GI/Hepatic   Endo/Other  diabetes, Insulin  Dependent    Renal/GU Lab Results      Component                Value               Date                          K                        4.2                 12/02/2023                CO2                      19 (L)              12/02/2023                BUN                      10                  12/02/2023                CREATININE               0.63                12/02/2023                GFRNONAA                 >60                 12/02/2023                   Musculoskeletal   Abdominal   Peds  Hematology Lab Results      Component                Value               Date                      WBC                      7.9                 12/02/2023                HGB                      11.1 (L)  12/02/2023                HCT                      33.3 (L)            12/02/2023                MCV                      79.3 (L)            12/02/2023                PLT                      279                 12/02/2023              Anesthesia Other Findings All amoxicillin PCN Latex  Reproductive/Obstetrics (+) Pregnancy                              Anesthesia Physical Anesthesia Plan  ASA: 3  Anesthesia Plan: Epidural   Post-op Pain Management:    Induction:    PONV Risk Score and Plan:   Airway Management Planned:   Additional Equipment:   Intra-op Plan:   Post-operative Plan:   Informed Consent: I have reviewed the patients History and Physical, chart, labs and discussed the procedure including the risks, benefits and alternatives for the proposed anesthesia with the patient or authorized representative who has indicated his/her understanding and acceptance.       Plan Discussed with:   Anesthesia Plan Comments: (37.1wk  G5P2 w cHtn , DM2 on endo tool BMI 44 for LEA)        Anesthesia Quick Evaluation

## 2023-12-03 ENCOUNTER — Telehealth: Admitting: Obstetrics and Gynecology

## 2023-12-03 LAB — GLUCOSE, CAPILLARY
Glucose-Capillary: 157 mg/dL — ABNORMAL HIGH (ref 70–99)
Glucose-Capillary: 153 mg/dL — ABNORMAL HIGH (ref 70–99)

## 2023-12-03 NOTE — Anesthesia Postprocedure Evaluation (Signed)
 Anesthesia Post Note  Patient: Joanne Waller  Procedure(s) Performed: AN AD HOC LABOR EPIDURAL     Patient location during evaluation: Mother Baby Anesthesia Type: Epidural Level of consciousness: awake and alert and oriented Pain management: satisfactory to patient Vital Signs Assessment: post-procedure vital signs reviewed and stable Respiratory status: respiratory function stable Cardiovascular status: stable Postop Assessment: no headache, no backache, epidural receding, patient able to bend at knees, no signs of nausea or vomiting, adequate PO intake and able to ambulate Anesthetic complications: no   No notable events documented.  Last Vitals:  Vitals:   12/03/23 0230 12/03/23 0610  BP: 128/79 (!) 150/79  Pulse: 90 90  Resp: 18 18  Temp: 36.6 C 36.6 C  SpO2: 98% 99%    Last Pain:  Vitals:   12/03/23 0621  TempSrc:   PainSc: 0-No pain   Pain Goal:                   Christpoher Sievers

## 2023-12-03 NOTE — Inpatient Diabetes Management (Signed)
 Inpatient Diabetes Program Recommendations  AACE/ADA: New Consensus Statement on Inpatient Glycemic Control (2015)  Target Ranges:  Prepandial:   less than 140 mg/dL      Peak postprandial:   less than 180 mg/dL (1-2 hours)      Critically ill patients:  140 - 180 mg/dL   Lab Results  Component Value Date   GLUCAP 157 (H) 12/03/2023   HGBA1C 7.9 (H) 11/14/2023    Review of Glycemic Control  Latest Reference Range & Units 12/02/23 09:41 12/02/23 10:50 12/02/23 15:55 12/02/23 17:05 12/03/23 06:04  Glucose-Capillary 70 - 99 mg/dL 857 (H) 836 (H) 98 806 (H) 157 (H)   Diabetes history: DM  Outpatient Diabetes medications:  Outpatient Diabetes medications:  Dexcom G7 Novolog  flexpen- 45 units with breakfast, 35 units with lunch and dinner Semglee  50 units in the AM and 60 units at HS Metformin  1000 mg bid Note that prior to pregnancy pt. was taking: Toujeo  30 units daily Metformin  1000 mg bid Current orders for Inpatient glycemic control:  Novolog  0-20 units tid with meals Semglee  20 units q AM and Semglee  30 units q PM  Inpatient Diabetes Program Recommendations:    Consider reducing Semglee  to 30 units once daily (this was pre-pregnancy insulin  dose).   Thanks,  Randall Bullocks, RN, BC-ADM Inpatient Diabetes Coordinator Pager [phone]  (8a-5p)

## 2023-12-04 ENCOUNTER — Encounter: Admitting: Family Medicine

## 2023-12-04 ENCOUNTER — Telehealth: Payer: Self-pay

## 2023-12-04 ENCOUNTER — Other Ambulatory Visit (HOSPITAL_COMMUNITY): Payer: Self-pay

## 2023-12-04 MED ORDER — COCONUT OIL OIL: 1.0000 | TOPICAL_OIL | Status: DC | PRN

## 2023-12-04 MED ORDER — INSULIN ASPART 100 UNIT/ML IJ SOLN: 0.0000 [IU] | Freq: Three times a day (TID) | INTRAMUSCULAR | 11 refills | Status: DC

## 2023-12-04 MED ORDER — INSULIN PEN NEEDLE 32G X 4 MM MISC
0 refills | Status: AC
Start: 2023-12-04 — End: ?
  Filled 2023-12-04: qty 100, 25d supply, fill #0

## 2023-12-04 MED ORDER — FUROSEMIDE 20 MG PO TABS: 20.0000 mg | ORAL_TABLET | Freq: Every day | ORAL | 0 refills | Status: DC

## 2023-12-04 MED ORDER — INSULIN GLARGINE-YFGN 100 UNIT/ML ~~LOC~~ SOLN: 30.0000 [IU] | Freq: Every day | SUBCUTANEOUS | 11 refills | Status: DC

## 2023-12-04 MED ORDER — TETANUS-DIPHTH-ACELL PERTUSSIS 5-2-15.5 LF-MCG/0.5 IM SUSP: 0.5000 mL | Freq: Once | INTRAMUSCULAR | 0 refills | Status: DC

## 2023-12-04 MED ORDER — MEASLES, MUMPS & RUBELLA VAC ~~LOC~~ SUSR: 0.5000 mL | Freq: Once | SUBCUTANEOUS | 0 refills | Status: DC

## 2023-12-04 MED ORDER — FUROSEMIDE 20 MG PO TABS
20.0000 mg | ORAL_TABLET | Freq: Every day | ORAL | 0 refills | Status: DC
  Filled 2023-12-04 (×2): qty 4, 4d supply, fill #0

## 2023-12-04 MED ORDER — BENZOCAINE-MENTHOL 20-0.5 % EX AERO: 1.0000 | INHALATION_SPRAY | CUTANEOUS | Status: DC | PRN

## 2023-12-04 MED ORDER — PRENATAL MULTIVITAMIN CH: 1.0000 | ORAL_TABLET | Freq: Every day | ORAL | Status: DC

## 2023-12-04 MED ORDER — NOVOLOG FLEXPEN 100 UNIT/ML ~~LOC~~ SOPN
0.0000 [IU] | PEN_INJECTOR | Freq: Three times a day (TID) | SUBCUTANEOUS | 11 refills | Status: AC
  Filled 2023-12-04 (×2): qty 18, 30d supply, fill #0

## 2023-12-04 MED ORDER — IBUPROFEN 800 MG PO TABS
800.0000 mg | ORAL_TABLET | Freq: Three times a day (TID) | ORAL | 0 refills | Status: AC
  Filled 2023-12-04 (×3): qty 30, 10d supply, fill #0

## 2023-12-04 MED ORDER — INSULIN GLARGINE-YFGN 100 UNIT/ML ~~LOC~~ SOPN
30.0000 [IU] | PEN_INJECTOR | Freq: Every day | SUBCUTANEOUS | 11 refills | Status: AC
  Filled 2023-12-04 (×2): qty 9, 30d supply, fill #0

## 2023-12-04 MED ORDER — WITCH HAZEL-GLYCERIN EX PADS: 1.0000 | MEDICATED_PAD | CUTANEOUS | Status: DC | PRN

## 2023-12-04 MED ORDER — IBUPROFEN 800 MG PO TABS: 800.0000 mg | ORAL_TABLET | Freq: Three times a day (TID) | ORAL | 0 refills | Status: DC

## 2023-12-04 MED ORDER — DIBUCAINE (PERIANAL) 1 % EX OINT: 1.0000 | TOPICAL_OINTMENT | CUTANEOUS | Status: DC | PRN

## 2023-12-04 NOTE — Patient Instructions (Signed)
 If you are interested in an outpatient lactation consultation -- available in-office or virtually -- please reach out to us  at:  [facility] (First Floor) ?? [address], [place], [place]  ?? [phone] Please leave a message on our lactation voicemail box. We welcome any lactation-related questions or concerns -- our team is here to support you and your baby.  Lactation Support Groups Join us  at: [facility] ?? Tuesdays, 10:00 AM - 12:00 PM ?? [address], [place] [employer], [place]  Lactating parents and lap babies are welcome!  ?? [email or web address]  ?? [email or web address] -------------     Vermell CINDERELLA Pelt, IBCLC Center for [facility]

## 2023-12-04 NOTE — Telephone Encounter (Signed)
 Returned call to [employer].  Pharmacist stated that they received order for MMR and TDAP vaccine injections.  Confirmed with Dr. Ilean this was likely a mistake and those orders were meant to be inpatient postpartum orders.  Orders cancelled.    [place], RN

## 2023-12-04 NOTE — Telephone Encounter (Signed)
 [facility] left voicemail requesting a call back regarding questions about prescription for this pt.

## 2023-12-09 DIAGNOSIS — Z0289 Encounter for other administrative examinations: Secondary | ICD-10-CM

## 2023-12-10 ENCOUNTER — Telehealth: Admitting: Obstetrics and Gynecology

## 2023-12-10 ENCOUNTER — Telehealth: Payer: Self-pay | Admitting: *Deleted

## 2023-12-10 DIAGNOSIS — O24111 Pre-existing diabetes mellitus, type 2, in pregnancy, first trimester: Secondary | ICD-10-CM

## 2023-12-10 DIAGNOSIS — O2413 Pre-existing diabetes mellitus, type 2, in the puerperium: Secondary | ICD-10-CM | POA: Diagnosis not present

## 2023-12-10 MED ORDER — DEXCOM G7 SENSOR MISC: 10 refills | Status: DC

## 2023-12-10 NOTE — Progress Notes (Signed)
 TELEHEALTH OBSTETRICS VISIT ENCOUNTER NOTE  FOR DIABETES MANAGEMENT DURING PREGNANCY    Provider location: Center for [facility] [place]   Patient location: Home  I connected with Joanne Waller on 12/10/23 at 10:50 AM EST by telephone at home and verified that I am speaking with the correct person using two identifiers. Of note, unable to do video encounter due to technical difficulties.    I discussed the limitations, risks, security and privacy concerns of performing an evaluation and management service by telephone and the availability of in person appointments. I also discussed with the patient that there may be a patient responsible charge related to this service. The patient expressed understanding and agreed to proceed.   History:   Joanne Waller is a 38 y.o. [id number] S/P vaginal delivery on 12/02/23 due to uncontrolled DM and chronic hypertension. She is doing well.   Baby is doing well.  No NICU needed.  Infant weight 6 lbs 6 ounces.   She went home on BP medications, however is not checking her BP at home. No symptoms.   The following portions of the patient's history were reviewed and updated as appropriate: allergies, current medications, past family history, past medical history, past social history, past surgical history and problem list.   Past Medical History:  Diagnosis Date   Closed avulsion fracture of left ankle 08/14/2019   Depression    Diabetes mellitus without complication (HCC)    Hypertension    Pyelonephritis    Renal disorder    pyelonephritis   Seizures (HCC)    none since age 38   Past Surgical History:  Procedure Laterality Date   CHOLECYSTECTOMY     DILATION AND CURETTAGE OF UTERUS     Family History  Problem Relation Age of Onset   Kidney disease Mother        kidney stones   Hypertension Mother    Sickle cell anemia Father    Cancer Maternal Grandmother    Diabetes Maternal Grandmother    Hyperlipidemia Maternal  Grandmother    Social History   Tobacco Use   Smoking status: Former    Current packs/day: 0.00    Average packs/day: 0.5 packs/day for 7.0 years (3.5 ttl pk-yrs)    Types: Cigarettes    Start date: 06/23/2013    Quit date: 06/23/2020    Years since quitting: 3.4   Smokeless tobacco: Never  Vaping Use   Vaping status: Never Used  Substance Use Topics   Alcohol use: Not Currently    Comment: not while preg   Drug use: No   Allergies  Allergen Reactions   Amoxicillin Other (See Comments)    Pt stated, I get a severe blinding headache   Penicillins Other (See Comments)    Sharp pain in temple    Latex Itching, Swelling and Rash   Current Outpatient Medications on File Prior to Visit  Medication Sig Dispense Refill   Accu-Chek Softclix Lancets lancets 1 each by Other route 4 (four) times daily. 100 each 2   acetaminophen  (TYLENOL ) 325 MG tablet Take 2 tablets (650 mg total) by mouth every 4 (four) hours as needed for moderate pain (for pain scale < 4).     Blood Glucose Monitoring Suppl (ACCU-CHEK GUIDE) w/Device KIT 1 Device by Does not apply route as directed. 1 kit 0   Continuous Glucose Sensor (DEXCOM G7 SENSOR) MISC USE AS DIRECTED 3 each 10   glucose blood test strip Check 4  x daily 100 each 2   ibuprofen  (ADVIL ) 800 MG tablet Take 1 tablet (800 mg total) by mouth every 8 (eight) hours. 30 tablet 0   insulin  aspart (NOVOLOG  FLEXPEN) 100 UNIT/ML FlexPen Inject 0-20 Units into the skin 3 (three) times daily with meals. 18 mL 11   insulin  glargine-yfgn (SEMGLEE ) 100 UNIT/ML Pen Inject 30 Units into the skin at bedtime. 9 mL 11   Insulin  Pen Needle 32G X 4 MM MISC Use as directed with insulin  100 each 0   NIFEdipine  (PROCARDIA  XL/NIFEDICAL XL) 60 MG 24 hr tablet Take 1 tablet (60 mg total) by mouth daily. 90 tablet 3   benzocaine -Menthol  (DERMOPLAST) 20-0.5 % AERO Apply 1 Application topically as needed for irritation (perineal discomfort). (Patient not taking: Reported on  12/10/2023)     coconut oil OIL Apply 1 Application topically as needed. (Patient not taking: Reported on 12/10/2023)     dibucaine (NUPERCAINAL) 1 % OINT Place 1 Application rectally as needed for hemorrhoids. (Patient not taking: Reported on 12/10/2023)     furosemide  (LASIX ) 20 MG tablet Take 1 tablet (20 mg total) by mouth daily. (Patient not taking: Reported on 12/10/2023) 4 tablet 0   metFORMIN  (GLUCOPHAGE ) 500 MG tablet Take 2 tablets (1,000 mg total) by mouth 2 (two) times daily with a meal. 120 tablet 9   Prenatal Vit-Fe Fumarate-FA (PRENATAL MULTIVITAMIN) TABS tablet Take 1 tablet by mouth daily at 12 noon. (Patient not taking: Reported on 12/10/2023)     witch hazel-glycerin  (TUCKS) pad Apply 1 Application topically as needed for hemorrhoids. (Patient not taking: Reported on 12/10/2023)     [DISCONTINUED] ipratropium (ATROVENT ) 0.06 % nasal spray Place 2 sprays into both nostrils 4 (four) times daily. 15 mL 0   No current facility-administered medications on file prior to visit.   Objective:   General:  Alert, oriented and cooperative.   Mental Status: Normal mood and affect perceived. Normal judgment and thought content.   The Rest of physical exam deferred due to type of encounter  Dexcom CGM in place   Currently glucose in target range is 92% in target range  Fasting BS mostly 100's   Current Diabetes medications:   Semglee  30 units at bedtime Metformin  1000 mg qAM Novolog  using sliding scale.   Plan:   -Continue Metformin  and Novolog  sliding scare -Increase BS goals to 70-180 - Increase Semglee  to 35 units q PM - Schedule PCP follow up  - Will need in person BP check with OB office- Epic message sent.    -Recommend dosing 15- 20 minutes prior to eating.    -Avoid post meal coverage due hypoglycemia.    -Discussed only 30-40 grams of carbohydrates with each meal, with majority of your meals being protein and high fiber vegetables.    -Start a fiber supplement  everyday.    -Protein snack before bed. Recommend 10-20 grams of protein before bed.     I provided 15 minutes of non-face-to-face time during this encounter.   Joanne Waller, Joanne FERNS, NP Faculty Practice Center for [employer], [facility]

## 2023-12-10 NOTE — Telephone Encounter (Signed)
-----   Message from Graford C sent at 12/03/2023  8:04 AM EDT ----- Regarding: FW: PP Appointment Good morning ----- Message ----- From: Magali Barkley CROME, MD Sent: 12/02/2023   8:02 PM EDT To: [facility] Subject: PP Appointment                                 Follow up Visit:   Please schedule this patient for a In person postpartum visit in 6 weeks with the following provider: Any provider. Additional Postpartum F/U:BP check 1 week  High risk pregnancy complicated by:  T2DM Delivery mode: Vaginal, Spontaneous Anticipated Birth Control: Nexplanon   Thanks!

## 2023-12-10 NOTE — Telephone Encounter (Signed)
Left patient a message to call and schedule. 

## 2023-12-11 ENCOUNTER — Encounter: Admitting: Obstetrics and Gynecology

## 2023-12-13 ENCOUNTER — Ambulatory Visit

## 2023-12-13 NOTE — Telephone Encounter (Signed)
 Updated form signed by Dr. Lola.  Faxed 12/13/23.

## 2023-12-16 ENCOUNTER — Ambulatory Visit

## 2023-12-18 ENCOUNTER — Encounter: Admitting: Obstetrics and Gynecology

## 2023-12-20 ENCOUNTER — Ambulatory Visit

## 2023-12-20 ENCOUNTER — Encounter: Payer: Self-pay | Admitting: *Deleted

## 2023-12-25 ENCOUNTER — Encounter: Admitting: Obstetrics and Gynecology

## 2024-01-08 ENCOUNTER — Ambulatory Visit: Admitting: Obstetrics and Gynecology

## 2024-01-08 ENCOUNTER — Other Ambulatory Visit: Payer: Self-pay

## 2024-01-08 VITALS — BP 154/113 | HR 90 | Ht 70.0 in | Wt 282.3 lb

## 2024-01-08 DIAGNOSIS — I10 Essential (primary) hypertension: Secondary | ICD-10-CM | POA: Diagnosis not present

## 2024-01-08 DIAGNOSIS — E1169 Type 2 diabetes mellitus with other specified complication: Secondary | ICD-10-CM

## 2024-01-08 MED ORDER — SLYND 4 MG PO TABS: 1.0000 | ORAL_TABLET | Freq: Every day | ORAL | 3 refills | Status: AC

## 2024-01-08 MED ORDER — LISINOPRIL 20 MG PO TABS: 20.0000 mg | ORAL_TABLET | Freq: Every day | ORAL | 1 refills | Status: AC

## 2024-01-08 NOTE — Progress Notes (Signed)
 Patient wants pills

## 2024-01-08 NOTE — Progress Notes (Signed)
 Post Partum Visit Note  Joanne Waller is a 38 y.o. [phone] female who presents for a postpartum visit. She is 5 weeks postpartum following a normal spontaneous vaginal delivery.  I have fully reviewed the prenatal and intrapartum course. The delivery was at 37 gestational weeks.  Anesthesia: epidural. Postpartum course has been uncomplicated. Baby is doing well. Baby is feeding by bottle - Similac Alimentum. Bleeding thin lochia. Bowel function is normal. Bladder function is normal. Patient is not sexually active. Contraception method is none. Postpartum depression screening: negative.   The pregnancy intention screening data noted above was reviewed. Potential methods of contraception were discussed. The patient elected to proceed with No data recorded.   Edinburgh Postnatal Depression Scale - 01/08/24 1539       Edinburgh Postnatal Depression Scale:  In the Past 7 Days   I have been able to laugh and see the funny side of things. 0    I have looked forward with enjoyment to things. 0    I have blamed myself unnecessarily when things went wrong. 0    I have been anxious or worried for no good reason. 0    I have felt scared or panicky for no good reason. 0    Things have been getting on top of me. 3    I have been so unhappy that I have had difficulty sleeping. 0    I have felt sad or miserable. 0    I have been so unhappy that I have been crying. 2    The thought of harming myself has occurred to me. 0    Edinburgh Postnatal Depression Scale Total 5          Health Maintenance Due  Topic Date Due   FOOT EXAM  Never done   OPHTHALMOLOGY EXAM  Never done   Diabetic kidney evaluation - Urine ACR  Never done   Pneumococcal Vaccine (1 of 2 - PCV) Never done   Hepatitis B Vaccines 19-59 Average Risk (1 of 3 - 19+ 3-dose series) Never done   HPV VACCINES (1 - 3-dose SCDM series) Never done   Influenza Vaccine  Never done   COVID-19 Vaccine (1 - 2025-26 season) Never done     The following portions of the patient's history were reviewed and updated as appropriate: allergies, current medications, past family history, past medical history, past social history, past surgical history, and problem list.  Review of Systems Pertinent items are noted in HPI.  Objective:  BP (!) 159/119   Pulse 91   Ht 5' 10 (1.778 m)   Wt 282 lb 4.8 oz (128.1 kg)   Breastfeeding No   BMI 40.51 kg/m    General:  alert, cooperative, and no distress  GU exam:  not indicated       Assessment:    1. Postpartum care and examination Will start POP. If this doesn't work well for her she may consider Nexplanon . We reviewed risks/benefits of Nexplanon  vs POP.   2. Chronic hypertension Will switch from Nifed d/t HA over to Lisinopril . Encouraged PM dosing. Discussed CMP in 2 weeks.  She will also make f/u appt with PCP. Will send message to PCP.   3. Type 2 diabetes mellitus with other specified complication, unspecified whether long term insulin  use (HCC)    Plan:   Essential components of care per ACOG recommendations:  1.  Mood and well being: Patient with negative depression screening today. Reviewed local resources for  support.  - Patient tobacco use? No.   - hx of drug use? No.    2. Infant care and feeding:  -Patient currently breastmilk feeding? No.  -Social determinants of health (SDOH) reviewed in EPIC. No concerns  3. Sexuality, contraception and birth spacing - Patient does not want a pregnancy in the next year.  Desired family size is 3 children but might want one more.  - Reviewed reproductive life planning. Reviewed contraceptive methods based on pt preferences and effectiveness.  Patient desired Oral Contraceptive today.   - Discussed birth spacing of 18 months  4. Sleep and fatigue -Encouraged family/partner/community support of 4 hrs of uninterrupted sleep to help with mood and fatigue  5. Physical Recovery  - Discussed patients delivery and  complications. She describes her labor as good. - Patient had a Vaginal, no problems at delivery. Patient had a 1st degree laceration. Perineal healing reviewed. Patient expressed understanding - Patient has urinary incontinence? No. - Patient is safe to resume physical and sexual activity  6.  Health Maintenance - HM due items addressed Yes - Last pap smear  Diagnosis  Date Value Ref Range Status  05/13/2020   Final   - Negative for intraepithelial lesion or malignancy (NILM)   Pap smear not done at today's visit.  -Breast Cancer screening indicated? No.   7. Chronic Disease/Pregnancy Condition follow up: CHTN and DM - PCP follow up - CMP in 2 weeks  Vina Solian, MD Center for [facility], [facility]

## 2024-01-22 ENCOUNTER — Other Ambulatory Visit

## 2024-02-12 ENCOUNTER — Other Ambulatory Visit: Payer: Self-pay

## 2024-02-12 DIAGNOSIS — O24111 Pre-existing diabetes mellitus, type 2, in pregnancy, first trimester: Secondary | ICD-10-CM

## 2024-02-12 MED ORDER — DEXCOM G7 SENSOR MISC: 10 refills | Status: AC

## 2024-02-12 NOTE — Telephone Encounter (Signed)
 [facility] called and ask me to re order patients Dexcom and have the sig to say change sensor every 10 days so insurance will be okay with it.

## 2024-02-13 ENCOUNTER — Telehealth: Payer: Self-pay | Admitting: Pharmacy Technician

## 2024-02-13 ENCOUNTER — Other Ambulatory Visit (HOSPITAL_COMMUNITY): Payer: Self-pay

## 2024-02-13 NOTE — Telephone Encounter (Signed)
 Pharmacy Patient Advocate Encounter   Received notification from [facility] that prior authorization for Dexcom G7 Sensor is required/requested.   Insurance verification completed.   The patient is insured through [facility] MEDICAID.   Per test claim: PA required; PA submitted to above mentioned insurance via Latent Key/confirmation #/EOC [id number] Status is pending

## 2024-02-13 NOTE — Telephone Encounter (Signed)
 Pharmacy Patient Advocate Encounter  Received notification from [facility] MEDICAID that Prior Authorization for Dexcom G7 Sensor has been APPROVED from 02/13/2024 to 02/12/2025. Ran test claim, Copay is $0.00. This test claim was processed through [facility]- copay amounts may vary at other pharmacies due to pharmacy/plan contracts, or as the patient moves through the different stages of their insurance plan.   PA #/Case ID/Reference #: [id number]
